# Patient Record
Sex: Female | Born: 1950 | Race: White | Hispanic: No | Marital: Married | Smoking: Never smoker
Health system: Southern US, Community
[De-identification: ages and names within clinical notes are randomized; demographics above are authoritative.]

## PROBLEM LIST (undated history)

## (undated) DIAGNOSIS — F419 Anxiety disorder, unspecified: Secondary | ICD-10-CM

## (undated) DIAGNOSIS — D499 Neoplasm of unspecified behavior of unspecified site: Secondary | ICD-10-CM

## (undated) DIAGNOSIS — G43909 Migraine, unspecified, not intractable, without status migrainosus: Secondary | ICD-10-CM

## (undated) DIAGNOSIS — N200 Calculus of kidney: Secondary | ICD-10-CM

## (undated) DIAGNOSIS — F988 Other specified behavioral and emotional disorders with onset usually occurring in childhood and adolescence: Secondary | ICD-10-CM

## (undated) DIAGNOSIS — E7800 Pure hypercholesterolemia, unspecified: Secondary | ICD-10-CM

## (undated) DIAGNOSIS — E162 Hypoglycemia, unspecified: Secondary | ICD-10-CM

## (undated) DIAGNOSIS — E782 Mixed hyperlipidemia: Secondary | ICD-10-CM

## (undated) DIAGNOSIS — N6019 Diffuse cystic mastopathy of unspecified breast: Secondary | ICD-10-CM

## (undated) DIAGNOSIS — I10 Essential (primary) hypertension: Secondary | ICD-10-CM

## (undated) DIAGNOSIS — Z87442 Personal history of urinary calculi: Secondary | ICD-10-CM

## (undated) DIAGNOSIS — D3A8 Other benign neuroendocrine tumors: Secondary | ICD-10-CM

## (undated) DIAGNOSIS — M8580 Other specified disorders of bone density and structure, unspecified site: Secondary | ICD-10-CM

## (undated) HISTORY — DX: Migraine, unspecified, not intractable, without status migrainosus: G43.909

## (undated) HISTORY — DX: Hypoglycemia, unspecified: E16.2

## (undated) HISTORY — DX: Other specified disorders of bone density and structure, unspecified site: M85.80

## (undated) HISTORY — DX: Mixed hyperlipidemia: E78.2

## (undated) HISTORY — DX: Diffuse cystic mastopathy of unspecified breast: N60.19

## (undated) HISTORY — DX: Calculus of kidney: N20.0

## (undated) HISTORY — PX: LITHOTRIPSY: SUR834

## (undated) HISTORY — DX: Neoplasm of unspecified behavior of unspecified site: D49.9

## (undated) HISTORY — DX: Other specified behavioral and emotional disorders with onset usually occurring in childhood and adolescence: F98.8

## (undated) HISTORY — DX: Essential (primary) hypertension: I10

## (undated) HISTORY — PX: EYE SURGERY: SHX253

## (undated) HISTORY — PX: APPENDECTOMY: SHX54

## (undated) HISTORY — PX: TONSILLECTOMY: SUR1361

## (undated) HISTORY — PX: ABDOMINAL HYSTERECTOMY: SHX81

---

## 2001-02-02 ENCOUNTER — Other Ambulatory Visit: Admission: RE | Admit: 2001-02-02 | Discharge: 2001-02-02 | Payer: Self-pay | Admitting: Family Medicine

## 2002-04-06 ENCOUNTER — Other Ambulatory Visit: Admission: RE | Admit: 2002-04-06 | Discharge: 2002-04-06 | Payer: Self-pay | Admitting: Internal Medicine

## 2002-04-24 LAB — HM MAMMOGRAPHY: HM Mammogram: NORMAL

## 2002-04-25 LAB — FECAL OCCULT BLOOD, GUAIAC: Fecal Occult Blood: NEGATIVE

## 2004-05-11 HISTORY — PX: MASS EXCISION: SHX2000

## 2004-11-02 ENCOUNTER — Ambulatory Visit: Payer: Self-pay | Admitting: Family Medicine

## 2004-11-09 ENCOUNTER — Ambulatory Visit: Payer: Self-pay | Admitting: Unknown Physician Specialty

## 2005-02-08 HISTORY — PX: COLONOSCOPY: SHX174

## 2005-03-11 ENCOUNTER — Encounter (INDEPENDENT_AMBULATORY_CARE_PROVIDER_SITE_OTHER): Payer: Self-pay | Admitting: Internal Medicine

## 2005-03-11 LAB — CONVERTED CEMR LAB

## 2005-04-29 ENCOUNTER — Ambulatory Visit: Payer: Self-pay | Admitting: Family Medicine

## 2005-06-11 LAB — HM COLONOSCOPY: HM Colonoscopy: ABNORMAL

## 2005-06-28 ENCOUNTER — Ambulatory Visit: Payer: Self-pay | Admitting: Unknown Physician Specialty

## 2005-07-06 ENCOUNTER — Ambulatory Visit: Payer: Self-pay | Admitting: Family Medicine

## 2006-02-28 ENCOUNTER — Ambulatory Visit: Payer: Self-pay | Admitting: Family Medicine

## 2006-05-18 ENCOUNTER — Ambulatory Visit: Payer: Self-pay | Admitting: Family Medicine

## 2006-08-22 ENCOUNTER — Ambulatory Visit: Payer: Self-pay | Admitting: Family Medicine

## 2006-10-07 ENCOUNTER — Ambulatory Visit: Payer: Self-pay | Admitting: Family Medicine

## 2007-08-22 ENCOUNTER — Ambulatory Visit: Payer: Self-pay | Admitting: Family Medicine

## 2007-08-22 DIAGNOSIS — F988 Other specified behavioral and emotional disorders with onset usually occurring in childhood and adolescence: Secondary | ICD-10-CM | POA: Insufficient documentation

## 2007-08-22 DIAGNOSIS — I10 Essential (primary) hypertension: Secondary | ICD-10-CM | POA: Insufficient documentation

## 2007-08-22 DIAGNOSIS — E782 Mixed hyperlipidemia: Secondary | ICD-10-CM

## 2007-09-08 ENCOUNTER — Ambulatory Visit: Payer: Self-pay | Admitting: Internal Medicine

## 2007-09-13 LAB — CONVERTED CEMR LAB
ALT: 33 units/L (ref 0–35)
Calcium: 8.8 mg/dL (ref 8.4–10.5)
Chloride: 106 meq/L (ref 96–112)
Direct LDL: 124.9 mg/dL
GFR calc Af Amer: 83 mL/min
GFR calc non Af Amer: 69 mL/min
Sodium: 140 meq/L (ref 135–145)
Total CHOL/HDL Ratio: 4.1
VLDL: 40 mg/dL (ref 0–40)

## 2007-09-14 ENCOUNTER — Encounter (INDEPENDENT_AMBULATORY_CARE_PROVIDER_SITE_OTHER): Payer: Self-pay | Admitting: Internal Medicine

## 2007-09-14 DIAGNOSIS — N6019 Diffuse cystic mastopathy of unspecified breast: Secondary | ICD-10-CM

## 2007-09-14 DIAGNOSIS — E162 Hypoglycemia, unspecified: Secondary | ICD-10-CM

## 2007-09-14 DIAGNOSIS — N200 Calculus of kidney: Secondary | ICD-10-CM

## 2007-09-14 DIAGNOSIS — J309 Allergic rhinitis, unspecified: Secondary | ICD-10-CM

## 2007-09-14 DIAGNOSIS — J45909 Unspecified asthma, uncomplicated: Secondary | ICD-10-CM | POA: Insufficient documentation

## 2007-09-14 DIAGNOSIS — G43909 Migraine, unspecified, not intractable, without status migrainosus: Secondary | ICD-10-CM | POA: Insufficient documentation

## 2007-09-15 ENCOUNTER — Ambulatory Visit: Payer: Self-pay | Admitting: Unknown Physician Specialty

## 2007-09-29 ENCOUNTER — Encounter (INDEPENDENT_AMBULATORY_CARE_PROVIDER_SITE_OTHER): Payer: Self-pay | Admitting: *Deleted

## 2007-10-02 ENCOUNTER — Ambulatory Visit: Payer: Self-pay | Admitting: Family Medicine

## 2007-10-02 ENCOUNTER — Encounter (INDEPENDENT_AMBULATORY_CARE_PROVIDER_SITE_OTHER): Payer: Self-pay | Admitting: Internal Medicine

## 2007-10-02 DIAGNOSIS — M899 Disorder of bone, unspecified: Secondary | ICD-10-CM | POA: Insufficient documentation

## 2007-10-02 DIAGNOSIS — M949 Disorder of cartilage, unspecified: Secondary | ICD-10-CM

## 2007-11-03 ENCOUNTER — Telehealth (INDEPENDENT_AMBULATORY_CARE_PROVIDER_SITE_OTHER): Payer: Self-pay | Admitting: Internal Medicine

## 2007-12-04 ENCOUNTER — Telehealth (INDEPENDENT_AMBULATORY_CARE_PROVIDER_SITE_OTHER): Payer: Self-pay | Admitting: Internal Medicine

## 2007-12-06 ENCOUNTER — Telehealth (INDEPENDENT_AMBULATORY_CARE_PROVIDER_SITE_OTHER): Payer: Self-pay | Admitting: Internal Medicine

## 2007-12-14 ENCOUNTER — Telehealth: Payer: Self-pay | Admitting: Family Medicine

## 2008-01-01 ENCOUNTER — Telehealth (INDEPENDENT_AMBULATORY_CARE_PROVIDER_SITE_OTHER): Payer: Self-pay | Admitting: Internal Medicine

## 2008-01-01 ENCOUNTER — Telehealth: Payer: Self-pay | Admitting: Family Medicine

## 2008-06-26 ENCOUNTER — Ambulatory Visit: Payer: Self-pay | Admitting: Family Medicine

## 2008-06-28 ENCOUNTER — Encounter (INDEPENDENT_AMBULATORY_CARE_PROVIDER_SITE_OTHER): Payer: Self-pay | Admitting: Internal Medicine

## 2008-07-02 ENCOUNTER — Encounter (INDEPENDENT_AMBULATORY_CARE_PROVIDER_SITE_OTHER): Payer: Self-pay | Admitting: Internal Medicine

## 2008-07-25 ENCOUNTER — Ambulatory Visit: Payer: Self-pay | Admitting: Family Medicine

## 2008-08-05 ENCOUNTER — Telehealth: Payer: Self-pay | Admitting: Family Medicine

## 2008-08-28 ENCOUNTER — Telehealth (INDEPENDENT_AMBULATORY_CARE_PROVIDER_SITE_OTHER): Payer: Self-pay | Admitting: Internal Medicine

## 2008-08-28 ENCOUNTER — Ambulatory Visit: Payer: Self-pay | Admitting: Family Medicine

## 2008-08-29 ENCOUNTER — Ambulatory Visit: Payer: Self-pay | Admitting: Family Medicine

## 2008-08-29 LAB — CONVERTED CEMR LAB
ALT: 31 units/L (ref 0–35)
AST: 26 units/L (ref 0–37)
BUN: 15 mg/dL (ref 6–23)
Chloride: 103 meq/L (ref 96–112)
Direct LDL: 111.5 mg/dL
GFR calc non Af Amer: 69 mL/min
Potassium: 4.4 meq/L (ref 3.5–5.1)
Sodium: 141 meq/L (ref 135–145)
VLDL: 41 mg/dL — ABNORMAL HIGH (ref 0–40)

## 2009-04-15 ENCOUNTER — Telehealth (INDEPENDENT_AMBULATORY_CARE_PROVIDER_SITE_OTHER): Payer: Self-pay | Admitting: Internal Medicine

## 2009-04-16 ENCOUNTER — Ambulatory Visit: Payer: Self-pay | Admitting: Internal Medicine

## 2009-04-17 LAB — CONVERTED CEMR LAB: ALT: 28 units/L (ref 0–35)

## 2009-04-18 ENCOUNTER — Telehealth (INDEPENDENT_AMBULATORY_CARE_PROVIDER_SITE_OTHER): Payer: Self-pay | Admitting: Internal Medicine

## 2009-04-18 LAB — CONVERTED CEMR LAB
Direct LDL: 115.5 mg/dL
Total CHOL/HDL Ratio: 4
Triglycerides: 247 mg/dL — ABNORMAL HIGH (ref 0.0–149.0)

## 2009-05-21 ENCOUNTER — Ambulatory Visit: Payer: Self-pay | Admitting: Specialist

## 2009-09-03 ENCOUNTER — Ambulatory Visit: Payer: Self-pay | Admitting: Family Medicine

## 2009-09-08 LAB — CONVERTED CEMR LAB
Cholesterol: 194 mg/dL (ref 0–200)
HDL: 55.7 mg/dL (ref >39.00)
Triglycerides: 215 mg/dL — ABNORMAL HIGH (ref 0.0–149.0)
VLDL: 43 mg/dL — ABNORMAL HIGH (ref 0.0–40.0)

## 2010-03-24 ENCOUNTER — Ambulatory Visit: Payer: Self-pay | Admitting: Family Medicine

## 2010-03-25 LAB — CONVERTED CEMR LAB
ALT: 29 units/L (ref 0–35)
AST: 30 units/L (ref 0–37)
HDL: 60.1 mg/dL (ref >39.00)
Total CHOL/HDL Ratio: 4
Triglycerides: 272 mg/dL — ABNORMAL HIGH (ref 0.0–149.0)
VLDL: 54.4 mg/dL — ABNORMAL HIGH (ref 0.0–40.0)

## 2010-04-08 ENCOUNTER — Ambulatory Visit: Payer: Self-pay | Admitting: Family Medicine

## 2010-05-14 ENCOUNTER — Ambulatory Visit: Payer: Self-pay | Admitting: Unknown Physician Specialty

## 2010-08-13 ENCOUNTER — Ambulatory Visit: Payer: Self-pay | Admitting: Family Medicine

## 2010-08-17 LAB — CONVERTED CEMR LAB
ALT: 29 units/L (ref 0–35)
AST: 29 units/L (ref 0–37)
Total CHOL/HDL Ratio: 4

## 2010-09-08 ENCOUNTER — Encounter: Payer: Self-pay | Admitting: Family Medicine

## 2010-11-10 NOTE — Assessment & Plan Note (Signed)
Summary: 30 MIN APPT F/U LABWORK/CLE   Vital Signs:  Patient profile:   60 year old female Height:      66 inches Weight:      148.25 pounds BMI:     24.01 Temp:     98.1 degrees F oral Pulse rate:   72 / minute Pulse rhythm:   regular BP sitting:   118 / 70  (left arm) Cuff size:   regular  Vitals Entered By: Lewanda Rife LPN (April 08, 2010 3:46 PM) CC: f/u after labs   History of Present Illness: here for f/u after chol labs  cholesterol did go up   wt is stable  good bp at 118/70 today  lipids are higher than usual on crestor with trig 277 and HDL 60 and LDL 131 (up from 110)  eats red meat once per week  no fried food as a rule  occ fast food on off -- sometimes fried fish -- not often  no butter / some cheese / no ice cream  uses promise spread and skim milk  does like shrimp -- eats quite a bit of it  no breakfast meats    long hx of high chol -- was extremely high at age 18  mother and cousin had it  young MIs in family -- some extended relatives i 72s   did use krill oil instead of fish oil for 1 month  was back on regular fish oil for 2 wk before blood test   diet is good overall   some CAD in family    Allergies: 1)  ! Codeine 2)  ! 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 Grandmother had diabetes Mat grandmother died at 76 with coronary artery disease Mat aunts and uncles also had coronary artery disease Breast/Ovarian/Uterine cancer - none Colon cancer + father  some young heart dz in family -- extended   Review of Systems General:  Denies fatigue, loss of appetite, and malaise. Eyes:  Denies eye irritation. CV:  Denies chest pain or discomfort, lightheadness, and palpitations. Resp:  Denies cough and wheezing. GI:  Denies indigestion, loss of appetite, and nausea. MS:  Denies joint pain, muscle aches, and cramps. Derm:  Denies lesion(s), poor wound healing, and rash. Neuro:  Denies numbness and tingling. Endo:  Denies excessive thirst and excessive urination. Heme:  Denies abnormal bruising and bleeding.  Physical Exam  General:  Well-developed,well-nourished,in no acute distress; alert,appropriate and cooperative throughout examination Head:  normocephalic, atraumatic, and no abnormalities observed.   Eyes:  vision grossly intact, pupils equal, pupils round, and pupils reactive to light.   Neck:  supple with full rom and no masses or thyromegally, no JVD or carotid bruit  Lungs:  normal respiratory effort, no  intercostal retractions, no accessory muscle use, and normal breath sounds.   Heart:  normal rate, regular rhythm, and no murmur.   Msk:  No deformity or scoliosis noted of thoracic or lumbar spine.   Extremities:  no edema in either lower leg Neurologic:  gait normal and DTRs symmetrical and normal.   Skin:  Intact without suspicious lesions or rashes Cervical Nodes:  No lymphadenopathy noted Psych:  normal affect, talkative and pleasant    Impression & Recommendations:  Problem # 1:  HYPERLIPIDEMIA (ICD-272.2) Assessment Deteriorated  hx of very high chol that is inc lately on crestor rev diet in detail- suspect too much shellfish and red meat  will change diet - see inst re check 2 mo - if not imp will inc crestor to max of 40 mg and re- eval  also enc exercise  Her updated medication list for this problem includes:    Crestor 20 Mg Tabs (Rosuvastatin calcium) .Marland Kitchen... Take 1 and 1/2 once daily for cholesterol  Labs Reviewed: SGOT: 30 (03/24/2010)   SGPT: 29 (03/24/2010)   HDL:60.10 (03/24/2010), 55.70 (09/03/2009)  LDL:DEL (08/29/2008), DEL (09/08/2007)  Chol:230 (03/24/2010), 194 (09/03/2009)  Trig:272.0 (03/24/2010), 215.0 (09/03/2009)  Complete Medication List: 1)  Vivelle-dot 0.075 Mg/24hr Pttw (Estradiol) .... Use one patch twice a week 2)  Macrobid 100 Mg Caps (Nitrofurantoin monohyd macro) .... As needed 3)  Fish Oil 1200 Mg Caps (Omega-3 fatty acids) .... Take 1 capsule by mouth two times a day 4)  Effexor 75 Mg Tabs (Venlafaxine hcl) .... Take 1 tablet by mouth once a day 5)  Multivitamins Tabs (Multiple vitamin) .... Take one by mouth once a day 6)  Co Q-10 30-5 Mg-unit Caps (Coenzyme q10-vitamin e) .... Take 1 capsule by mouth once a day 7)  Amlodipine Besylate 5 Mg Tabs (Amlodipine besylate) .... Take one by mouth daily 8)  Xalatan 0.005 % Soln (Latanoprost) .... Use one drop in right eye daily 9)  Vit D-3 1000mg   .... Take one by mouth daily 10)  Cetrizine  Hydrochloride 10 Mg  .... Take one by mouth daily 11)  Crestor 20 Mg Tabs (Rosuvastatin calcium) .... Take 1 and 1/2 once daily for cholesterol  Patient Instructions: 1)  you can raise your HDL (good cholesterol) by increasing exercise and eating omega 3 fatty acid supplement like fish oil or flax seed oil over the counter 2)  you can lower LDL (bad cholesterol) by limiting saturated fats in diet like red meat, fried foods, egg yolks, fatty breakfast meats, high fat dairy products and shellfish  3)  stop eating shellfish and cut portions of cheese and try to limit your red meat to once per month  4)  schedule fasting labs in 2-3 months lipid/ast/alt 272  5)  if not improved - will consider inc crestor to 40 mg   Current Allergies (reviewed today): ! CODEINE ! VYTORIN

## 2010-11-10 NOTE — Miscellaneous (Signed)
Summary: flu vaccine  Clinical Lists Changes  Observations: Added new observation of FLU VAX: Historical received at [facility] #[id number] (09/02/2010 11:37)      Immunization History:  Influenza Immunization History:    Influenza:  historical received at [facility] #[id number] (09/02/2010)

## 2011-04-03 ENCOUNTER — Other Ambulatory Visit: Payer: Self-pay | Admitting: Family Medicine

## 2011-04-05 NOTE — Telephone Encounter (Signed)
Patient not seen in one year please advise

## 2011-04-06 ENCOUNTER — Other Ambulatory Visit: Payer: Self-pay | Admitting: Family Medicine

## 2011-04-06 NOTE — Telephone Encounter (Signed)
Go ahead and refil and then make f/u for this summer

## 2011-04-06 NOTE — Telephone Encounter (Signed)
Medication phoned to [facility] as instructed. Addl' note was added for pt to call for f/u appt this summer.

## 2011-04-07 NOTE — Telephone Encounter (Signed)
Spoke with pharmacist at [facility] and they did receive refills sent on 04/03/11. Said to please disregard this request. Not sure why did not go electronically for denial of todays refill.

## 2011-05-10 ENCOUNTER — Other Ambulatory Visit: Payer: Self-pay | Admitting: *Deleted

## 2011-05-10 MED ORDER — ROSUVASTATIN CALCIUM 20 MG PO TABS: ORAL_TABLET | ORAL | Status: DC

## 2011-05-10 NOTE — Telephone Encounter (Signed)
Last refilled 04/12/2011, patient last seen 04/08/2010.  Please advise.

## 2011-05-10 NOTE — Telephone Encounter (Signed)
Patient notified. She was in her car on her cell phone and says that she will call back to schedule appt.;

## 2011-05-10 NOTE — Telephone Encounter (Signed)
Left v/m at pt's home and cell # for pt to call back to schedule appt with Dr Milinda Antis this fall.

## 2011-05-10 NOTE — Telephone Encounter (Signed)
Ok to refil sched appt with me in the fall please  Will send electronically

## 2011-07-06 ENCOUNTER — Other Ambulatory Visit: Payer: Self-pay | Admitting: Family Medicine

## 2011-07-06 NOTE — Telephone Encounter (Signed)
Left message at home # with pt's husband and v/m on pt's cell for pt to call back. Needs to make f/u appt.

## 2011-07-08 ENCOUNTER — Telehealth: Payer: Self-pay | Admitting: *Deleted

## 2011-07-08 MED ORDER — ZOSTER VACCINE LIVE 19400 UNT/0.65ML ~~LOC~~ SOLR: 0.6500 mL | Freq: Once | SUBCUTANEOUS | Status: DC

## 2011-07-08 NOTE — Telephone Encounter (Signed)
Pharmacy requesting order for zostavax injection

## 2011-07-08 NOTE — Telephone Encounter (Signed)
Px written for call in   

## 2011-07-09 NOTE — Telephone Encounter (Signed)
rx called to pharmacy 

## 2011-07-15 ENCOUNTER — Encounter: Payer: Self-pay | Admitting: Family Medicine

## 2011-07-15 NOTE — Telephone Encounter (Signed)
Pt has already scheduled appt 07/19/11 with Dr Milinda Antis.

## 2011-07-16 ENCOUNTER — Encounter: Payer: Self-pay | Admitting: Family Medicine

## 2011-07-19 ENCOUNTER — Encounter: Payer: Self-pay | Admitting: Family Medicine

## 2011-07-19 ENCOUNTER — Ambulatory Visit (INDEPENDENT_AMBULATORY_CARE_PROVIDER_SITE_OTHER): Payer: BC Managed Care – PPO | Admitting: Family Medicine

## 2011-07-19 DIAGNOSIS — J309 Allergic rhinitis, unspecified: Secondary | ICD-10-CM

## 2011-07-19 DIAGNOSIS — E782 Mixed hyperlipidemia: Secondary | ICD-10-CM

## 2011-07-19 DIAGNOSIS — I10 Essential (primary) hypertension: Secondary | ICD-10-CM

## 2011-07-19 MED ORDER — FLUTICASONE PROPIONATE 50 MCG/ACT NA SUSP: 2.0000 | Freq: Every day | NASAL | Status: DC

## 2011-07-19 MED ORDER — ROSUVASTATIN CALCIUM 20 MG PO TABS: ORAL_TABLET | ORAL | Status: DC

## 2011-07-19 MED ORDER — AMLODIPINE BESYLATE 5 MG PO TABS: 5.0000 mg | ORAL_TABLET | Freq: Every day | ORAL | Status: DC

## 2011-07-19 NOTE — Progress Notes (Signed)
Subjective:    Patient ID: Sharon Hester, female    DOB: 06-11-1951, 60 y.o.   MRN: [id number]  HPI Here for f/u of HTN and hyperlipidemia and new problem of ear fullness   Had her flu shot   bp is good 118/70 No cp or palpitations or headache  No change in meds - on norvasc   Lipids are due Have been controlled on crestor - once in a while has a little hip or leg soreness- for few days at a time  Does not last - does not think this is from the med Is very aware of her diet -- watches her sat fats- no red meat or fried foods - is very compliant   Has some ear fullness - both ears but L side worse L nostril is congested also  Going on for 2 weeks  Has allergies this time of year  Is taking her zyrtec  No fever  Clear but thick nasal discharge - perhaps a bit yellow- and feels like she has a cold  Uses afrin at night   Patient Active Problem List  Diagnoses  . HYPOGLYCEMIA  . HYPERLIPIDEMIA  . ADD  . MIGRAINE HEADACHE  . HYPERTENSION  . ALLERGIC RHINITIS  . ASTHMA  . RENAL CALCULUS  . FIBROCYSTIC BREAST DISEASE  . OSTEOPENIA   Past Medical History  Diagnosis Date  . Allergic rhinitis   . Asthma   . Osteopenia   . Attention deficit disorder without mention of hyperactivity   . Diffuse cystic mastopathy   . Mixed hyperlipidemia   . Unspecified essential hypertension   . Hypoglycemia, unspecified   . Migraine, unspecified, without mention of intractable migraine without mention of status migrainosus   . Calculus of kidney   . Precancerous lesion   . Cataract    Past Surgical History  Procedure Date  . Eye surgery     multiple; s/p childhood trauma  . Appendectomy   . Lithotripsy   . Tonsillectomy   . Laparoscopic hysterectomy 1/04  . 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 Colonoscopy 5/06   History  Substance Use Topics  . Smoking status: Never Smoker   . Smokeless tobacco: Not on file  . Alcohol Use: Not on file   Family History  Problem  Relation Age of Onset  . Diabetes Father   . Colon cancer Father   . Hypertension Father   . Heart attack Father   . Heart attack Mother   . Coronary artery disease Mother   . Pancreatic cancer Mother   . Hyperlipidemia Mother   . Other Brother     Congenital hyperspadias  . Other Brother     loss of kidney function in 1 kidney early in life  . Diabetes      Grandmother  . Coronary artery disease Maternal Grandmother   . Coronary artery disease      Maternal aunts and uncles  . Breast cancer Neg Hx   . Ovarian cancer Neg Hx   . Uterine cancer Neg Hx    Allergies  Allergen Reactions  . Codeine     REACTION: n/v  . Ezetimibe-Simvastatin     REACTION: muscle pain   Current Outpatient Prescriptions on File Prior to Visit  Medication Sig Dispense Refill  . cetirizine (ZYRTEC) 10 MG tablet Take 10 mg by mouth daily.        . cholecalciferol (VITAMIN D) 1000 UNITS tablet Take 1,000 Units by mouth daily.        Marland Kitchen  estradiol (VIVELLE-DOT) 0.075 MG/24HR Place 1 patch onto the skin 2 (two) times a week.        . latanoprost (XALATAN) 0.005 % ophthalmic solution Place 1 drop into the right eye daily.        . Multiple Vitamin (MULTIVITAMIN) tablet Take 1 tablet by mouth daily.        . Omega-3 Fatty Acids (FISH OIL) 1200 MG CAPS Take 1 capsule by mouth 2 (two) times daily.        Marland Kitchen venlafaxine (EFFEXOR) 75 MG tablet Take 75 mg by mouth daily.        Marland Kitchen zoster vaccine live, PF, (ZOSTAVAX) 16109 UNT/0.65ML injection Inject 19,400 Units into the skin once.  1 vial  0      Review of Systems Review of Systems  Constitutional: Negative for fever, appetite change, fatigue and unexpected weight change.  Eyes: Negative for pain and visual disturbance.  ENT pos for runny and stuffy nose/ mild post nasal drip / ear fullness with dec hearing, no st , no facial pain  Respiratory: Negative for cough and shortness of breath.   Cardiovascular: Negative for cp or palpitations    Gastrointestinal:  Negative for nausea, diarrhea and constipation.  Genitourinary: Negative for urgency and frequency.  Skin: Negative for pallor or rash   Neurological: Negative for weakness, light-headedness, numbness and headaches.  Hematological: Negative for adenopathy. Does not bruise/bleed easily.  Psychiatric/Behavioral: Negative for dysphoric mood. The patient is not nervous/anxious.          Objective:   Physical Exam  Constitutional: She appears well-developed and well-nourished. No distress.  HENT:  Head: Normocephalic and atraumatic.       TMs dull bilat with some scarring - no redness Nares are boggy/ mildly congested Post nasal clear drainage No sinus tenderness  Eyes: Conjunctivae and EOM are normal. Pupils are equal, round, and reactive to light. Right eye exhibits no discharge. Left eye exhibits no discharge.  Neck: Normal range of motion. Neck supple. No JVD present. Carotid bruit is not present. No thyromegaly present.  Cardiovascular: Normal rate, regular rhythm, normal heart sounds and intact distal pulses.  Exam reveals no gallop.   Pulmonary/Chest: Effort normal and breath sounds normal. No respiratory distress. She has no wheezes.  Abdominal: Soft. Bowel sounds are normal. She exhibits no distension, no abdominal bruit and no mass. There is no tenderness.  Musculoskeletal: She exhibits no edema and no tenderness.  Lymphadenopathy:    She has no cervical adenopathy.  Neurological: She is alert. She has normal reflexes. Coordination normal.  Skin: Skin is warm and dry. No rash noted. No erythema. No pallor.  Psychiatric: She has a normal mood and affect.          Assessment & Plan:

## 2011-07-19 NOTE — Patient Instructions (Signed)
Schedule fasting labs at check out  Stop afrin - do not use it any more Start flonase nasal spray 2 sprays in each nostril each day  Continue zyrtec  No other changes in medicine  Keep up the good health habit

## 2011-07-22 NOTE — Assessment & Plan Note (Signed)
Labs planned fasting  Rev last labs crestor works well Rev low sat fat diet

## 2011-07-22 NOTE — Assessment & Plan Note (Signed)
bp in fair control with norvasc  No changes Labs planned Disc healthy lifestyle habits

## 2011-07-22 NOTE — Assessment & Plan Note (Signed)
More ear congestion and rhinorrhea this season Cannot r/o viral uri also  Add on flonase ns Stop afrin as she is likely having rebound congestion also  Will update if worse or no imp or fever/facial pain

## 2011-10-13 ENCOUNTER — Other Ambulatory Visit: Payer: Self-pay | Admitting: *Deleted

## 2011-10-13 MED ORDER — VENLAFAXINE HCL 75 MG PO TABS: 75.0000 mg | ORAL_TABLET | Freq: Every day | ORAL | Status: DC

## 2011-10-13 NOTE — Telephone Encounter (Signed)
Previously prescribed by Dr. Harold Hedge, but he has retired. Would you be willing to prescribe?

## 2011-10-13 NOTE — Telephone Encounter (Signed)
Refill request from pharmacy. Ok to refill? Last OV 07/19/11

## 2011-10-13 NOTE — Telephone Encounter (Signed)
Will refill electronically  

## 2011-10-19 ENCOUNTER — Telehealth: Payer: Self-pay | Admitting: Internal Medicine

## 2011-10-19 ENCOUNTER — Other Ambulatory Visit: Payer: BC Managed Care – PPO

## 2011-10-19 DIAGNOSIS — R5383 Other fatigue: Secondary | ICD-10-CM | POA: Insufficient documentation

## 2011-10-19 NOTE — Telephone Encounter (Signed)
Patient notified as instructed by telephone. 

## 2011-10-19 NOTE — Telephone Encounter (Signed)
Will add on thyroid labs  Order done

## 2011-10-19 NOTE — Telephone Encounter (Signed)
Patient called and stated she has all the symptoms of hypothyroid.  She has dry skin, memory issues, fatigued all the time, weight gain.  She was watching a program on TV about thyroid and she stated they said when hands hang and if palms point back that's a sign and also doughy features and she states she has fatty tissue.  She is coming in tomorrow for labs because she missed her October appointment to draw labs and she wanted to know if you could do an intensive lab of her thyroid.  Please advise.

## 2011-10-20 ENCOUNTER — Other Ambulatory Visit (INDEPENDENT_AMBULATORY_CARE_PROVIDER_SITE_OTHER): Payer: BC Managed Care – PPO

## 2011-10-20 DIAGNOSIS — R5383 Other fatigue: Secondary | ICD-10-CM

## 2011-10-20 DIAGNOSIS — E782 Mixed hyperlipidemia: Secondary | ICD-10-CM

## 2011-10-20 DIAGNOSIS — I10 Essential (primary) hypertension: Secondary | ICD-10-CM

## 2011-10-20 LAB — COMPREHENSIVE METABOLIC PANEL
AST: 29 U/L (ref 0–37)
Albumin: 4.2 g/dL (ref 3.5–5.2)
BUN: 18 mg/dL (ref 6–23)
Calcium: 8.9 mg/dL (ref 8.4–10.5)
Chloride: 107 mEq/L (ref 96–112)
Creatinine, Ser: 0.9 mg/dL (ref 0.4–1.2)
Glucose, Bld: 93 mg/dL (ref 70–99)
Potassium: 4.6 mEq/L (ref 3.5–5.1)

## 2011-10-20 LAB — CBC WITH DIFFERENTIAL/PLATELET
Basophils Relative: 0.5 % (ref 0.0–3.0)
HCT: 43 % (ref 36.0–46.0)
Hemoglobin: 14.7 g/dL (ref 12.0–15.0)
Lymphocytes Relative: 25.1 % (ref 12.0–46.0)
Lymphs Abs: 1.7 10*3/uL (ref 0.7–4.0)
Monocytes Relative: 5.6 % (ref 3.0–12.0)
Neutro Abs: 4.4 10*3/uL (ref 1.4–7.7)
RBC: 5.05 Mil/uL (ref 3.87–5.11)

## 2011-10-20 LAB — LIPID PANEL
Cholesterol: 208 mg/dL — ABNORMAL HIGH (ref 0–200)
HDL: 63.7 mg/dL (ref >39.00)
Triglycerides: 164 mg/dL — ABNORMAL HIGH (ref 0.0–149.0)

## 2011-10-20 LAB — TSH: TSH: 1.82 u[IU]/mL (ref 0.35–5.50)

## 2011-10-21 LAB — LDL CHOLESTEROL, DIRECT: Direct LDL: 117.1 mg/dL

## 2011-11-02 ENCOUNTER — Other Ambulatory Visit: Payer: Self-pay | Admitting: *Deleted

## 2011-11-02 MED ORDER — ROSUVASTATIN CALCIUM 20 MG PO TABS: ORAL_TABLET | ORAL | Status: DC

## 2012-09-11 ENCOUNTER — Other Ambulatory Visit: Payer: Self-pay | Admitting: *Deleted

## 2012-09-11 NOTE — Telephone Encounter (Signed)
Please schedule a winter follow up and refil until then thanks

## 2012-09-11 NOTE — Telephone Encounter (Signed)
No recent appt or labs and no future appt, ok to refill? 

## 2012-09-12 NOTE — Telephone Encounter (Signed)
Left voicemail requesting pt to call office, will try to call later 

## 2012-09-13 MED ORDER — AMLODIPINE BESYLATE 5 MG PO TABS: 5.0000 mg | ORAL_TABLET | Freq: Every day | ORAL | Status: DC

## 2012-09-13 NOTE — Telephone Encounter (Signed)
Scheduled appt for 09/29/12 and refilled med

## 2012-09-29 ENCOUNTER — Ambulatory Visit: Payer: BC Managed Care – PPO | Admitting: Family Medicine

## 2012-10-06 ENCOUNTER — Encounter: Payer: Self-pay | Admitting: Family Medicine

## 2012-10-06 ENCOUNTER — Ambulatory Visit (INDEPENDENT_AMBULATORY_CARE_PROVIDER_SITE_OTHER): Payer: BC Managed Care – PPO | Admitting: Family Medicine

## 2012-10-06 VITALS — BP 130/76 | HR 80 | Temp 97.8°F | Ht 66.0 in | Wt 149.5 lb

## 2012-10-06 DIAGNOSIS — E782 Mixed hyperlipidemia: Secondary | ICD-10-CM

## 2012-10-06 DIAGNOSIS — I10 Essential (primary) hypertension: Secondary | ICD-10-CM

## 2012-10-06 LAB — COMPREHENSIVE METABOLIC PANEL
AST: 28 U/L (ref 0–37)
Albumin: 4 g/dL (ref 3.5–5.2)
Alkaline Phosphatase: 54 U/L (ref 39–117)
BUN: 16 mg/dL (ref 6–23)
Creatinine, Ser: 0.8 mg/dL (ref 0.4–1.2)
Potassium: 4.2 mEq/L (ref 3.5–5.1)

## 2012-10-06 LAB — LIPID PANEL
Cholesterol: 180 mg/dL (ref 0–200)
Total CHOL/HDL Ratio: 4
Triglycerides: 202 mg/dL — ABNORMAL HIGH (ref 0.0–149.0)
VLDL: 40.4 mg/dL — ABNORMAL HIGH (ref 0.0–40.0)

## 2012-10-06 LAB — CBC WITH DIFFERENTIAL/PLATELET
Basophils Relative: 0.8 % (ref 0.0–3.0)
Eosinophils Absolute: 0.2 10*3/uL (ref 0.0–0.7)
MCHC: 33.7 g/dL (ref 30.0–36.0)
MCV: 84.5 fl (ref 78.0–100.0)
Monocytes Absolute: 0.4 10*3/uL (ref 0.1–1.0)
Neutrophils Relative %: 57.5 % (ref 43.0–77.0)
RBC: 5.14 Mil/uL — ABNORMAL HIGH (ref 3.87–5.11)
RDW: 13.2 % (ref 11.5–14.6)

## 2012-10-06 MED ORDER — PNEUMOCOCCAL VAC POLYVALENT 25 MCG/0.5ML IJ INJ: 0.5000 mL | INJECTION | Freq: Once | INTRAMUSCULAR | Status: DC

## 2012-10-06 MED ORDER — VENLAFAXINE HCL 75 MG PO TABS: 75.0000 mg | ORAL_TABLET | Freq: Every day | ORAL | Status: DC

## 2012-10-06 MED ORDER — AMLODIPINE BESYLATE 5 MG PO TABS: 5.0000 mg | ORAL_TABLET | Freq: Every day | ORAL | Status: DC

## 2012-10-06 MED ORDER — ROSUVASTATIN CALCIUM 20 MG PO TABS: ORAL_TABLET | ORAL | Status: DC

## 2012-10-06 MED ORDER — ESTRADIOL 0.075 MG/24HR TD PTTW
1.0000 | MEDICATED_PATCH | TRANSDERMAL | Status: DC
Start: 2012-10-06 — End: 2012-10-31

## 2012-10-06 NOTE — Progress Notes (Signed)
Subjective:    Patient ID: Sharon Hester, female    DOB: 1951-04-06, 60 y.o.   MRN: [id number]  HPI Here for f/u of chronic medical problems  Feeling fine -no problems   Has [place] back [place] [employer] [place]    Wt is up 2 lb with good bmi of 24  bp is stable today  No cp or palpitations or headaches or edema  No side effects to medicines  BP Readings from Last 3 Encounters:  10/06/12 130/76  07/19/11 118/70  04/08/10 118/70      Hyperlipidemia  On crestor  Lab Results  Component Value Date   CHOL 208* 10/20/2011   HDL 63.70 10/20/2011   LDLDIRECT 117.1 10/20/2011   TRIG 164.0* 10/20/2011   CHOLHDL 3 10/20/2011   due for labs  Diet has been very good for the most part -just wants to watch calories    Health habits- needs to get back to exercise - walking and weights   On effexor - still works well for her   mammo - that was fall of this year   Wants a pneumovax px   Patient Active Problem List  Diagnosis  . HYPOGLYCEMIA  . HYPERLIPIDEMIA  . ADD  . MIGRAINE HEADACHE  . HYPERTENSION  . ALLERGIC RHINITIS  . ASTHMA  . RENAL CALCULUS  . FIBROCYSTIC BREAST DISEASE  . OSTEOPENIA  . Fatigue   Past Medical History  Diagnosis Date  . Allergic rhinitis   . Asthma   . Osteopenia   . Attention deficit disorder without mention of hyperactivity   . Diffuse cystic mastopathy   . Mixed hyperlipidemia   . Unspecified essential hypertension   . Hypoglycemia, unspecified   . Migraine, unspecified, without mention of intractable migraine without mention of status migrainosus   . Calculus of kidney   . Precancerous lesion   . Cataract    Past Surgical History  Procedure Date  . Eye surgery     multiple; s/p childhood trauma  . Appendectomy   . Lithotripsy   . Tonsillectomy   . Laparoscopic hysterectomy 1/04  . 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 Colonoscopy 5/06   History  Substance Use Topics  . Smoking status: Never Smoker   . Smokeless  tobacco: Not on file  . Alcohol Use: Yes     Comment: occasional   Family History  Problem Relation Age of Onset  . Diabetes Father   . Colon cancer Father   . Hypertension Father   . Heart attack Father   . Heart attack Mother   . Coronary artery disease Mother   . Pancreatic cancer Mother   . Hyperlipidemia Mother   . Other Brother     Congenital hyperspadias  . Other Brother     loss of kidney function in 1 kidney early in life  . Diabetes      Grandmother  . Coronary artery disease Maternal Grandmother   . Coronary artery disease      Maternal aunts and uncles  . Breast cancer Neg Hx   . Ovarian cancer Neg Hx   . Uterine cancer Neg Hx    Allergies  Allergen Reactions  . Codeine     REACTION: n/v  . Ezetimibe-Simvastatin     REACTION: muscle pain   Current Outpatient Prescriptions on File Prior to Visit  Medication Sig Dispense Refill  . amLODipine (NORVASC) 5 MG tablet Take 1 tablet (5 mg total) by mouth daily.  90 tablet  0  . cetirizine (ZYRTEC) 10 MG tablet Take 10 mg by mouth daily.        . cholecalciferol (VITAMIN D) 1000 UNITS tablet Take 1,000 Units by mouth daily.        . Coenzyme Q10 (COQ10) 400 MG CAPS Take 1 capsule by mouth daily.        Marland Kitchen estradiol (VIVELLE-DOT) 0.075 MG/24HR Place 1 patch onto the skin 2 (two) times a week.        . latanoprost (XALATAN) 0.005 % ophthalmic solution Place 1 drop into the right eye daily.        . Multiple Vitamin (MULTIVITAMIN) tablet Take 1 tablet by mouth daily.        . Omega-3 Fatty Acids (FISH OIL) 1200 MG CAPS Take 1 capsule by mouth 2 (two) times daily.       . rosuvastatin (CRESTOR) 20 MG tablet Take one and one half tablets by mouth once daily for cholesterol  135 tablet  3  . venlafaxine (EFFEXOR) 75 MG tablet Take 1 tablet (75 mg total) by mouth daily.  30 tablet  5  . fluticasone (FLONASE) 50 MCG/ACT nasal spray Place 2 sprays into the nose daily.  16 g  11      Review of Systems Review of Systems    Constitutional: Negative for fever, appetite change, fatigue and unexpected weight change.  Eyes: Negative for pain and visual disturbance.  Respiratory: Negative for cough and shortness of breath.   Cardiovascular: Negative for cp or palpitations    Gastrointestinal: Negative for nausea, diarrhea and constipation.  Genitourinary: Negative for urgency and frequency.  Skin: Negative for pallor or rash   Neurological: Negative for weakness, light-headedness, numbness and headaches.  Hematological: Negative for adenopathy. Does not bruise/bleed easily.  Psychiatric/Behavioral: Negative for dysphoric mood. The patient is not nervous/anxious.         Objective:   Physical Exam  Constitutional: She appears well-developed and well-nourished. No distress.  HENT:  Head: Normocephalic and atraumatic.  Right Ear: External ear normal.  Left Ear: External ear normal.  Nose: Nose normal.  Mouth/Throat: Oropharynx is clear and moist.  Eyes: Conjunctivae normal and EOM are normal. Pupils are equal, round, and reactive to light. Right eye exhibits no discharge. Left eye exhibits no discharge. No scleral icterus.  Neck: Normal range of motion. Neck supple. No JVD present. Carotid bruit is not present. No thyromegaly present.  Cardiovascular: Normal rate, regular rhythm, normal heart sounds and intact distal pulses.  Exam reveals no gallop.   Pulmonary/Chest: Effort normal and breath sounds normal. No respiratory distress. She has no wheezes.  Abdominal: Soft. Bowel sounds are normal. She exhibits no distension, no abdominal bruit and no mass. There is no tenderness.  Musculoskeletal: She exhibits no edema and no tenderness.  Lymphadenopathy:    She has no cervical adenopathy.  Neurological: She is alert. She has normal reflexes. No cranial nerve deficit. She exhibits normal muscle tone. Coordination normal.  Skin: Skin is warm and dry. No rash noted. No erythema. No pallor.  Psychiatric: She has a  normal mood and affect.          Assessment & Plan:

## 2012-10-06 NOTE — Assessment & Plan Note (Signed)
Lipids today Not fasting crestor and good diet

## 2012-10-06 NOTE — Assessment & Plan Note (Signed)
bp in fair control at this time  No changes needed  Disc lifstyle change with low sodium diet and exercise  Lab today 

## 2012-10-06 NOTE — Patient Instructions (Addendum)
Labs today  I sent px to the pharmacy  Take care of yourself with healthy diet and exercise

## 2012-10-09 ENCOUNTER — Encounter: Payer: Self-pay | Admitting: *Deleted

## 2012-10-31 ENCOUNTER — Telehealth: Payer: Self-pay | Admitting: Family Medicine

## 2012-10-31 MED ORDER — ESTRADIOL 0.1 MG/24HR TD PTTW: 1.0000 | MEDICATED_PATCH | TRANSDERMAL | Status: DC

## 2012-10-31 NOTE — Telephone Encounter (Signed)
Tonnya from [facility] called regarding pt's RX for Vivelle-Dot 0.075mg  patches.  States that pt was previously on Vivette-Dot 0.1mg  patch and wanted to clarify the dose.  I cannot find documentation that pt was on 0.1mg  dose.  Please advise.

## 2012-10-31 NOTE — Telephone Encounter (Signed)
If that is what she says she was on , go ahead and change it - and change in med list also, thanks

## 2012-10-31 NOTE — Telephone Encounter (Signed)
Changed it on med list and sent in a new Rx for vivelle-Dot 0.1 mg

## 2013-06-26 ENCOUNTER — Other Ambulatory Visit: Payer: Self-pay | Admitting: Family Medicine

## 2013-06-26 NOTE — Telephone Encounter (Signed)
Electronic refill request, no recent/future appt., please advise  

## 2013-06-26 NOTE — Telephone Encounter (Signed)
Please schedule f/u or PE in the winter and refill until then

## 2013-06-28 ENCOUNTER — Other Ambulatory Visit: Payer: Self-pay | Admitting: *Deleted

## 2013-06-28 MED ORDER — ESTRADIOL 0.1 MG/24HR TD PTTW: 1.0000 | MEDICATED_PATCH | TRANSDERMAL | Status: DC

## 2013-10-12 ENCOUNTER — Other Ambulatory Visit: Payer: Self-pay | Admitting: Family Medicine

## 2013-10-12 MED ORDER — ROSUVASTATIN CALCIUM 20 MG PO TABS: ORAL_TABLET | ORAL | Status: DC

## 2013-10-12 MED ORDER — FLUTICASONE PROPIONATE 50 MCG/ACT NA SUSP: 2.0000 | Freq: Every day | NASAL | Status: DC

## 2013-10-12 NOTE — Addendum Note (Signed)
Addended by: Tammi Sou on: 10/12/2013 05:15 PM   Modules accepted: Orders

## 2013-10-17 ENCOUNTER — Other Ambulatory Visit: Payer: Self-pay | Admitting: Family Medicine

## 2013-10-17 NOTE — Telephone Encounter (Signed)
Electronic refill request, please advise  

## 2013-10-17 NOTE — Telephone Encounter (Signed)
Please f/u late spring and refill until then, thanks

## 2013-10-18 NOTE — Telephone Encounter (Signed)
Left detailed message on voicemail. One 90 day refill given.

## 2013-10-26 ENCOUNTER — Other Ambulatory Visit: Payer: Self-pay | Admitting: Family Medicine

## 2013-10-26 NOTE — Telephone Encounter (Signed)
Please schedule f/u and refill until then, thanks 

## 2013-10-26 NOTE — Telephone Encounter (Signed)
Electronic refill request, no recent/future appt. Please advise

## 2013-10-26 NOTE — Telephone Encounter (Signed)
Left message requesting family to call office

## 2013-10-29 ENCOUNTER — Other Ambulatory Visit: Payer: Self-pay | Admitting: Family Medicine

## 2013-10-29 NOTE — Telephone Encounter (Signed)
Pt out of town and requested that I call her tomorrow to schedule a f/u appt

## 2013-10-29 NOTE — Telephone Encounter (Signed)
Last office visit 10/06/2012.  Ok to refill?

## 2013-10-29 NOTE — Telephone Encounter (Signed)
Please schedule f/u and refill until then  

## 2013-10-30 NOTE — Telephone Encounter (Signed)
F/u appt scheduled for 11/20/13 and med refilled

## 2013-10-30 NOTE — Telephone Encounter (Signed)
Rx sent to pharmacy already (duplicate request)

## 2013-11-20 ENCOUNTER — Encounter: Payer: Self-pay | Admitting: Family Medicine

## 2013-11-20 ENCOUNTER — Ambulatory Visit (INDEPENDENT_AMBULATORY_CARE_PROVIDER_SITE_OTHER): Payer: BC Managed Care – PPO | Admitting: Family Medicine

## 2013-11-20 VITALS — BP 118/74 | HR 71 | Temp 97.8°F | Ht 66.0 in | Wt 152.5 lb

## 2013-11-20 DIAGNOSIS — M899 Disorder of bone, unspecified: Secondary | ICD-10-CM

## 2013-11-20 DIAGNOSIS — M949 Disorder of cartilage, unspecified: Secondary | ICD-10-CM

## 2013-11-20 DIAGNOSIS — E782 Mixed hyperlipidemia: Secondary | ICD-10-CM

## 2013-11-20 DIAGNOSIS — I10 Essential (primary) hypertension: Secondary | ICD-10-CM

## 2013-11-20 LAB — CBC WITH DIFFERENTIAL/PLATELET
BASOS PCT: 0.5 % (ref 0.0–3.0)
Basophils Absolute: 0 10*3/uL (ref 0.0–0.1)
EOS ABS: 0.2 10*3/uL (ref 0.0–0.7)
EOS PCT: 3 % (ref 0.0–5.0)
HCT: 45.5 % (ref 36.0–46.0)
HEMOGLOBIN: 14.9 g/dL (ref 12.0–15.0)
LYMPHS PCT: 32.1 % (ref 12.0–46.0)
Lymphs Abs: 2.2 10*3/uL (ref 0.7–4.0)
MCHC: 32.8 g/dL (ref 30.0–36.0)
MCV: 85.8 fl (ref 78.0–100.0)
Monocytes Absolute: 0.3 10*3/uL (ref 0.1–1.0)
Monocytes Relative: 4.5 % (ref 3.0–12.0)
NEUTROS ABS: 4.1 10*3/uL (ref 1.4–7.7)
Neutrophils Relative %: 59.9 % (ref 43.0–77.0)
Platelets: 204 10*3/uL (ref 150.0–400.0)
RBC: 5.3 Mil/uL — AB (ref 3.87–5.11)
RDW: 14.1 % (ref 11.5–14.6)
WBC: 6.8 10*3/uL (ref 4.5–10.5)

## 2013-11-20 LAB — COMPREHENSIVE METABOLIC PANEL
ALBUMIN: 4.3 g/dL (ref 3.5–5.2)
ALT: 31 U/L (ref 0–35)
AST: 33 U/L (ref 0–37)
Alkaline Phosphatase: 56 U/L (ref 39–117)
BUN: 20 mg/dL (ref 6–23)
CALCIUM: 10 mg/dL (ref 8.4–10.5)
CHLORIDE: 104 meq/L (ref 96–112)
CO2: 28 meq/L (ref 19–32)
CREATININE: 0.8 mg/dL (ref 0.4–1.2)
GFR: 78.27 mL/min (ref >60.00)
GLUCOSE: 93 mg/dL (ref 70–99)
POTASSIUM: 4.1 meq/L (ref 3.5–5.1)
Sodium: 142 mEq/L (ref 135–145)
Total Bilirubin: 0.8 mg/dL (ref 0.3–1.2)
Total Protein: 7.3 g/dL (ref 6.0–8.3)

## 2013-11-20 LAB — LIPID PANEL
CHOLESTEROL: 225 mg/dL — AB (ref 0–200)
HDL: 61.3 mg/dL (ref >39.00)
TRIGLYCERIDES: 259 mg/dL — AB (ref 0.0–149.0)
Total CHOL/HDL Ratio: 4
VLDL: 51.8 mg/dL — ABNORMAL HIGH (ref 0.0–40.0)

## 2013-11-20 LAB — LDL CHOLESTEROL, DIRECT: Direct LDL: 132.4 mg/dL

## 2013-11-20 LAB — TSH: TSH: 1.51 u[IU]/mL (ref 0.35–5.50)

## 2013-11-20 MED ORDER — ROSUVASTATIN CALCIUM 20 MG PO TABS: ORAL_TABLET | ORAL | Status: DC

## 2013-11-20 MED ORDER — VENLAFAXINE HCL ER 75 MG PO CP24: ORAL_CAPSULE | ORAL | Status: DC

## 2013-11-20 MED ORDER — AMLODIPINE BESYLATE 5 MG PO TABS: ORAL_TABLET | ORAL | Status: DC

## 2013-11-20 MED ORDER — FLUTICASONE PROPIONATE 50 MCG/ACT NA SUSP: 2.0000 | Freq: Every day | NASAL | Status: DC

## 2013-11-20 MED ORDER — ESTRADIOL 0.1 MG/24HR TD PTTW: 1.0000 | MEDICATED_PATCH | TRANSDERMAL | Status: DC

## 2013-11-20 NOTE — Progress Notes (Signed)
Pre-visit discussion using our clinic review tool. No additional management support is needed unless otherwise documented below in the visit note.  

## 2013-11-20 NOTE — Progress Notes (Signed)
Subjective:    Patient ID: Sharon Hester, female    DOB: 08/24/1951, 63 y.o.   MRN: [id number]  HPI Here for f/u of chronic medical problems   Doing well overall   Thinks she passed a kidney stone yesterday -feels better now/ was a very small one   Hips were hurting lately -that is better  occ knee issues   Has recently quit exercising - she twisted her ankle and was out of commission for    Weight is ok - is stable    bp is stable today  No cp or palpitations or headaches or edema  No side effects to medicines  BP Readings from Last 3 Encounters:  11/20/13 118/74  10/06/12 130/76  07/19/11 118/70     Lab Results  Component Value Date   CHOL 180 10/06/2012   HDL 45.50 10/06/2012   LDLDIRECT 107.8 10/06/2012   TRIG 202.0* 10/06/2012   CHOLHDL 4 10/06/2012    No problems with her medicine  Is overdue for her labs   Mood -on effexor  Took it in the past for headaches  In the future she will want to get off of it  Her daughter lost a child - 57 days old after several miscarriages - and it was quite difficult  Due to that stress will stay on it for now   Also wants to stay on her HRT patch for vasomotor menopause symptoms  She understands cancer risks with this - disc pros and cons and risks   Patient Active Problem List   Diagnosis Date Noted  . Fatigue 10/19/2011  . OSTEOPENIA 10/02/2007  . HYPOGLYCEMIA 09/14/2007  . MIGRAINE HEADACHE 09/14/2007  . ALLERGIC RHINITIS 09/14/2007  . ASTHMA 09/14/2007  . RENAL CALCULUS 09/14/2007  . FIBROCYSTIC BREAST DISEASE 09/14/2007  . HYPERLIPIDEMIA 08/22/2007  . ADD 08/22/2007  . HYPERTENSION 08/22/2007   Past Medical History  Diagnosis Date  . Allergic rhinitis   . Asthma   . Osteopenia   . Attention deficit disorder without mention of hyperactivity   . Diffuse cystic mastopathy   . Mixed hyperlipidemia   . Unspecified essential hypertension   . Hypoglycemia, unspecified   . Migraine, unspecified, without  mention of intractable migraine without mention of status migrainosus   . Calculus of kidney   . Precancerous lesion   . Cataract    Past Surgical History  Procedure Laterality Date  . Eye surgery      multiple; s/p childhood trauma  . Appendectomy    . Lithotripsy    . Tonsillectomy    . Laparoscopic hysterectomy  1/04  . 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 Colonoscopy  5/06   History  Substance Use Topics  . Smoking status: Never Smoker   . Smokeless tobacco: Not on file  . Alcohol Use: Yes     Comment: occasional   Family History  Problem Relation Age of Onset  . Diabetes Father   . Colon cancer Father   . Hypertension Father   . Heart attack Father   . Heart attack Mother   . Coronary artery disease Mother   . Pancreatic cancer Mother   . Hyperlipidemia Mother   . Other Brother     Congenital hyperspadias  . Other Brother     loss of kidney function in 1 kidney early in life  . Diabetes      Grandmother  . Coronary artery disease Maternal Grandmother   . Coronary  artery disease      Maternal aunts and uncles  . Breast cancer Neg Hx   . Ovarian cancer Neg Hx   . Uterine cancer Neg Hx    Allergies  Allergen Reactions  . Codeine     REACTION: n/v  . Ezetimibe-Simvastatin     REACTION: muscle pain   Current Outpatient Prescriptions on File Prior to Visit  Medication Sig Dispense Refill  . cetirizine (ZYRTEC) 10 MG tablet Take 10 mg by mouth daily.        . cholecalciferol (VITAMIN D) 1000 UNITS tablet Take 1,000 Units by mouth daily.        . Coenzyme Q10 (COQ10) 400 MG CAPS Take 1 capsule by mouth daily.        . Flaxseed, Linseed, (FLAX SEED OIL) 1000 MG CAPS Take 1 capsule by mouth daily.      Marland Kitchen latanoprost (XALATAN) 0.005 % ophthalmic solution Place 1 drop into the right eye daily.        . Multiple Vitamin (MULTIVITAMIN) tablet Take 1 tablet by mouth daily.        . Omega-3 Fatty Acids (FISH OIL) 1200 MG CAPS Take 1 capsule by mouth 2  (two) times daily.       . pneumococcal 23 valent vaccine (PNEUMOVAX 23) 25 MCG/0.5ML injection Inject 0.5 mLs into the muscle once.  2.5 mL  0  . venlafaxine (EFFEXOR) 75 MG tablet Take 1 tablet (75 mg total) by mouth daily.  90 tablet  3   No current facility-administered medications on file prior to visit.    Review of Systems Review of Systems  Constitutional: Negative for fever, appetite change, fatigue and unexpected weight change.  Eyes: Negative for pain and visual disturbance.  Respiratory: Negative for cough and shortness of breath.   Cardiovascular: Negative for cp or palpitations    Gastrointestinal: Negative for nausea, diarrhea and constipation.  Genitourinary: Negative for urgency and frequency.  Skin: Negative for pallor or rash   Neurological: Negative for weakness, light-headedness, numbness and headaches.  Hematological: Negative for adenopathy. Does not bruise/bleed easily.  Psychiatric/Behavioral: Negative for dysphoric mood. The patient is not nervous/anxious.         Objective:   Physical Exam  Constitutional: She appears well-developed and well-nourished. No distress.  HENT:  Head: Normocephalic and atraumatic.  Right Ear: External ear normal.  Left Ear: External ear normal.  Nose: Nose normal.  Mouth/Throat: Oropharynx is clear and moist.  Eyes: Conjunctivae and EOM are normal. Pupils are equal, round, and reactive to light. Right eye exhibits no discharge. Left eye exhibits no discharge. No scleral icterus.  Neck: Normal range of motion. Neck supple. No JVD present. No thyromegaly present.  Cardiovascular: Normal rate, regular rhythm, normal heart sounds and intact distal pulses.  Exam reveals no gallop.   Pulmonary/Chest: Effort normal and breath sounds normal. No respiratory distress. She has no wheezes. She has no rales.  Abdominal: Soft. Bowel sounds are normal. She exhibits no distension and no mass. There is no tenderness.  Musculoskeletal: She  exhibits no edema and no tenderness.  Lymphadenopathy:    She has no cervical adenopathy.  Neurological: She is alert. She has normal reflexes. No cranial nerve deficit. She exhibits normal muscle tone. Coordination normal.  Skin: Skin is warm and dry. No rash noted. No erythema. No pallor.  Psychiatric: She has a normal mood and affect.          Assessment & Plan:

## 2013-11-20 NOTE — Patient Instructions (Signed)
Work on [employer] and exercise  Take care of yourself  Labs today

## 2013-11-21 ENCOUNTER — Encounter: Payer: Self-pay | Admitting: *Deleted

## 2013-11-21 LAB — VITAMIN D 25 HYDROXY (VIT D DEFICIENCY, FRACTURES): VIT D 25 HYDROXY: 69 ng/mL (ref 30–89)

## 2013-11-21 NOTE — Assessment & Plan Note (Signed)
BP: 118/74 mmHg  bp in fair control at this time  No changes needed Disc lifstyle change with low sodium diet and exercise   Lab today Med refilled

## 2013-11-21 NOTE — Assessment & Plan Note (Signed)
D level with labs today Disc need for calcium/ vitamin D/ wt bearing exercise and bone density test every 2 y to monitor Disc safety/ fracture risk in detail

## 2013-11-21 NOTE — Assessment & Plan Note (Signed)
Disc goals for lipids and reasons to control them Rev labs with pt from last check  Lipid panel today with crestor and diet  Rev low sat fat diet in detail

## 2014-11-26 ENCOUNTER — Other Ambulatory Visit: Payer: Self-pay | Admitting: *Deleted

## 2014-11-26 MED ORDER — ROSUVASTATIN CALCIUM 20 MG PO TABS: ORAL_TABLET | ORAL | Status: DC

## 2014-11-26 NOTE — Telephone Encounter (Signed)
Received faxed from [facility] for refill request. Rx sent to [facility].

## 2015-01-15 ENCOUNTER — Other Ambulatory Visit: Payer: Self-pay | Admitting: Family Medicine

## 2015-03-11 ENCOUNTER — Encounter: Payer: Self-pay | Admitting: Family Medicine

## 2015-03-11 ENCOUNTER — Ambulatory Visit (INDEPENDENT_AMBULATORY_CARE_PROVIDER_SITE_OTHER): Payer: BC Managed Care – PPO | Admitting: Family Medicine

## 2015-03-11 VITALS — BP 130/80 | HR 62 | Temp 97.6°F | Ht 66.0 in | Wt 148.0 lb

## 2015-03-11 DIAGNOSIS — I10 Essential (primary) hypertension: Secondary | ICD-10-CM | POA: Diagnosis not present

## 2015-03-11 DIAGNOSIS — E782 Mixed hyperlipidemia: Secondary | ICD-10-CM | POA: Diagnosis not present

## 2015-03-11 LAB — CBC WITH DIFFERENTIAL/PLATELET
BASOS PCT: 0.6 % (ref 0.0–3.0)
Basophils Absolute: 0 10*3/uL (ref 0.0–0.1)
EOS ABS: 0.2 10*3/uL (ref 0.0–0.7)
Eosinophils Relative: 2.2 % (ref 0.0–5.0)
HEMATOCRIT: 44.5 % (ref 36.0–46.0)
HEMOGLOBIN: 14.9 g/dL (ref 12.0–15.0)
LYMPHS ABS: 2.6 10*3/uL (ref 0.7–4.0)
Lymphocytes Relative: 35.8 % (ref 12.0–46.0)
MCHC: 33.4 g/dL (ref 30.0–36.0)
MCV: 84.3 fl (ref 78.0–100.0)
Monocytes Absolute: 0.4 10*3/uL (ref 0.1–1.0)
Monocytes Relative: 5.8 % (ref 3.0–12.0)
NEUTROS ABS: 4.1 10*3/uL (ref 1.4–7.7)
Neutrophils Relative %: 55.6 % (ref 43.0–77.0)
Platelets: 193 10*3/uL (ref 150.0–400.0)
RBC: 5.28 Mil/uL — ABNORMAL HIGH (ref 3.87–5.11)
RDW: 14.5 % (ref 11.5–15.5)
WBC: 7.4 10*3/uL (ref 4.0–10.5)

## 2015-03-11 LAB — LIPID PANEL
CHOL/HDL RATIO: 4
CHOLESTEROL: 224 mg/dL — AB (ref 0–200)
HDL: 61.9 mg/dL (ref >39.00)
NONHDL: 162.1
Triglycerides: 207 mg/dL — ABNORMAL HIGH (ref 0.0–149.0)
VLDL: 41.4 mg/dL — AB (ref 0.0–40.0)

## 2015-03-11 LAB — COMPREHENSIVE METABOLIC PANEL
ALBUMIN: 4.6 g/dL (ref 3.5–5.2)
ALT: 22 U/L (ref 0–35)
AST: 22 U/L (ref 0–37)
Alkaline Phosphatase: 49 U/L (ref 39–117)
BUN: 22 mg/dL (ref 6–23)
CALCIUM: 9.9 mg/dL (ref 8.4–10.5)
CHLORIDE: 103 meq/L (ref 96–112)
CO2: 28 meq/L (ref 19–32)
Creatinine, Ser: 0.82 mg/dL (ref 0.40–1.20)
GFR: 74.66 mL/min (ref >60.00)
GLUCOSE: 88 mg/dL (ref 70–99)
POTASSIUM: 4.5 meq/L (ref 3.5–5.1)
SODIUM: 137 meq/L (ref 135–145)
TOTAL PROTEIN: 7 g/dL (ref 6.0–8.3)
Total Bilirubin: 0.5 mg/dL (ref 0.2–1.2)

## 2015-03-11 LAB — TSH: TSH: 1.35 u[IU]/mL (ref 0.35–4.50)

## 2015-03-11 LAB — LDL CHOLESTEROL, DIRECT: LDL DIRECT: 116 mg/dL

## 2015-03-11 MED ORDER — AMLODIPINE BESYLATE 5 MG PO TABS: 5.0000 mg | ORAL_TABLET | Freq: Every day | ORAL | Status: DC

## 2015-03-11 MED ORDER — FLUTICASONE PROPIONATE 50 MCG/ACT NA SUSP: 2.0000 | Freq: Every day | NASAL | Status: DC

## 2015-03-11 MED ORDER — VENLAFAXINE HCL ER 75 MG PO CP24: ORAL_CAPSULE | ORAL | Status: DC

## 2015-03-11 MED ORDER — ROSUVASTATIN CALCIUM 20 MG PO TABS: ORAL_TABLET | ORAL | Status: DC

## 2015-03-11 NOTE — Progress Notes (Signed)
Pre visit review using our clinic review tool, if applicable. No additional management support is needed unless otherwise documented below in the visit note. 

## 2015-03-11 NOTE — Patient Instructions (Signed)
Medicines refilled  Continue healthy low fat diet (Avoid red meat/ fried foods/ egg yolks/ fatty breakfast meats/ butter, cheese and high fat dairy/ and shellfish) Lab today  Take care of yourself

## 2015-03-11 NOTE — Assessment & Plan Note (Signed)
bp in fair control at this time  BP Readings from Last 1 Encounters:  03/11/15 130/80   No changes needed Disc lifstyle change with low sodium diet and exercise  Enc healthy habits and more exercise Lab today  Med refilled/ amlodipine

## 2015-03-11 NOTE — Progress Notes (Signed)
Subjective:    Patient ID: Sharon Hester, female    DOB: Jul 25, 1951, 64 y.o.   MRN: [id number]  HPI Here today for f/u of chronic medical problems   Has been taking care of herself   bp is stable today  No cp or palpitations or headaches or edema  No side effects to medicines -amlodipine  BP Readings from Last 3 Encounters:  03/11/15 130/80  11/20/13 118/74  10/06/12 130/76     Wt is down 4 lb with bmi of 23 Eats a healthy diet for the most part  Not as much exercise as she would like   Hx of hyperlipidemia crestor and diet Lab Results  Component Value Date   CHOL 225* 11/20/2013   HDL 61.30 11/20/2013   LDLDIRECT 132.4 11/20/2013   TRIG 259.0* 11/20/2013   CHOLHDL 4 11/20/2013   Due for a check - has eaten light today overall   No leg swelling or cp or headaches   Mood is fairly good  Needs refill of effexor xr   Patient Active Problem List   Diagnosis Date Noted  . OSTEOPENIA 10/02/2007  . HYPOGLYCEMIA 09/14/2007  . MIGRAINE HEADACHE 09/14/2007  . ALLERGIC RHINITIS 09/14/2007  . ASTHMA 09/14/2007  . RENAL CALCULUS 09/14/2007  . FIBROCYSTIC BREAST DISEASE 09/14/2007  . HYPERLIPIDEMIA 08/22/2007  . ADD 08/22/2007  . Essential hypertension 08/22/2007   Past Medical History  Diagnosis Date  . Allergic rhinitis   . Asthma   . Osteopenia   . Attention deficit disorder without mention of hyperactivity   . Diffuse cystic mastopathy   . Mixed hyperlipidemia   . Unspecified essential hypertension   . Hypoglycemia, unspecified   . Migraine, unspecified, without mention of intractable migraine without mention of status migrainosus   . Calculus of kidney   . Precancerous lesion   . Cataract    Past Surgical History  Procedure Laterality Date  . Eye surgery      multiple; s/p childhood trauma  . Appendectomy    . Lithotripsy    . Tonsillectomy    . Laparoscopic hysterectomy  1/04  . 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 Colonoscopy  5/06    History  Substance Use Topics  . Smoking status: Never Smoker   . Smokeless tobacco: Not on file  . Alcohol Use: 0.0 oz/week    0 Standard drinks or equivalent per week     Comment: occasional   Family History  Problem Relation Age of Onset  . Diabetes Father   . Colon cancer Father   . Hypertension Father   . Heart attack Father   . Heart attack Mother   . Coronary artery disease Mother   . Pancreatic cancer Mother   . Hyperlipidemia Mother   . Other Brother     Congenital hyperspadias  . Other Brother     loss of kidney function in 1 kidney early in life  . Diabetes      Grandmother  . Coronary artery disease Maternal Grandmother   . Coronary artery disease      Maternal aunts and uncles  . Breast cancer Neg Hx   . Ovarian cancer Neg Hx   . Uterine cancer Neg Hx    Allergies  Allergen Reactions  . Codeine     REACTION: n/v  . Ezetimibe-Simvastatin     REACTION: muscle pain   Current Outpatient Prescriptions on File Prior to Visit  Medication Sig Dispense Refill  .  cetirizine (ZYRTEC) 10 MG tablet Take 10 mg by mouth daily.      . cholecalciferol (VITAMIN D) 1000 UNITS tablet Take 1,000 Units by mouth daily.      . Coenzyme Q10 (COQ10) 400 MG CAPS Take 1 capsule by mouth daily.      Marland Kitchen estradiol (VIVELLE-DOT) 0.1 MG/24HR patch Place 1 patch (0.1 mg total) onto the skin 2 (two) times a week. 24 patch 3  . Flaxseed, Linseed, (FLAX SEED OIL) 1000 MG CAPS Take 1 capsule by mouth daily.    Marland Kitchen latanoprost (XALATAN) 0.005 % ophthalmic solution Place 1 drop into the right eye daily.      . Multiple Vitamin (MULTIVITAMIN) tablet Take 1 tablet by mouth daily.      . Omega-3 Fatty Acids (FISH OIL) 1200 MG CAPS Take 1 capsule by mouth 2 (two) times daily.      No current facility-administered medications on file prior to visit.     Review of Systems Review of Systems  Constitutional: Negative for fever, appetite change, fatigue and unexpected weight change.  Eyes:  Negative for pain and visual disturbance.  Respiratory: Negative for cough and shortness of breath.   Cardiovascular: Negative for cp or palpitations    Gastrointestinal: Negative for nausea, diarrhea and constipation.  Genitourinary: Negative for urgency and frequency.  Skin: Negative for pallor or rash   Neurological: Negative for weakness, light-headedness, numbness and headaches.  Hematological: Negative for adenopathy. Does not bruise/bleed easily.  Psychiatric/Behavioral: Negative for dysphoric mood. The patient is not nervous/anxious.         Objective:   Physical Exam  Constitutional: She appears well-developed and well-nourished. No distress.  HENT:  Head: Normocephalic and atraumatic.  Mouth/Throat: Oropharynx is clear and moist.  Eyes: Conjunctivae and EOM are normal. Pupils are equal, round, and reactive to light.  Neck: Normal range of motion. Neck supple. No JVD present. Carotid bruit is not present. No thyromegaly present.  Cardiovascular: Normal rate, regular rhythm, normal heart sounds and intact distal pulses.  Exam reveals no gallop.   Pulmonary/Chest: Effort normal and breath sounds normal. No respiratory distress. She has no wheezes. She has no rales.  No crackles  Abdominal: Soft. Bowel sounds are normal. She exhibits no distension, no abdominal bruit and no mass. There is no tenderness.  Musculoskeletal: She exhibits no edema.  Lymphadenopathy:    She has no cervical adenopathy.  Neurological: She is alert. She has normal reflexes.  Skin: Skin is warm and dry. No rash noted.  Psychiatric: She has a normal mood and affect.          Assessment & Plan:   Problem List Items Addressed This Visit    Essential hypertension - Primary    bp in fair control at this time  BP Readings from Last 1 Encounters:  03/11/15 130/80   No changes needed Disc lifstyle change with low sodium diet and exercise  Enc healthy habits and more exercise Lab today  Med  refilled/ amlodipine        Relevant Medications   amLODipine (NORVASC) 5 MG tablet   rosuvastatin (CRESTOR) 20 MG tablet   Other Relevant Orders   CBC with Differential/Platelet   Comprehensive metabolic panel   TSH   Lipid panel   HYPERLIPIDEMIA    Disc goals for lipids and reasons to control them Rev labs with pt from last draw On crestor-refilled  Lab today  Rev low sat fat diet in detail  Relevant Medications   amLODipine (NORVASC) 5 MG tablet   rosuvastatin (CRESTOR) 20 MG tablet   Other Relevant Orders   Lipid panel

## 2015-03-11 NOTE — Assessment & Plan Note (Signed)
Disc goals for lipids and reasons to control them Rev labs with pt from last draw On crestor-refilled  Lab today  Rev low sat fat diet in detail

## 2015-03-12 ENCOUNTER — Encounter: Payer: Self-pay | Admitting: *Deleted

## 2015-05-09 ENCOUNTER — Encounter: Payer: Self-pay | Admitting: *Deleted

## 2015-05-12 ENCOUNTER — Encounter
Admission: RE | Disposition: A | Discharge status: Home / Self Care | Payer: Self-pay | Source: Ambulatory Visit | Attending: Unknown Physician Specialty

## 2015-05-12 ENCOUNTER — Ambulatory Visit: Payer: BC Managed Care – PPO | Admitting: Anesthesiology

## 2015-05-12 ENCOUNTER — Encounter: Payer: Self-pay | Admitting: *Deleted

## 2015-05-12 ENCOUNTER — Ambulatory Visit
Admission: RE | Admit: 2015-05-12 | Discharge: 2015-05-12 | Disposition: A | Discharge status: Home / Self Care | Payer: BC Managed Care – PPO | Source: Ambulatory Visit | Attending: Unknown Physician Specialty | Admitting: Unknown Physician Specialty

## 2015-05-12 DIAGNOSIS — E780 Pure hypercholesterolemia: Secondary | ICD-10-CM | POA: Insufficient documentation

## 2015-05-12 DIAGNOSIS — D123 Benign neoplasm of transverse colon: Secondary | ICD-10-CM | POA: Diagnosis not present

## 2015-05-12 DIAGNOSIS — Z79899 Other long term (current) drug therapy: Secondary | ICD-10-CM | POA: Insufficient documentation

## 2015-05-12 DIAGNOSIS — Z79890 Hormone replacement therapy (postmenopausal): Secondary | ICD-10-CM | POA: Insufficient documentation

## 2015-05-12 DIAGNOSIS — M8580 Other specified disorders of bone density and structure, unspecified site: Secondary | ICD-10-CM | POA: Diagnosis not present

## 2015-05-12 DIAGNOSIS — I10 Essential (primary) hypertension: Secondary | ICD-10-CM | POA: Diagnosis not present

## 2015-05-12 DIAGNOSIS — Z7951 Long term (current) use of inhaled steroids: Secondary | ICD-10-CM | POA: Insufficient documentation

## 2015-05-12 DIAGNOSIS — J45909 Unspecified asthma, uncomplicated: Secondary | ICD-10-CM | POA: Diagnosis not present

## 2015-05-12 DIAGNOSIS — G43909 Migraine, unspecified, not intractable, without status migrainosus: Secondary | ICD-10-CM | POA: Insufficient documentation

## 2015-05-12 DIAGNOSIS — Z86010 Personal history of colonic polyps: Secondary | ICD-10-CM | POA: Diagnosis present

## 2015-05-12 DIAGNOSIS — Z87442 Personal history of urinary calculi: Secondary | ICD-10-CM | POA: Insufficient documentation

## 2015-05-12 DIAGNOSIS — K640 First degree hemorrhoids: Secondary | ICD-10-CM | POA: Insufficient documentation

## 2015-05-12 DIAGNOSIS — E782 Mixed hyperlipidemia: Secondary | ICD-10-CM | POA: Diagnosis not present

## 2015-05-12 DIAGNOSIS — D125 Benign neoplasm of sigmoid colon: Secondary | ICD-10-CM | POA: Insufficient documentation

## 2015-05-12 HISTORY — DX: Pure hypercholesterolemia, unspecified: E78.00

## 2015-05-12 HISTORY — PX: COLONOSCOPY WITH PROPOFOL: SHX5780

## 2015-05-12 SURGERY — COLONOSCOPY WITH PROPOFOL
Anesthesia: General

## 2015-05-12 MED ORDER — SODIUM CHLORIDE 0.9 % IV SOLN: INTRAVENOUS | Status: DC

## 2015-05-12 MED ORDER — PROPOFOL INFUSION 10 MG/ML OPTIME
INTRAVENOUS | Status: DC | PRN
  Administered 2015-05-12: 120 ug/kg/min via INTRAVENOUS

## 2015-05-12 MED ORDER — EPHEDRINE SULFATE 50 MG/ML IJ SOLN
INTRAMUSCULAR | Status: DC | PRN
  Administered 2015-05-12: 10 mg via INTRAVENOUS

## 2015-05-12 MED ORDER — MIDAZOLAM HCL 2 MG/2ML IJ SOLN
INTRAMUSCULAR | Status: DC | PRN
  Administered 2015-05-12: 1 mg via INTRAVENOUS

## 2015-05-12 MED ORDER — FENTANYL CITRATE (PF) 100 MCG/2ML IJ SOLN
INTRAMUSCULAR | Status: DC | PRN
  Administered 2015-05-12: 50 ug via INTRAVENOUS

## 2015-05-12 MED ORDER — SODIUM CHLORIDE 0.9 % IV SOLN
INTRAVENOUS | Status: DC
  Administered 2015-05-12 (×2): via INTRAVENOUS

## 2015-05-12 NOTE — Op Note (Addendum)
[facility] Gastroenterology Patient Name: Sharon Hester Procedure Date: 05/12/2015 3:19 PM MRN: [id number] Account #: [id number] Date of Birth: Nov 16, 1950 Admit Type: Outpatient Age: 64 Room: [facility] ENDO ROOM 1 Gender: Female Note Status: Supervisor Override Procedure:         Colonoscopy Indications:       Personal history of colonic polyps Providers:         Manya Silvas, MD Referring MD:      Wynelle Fanny. Tower, MD (Referring MD) Medicines:         Propofol per Anesthesia Complications:     No immediate complications. Procedure:         Pre-Anesthesia Assessment:                    - After reviewing the risks and benefits, the patient was                     deemed in satisfactory condition to undergo the procedure.                    After obtaining informed consent, the colonoscope was                     passed under direct vision. Throughout the procedure, the                     patient's blood pressure, pulse, and oxygen saturations                     were monitored continuously. The Colonoscope was                     introduced through the anus and advanced to the the cecum,                     identified by appendiceal orifice and ileocecal valve. The                     colonoscopy was somewhat difficult due to a tortuous                     colon. Successful completion of the procedure was aided by                     applying abdominal pressure. The patient tolerated the                     procedure well. The quality of the bowel preparation was                     excellent. Findings:      Four sessile polyps were found in the sigmoid colon and at the hepatic       flexure. The polyps were diminutive in size. These polyps were removed       with a jumbo cold forceps. Resection and retrieval were complete.      Internal hemorrhoids were found during endoscopy. The hemorrhoids were       small and Grade I (internal hemorrhoids that do not prolapse).     The exam was otherwise without abnormality. Impression:        - Four diminutive polyps in the sigmoid colon and at the  hepatic flexure. Resected and retrieved.                    - Internal hemorrhoids.                    - The examination was otherwise normal. Recommendation:    - Await pathology results. Manya Silvas, MD 05/12/2015 3:56:15 PM This report has been signed electronically. Number of Addenda: 0 Note Initiated On: 05/12/2015 3:19 PM Scope Withdrawal Time: 0 hours 12 minutes 9 seconds  Total Procedure Duration: 0 hours 24 minutes 0 seconds       [facility]

## 2015-05-12 NOTE — Anesthesia Postprocedure Evaluation (Signed)
  Anesthesia Post-op Note  Patient: Sharon Hester  Procedure(s) Performed: Procedure(s): COLONOSCOPY WITH PROPOFOL (N/A)  Anesthesia type:General  Patient location: PACU  Post pain: Pain level controlled  Post assessment: Post-op Vital signs reviewed, Patient's Cardiovascular Status Stable, Respiratory Function Stable, Patent Airway and No signs of Nausea or vomiting  Post vital signs: Reviewed and stable  Last Vitals:  Filed Vitals:   05/12/15 1629  BP: 138/82  Pulse: 81  Temp:   Resp:     Level of consciousness: awake, alert  and patient cooperative  Complications: No apparent anesthesia complications

## 2015-05-12 NOTE — Anesthesia Procedure Notes (Signed)
Performed by: COOK-MARTIN, Cleve Paolillo Pre-anesthesia Checklist: Patient identified, Emergency Drugs available, Suction available, Patient being monitored and Timeout performed Patient Re-evaluated:Patient Re-evaluated prior to inductionOxygen Delivery Method: Nasal cannula Preoxygenation: Pre-oxygenation with 100% oxygen Intubation Type: IV induction Placement Confirmation: positive ETCO2 and CO2 detector       

## 2015-05-12 NOTE — H&P (Signed)
Primary Care Physician:  Loura Pardon, MD Primary Gastroenterologist:  Dr. Vira Agar  Pre-Procedure History & Physical: HPI:  Sharon Hester is a 64 y.o. female is here for an colonoscopy.   Past Medical History  Diagnosis Date  . Allergic rhinitis   . Asthma   . Osteopenia   . Attention deficit disorder without mention of hyperactivity   . Diffuse cystic mastopathy   . Mixed hyperlipidemia   . Unspecified essential hypertension   . Hypoglycemia, unspecified   . Migraine, unspecified, without mention of intractable migraine without mention of status migrainosus   . Calculus of kidney   . Precancerous lesion   . Cataract   . High cholesterol     Past Surgical History  Procedure Laterality Date  . Eye surgery      multiple; s/p childhood trauma  . Appendectomy    . Lithotripsy    . Tonsillectomy    . Laparoscopic hysterectomy  1/04  . 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 Colonoscopy  5/06    Prior to Admission medications   Medication Sig Start Date End Date Taking? Authorizing Provider  amLODipine (NORVASC) 5 MG tablet Take 1 tablet (5 mg total) by mouth daily. 03/11/15  Yes Abner Greenspan, MD  cetirizine (ZYRTEC) 10 MG tablet Take 10 mg by mouth daily.     Yes Historical Provider, MD  cholecalciferol (VITAMIN D) 1000 UNITS tablet Take 1,000 Units by mouth daily.     Yes Historical Provider, MD  Coenzyme Q10 (COQ10) 400 MG CAPS Take 1 capsule by mouth daily.     Yes Historical Provider, MD  estradiol (VIVELLE-DOT) 0.1 MG/24HR patch Place 1 patch (0.1 mg total) onto the skin 2 (two) times a week. 11/20/13  Yes [place], MD  Flaxseed, Linseed, (FLAX SEED OIL) 1000 MG CAPS Take 1 capsule by mouth daily.   Yes Historical Provider, MD  fluticasone (FLONASE) 50 MCG/ACT nasal spray Place 2 sprays into both nostrils daily. Follow up required before future refills are given 03/11/15 03/10/16 Yes Marne A Tower, MD  latanoprost (XALATAN) 0.005 % ophthalmic solution Place 1  drop into the right eye daily.     Yes Historical Provider, MD  Multiple Vitamin (MULTIVITAMIN) tablet Take 1 tablet by mouth daily.     Yes Historical Provider, MD  nitrofurantoin, macrocrystal-monohydrate, (MACROBID) 100 MG capsule Take 100 mg by mouth 2 (two) times daily as needed.   Yes Historical Provider, MD  Omega-3 Fatty Acids (FISH OIL) 1200 MG CAPS Take 1 capsule by mouth 2 (two) times daily.    Yes Historical Provider, MD  rosuvastatin (CRESTOR) 20 MG tablet Take one and one half tablets by mouth once daily for cholesterol 03/11/15  Yes Abner Greenspan, MD  venlafaxine XR (EFFEXOR-XR) 75 MG 24 hr capsule take 1 capsule by mouth once daily 03/11/15  Yes Abner Greenspan, MD    Allergies as of 04/15/2015 - Review Complete 03/11/2015  Allergen Reaction Noted  . Codeine    . Ezetimibe-simvastatin      Family History  Problem Relation Age of Onset  . Diabetes Father   . Colon cancer Father   . Hypertension Father   . Heart attack Father   . Heart attack Mother   . Coronary artery disease Mother   . Pancreatic cancer Mother   . Hyperlipidemia Mother   . Other Brother     Congenital hyperspadias  . Other Brother     loss  of kidney function in 1 kidney early in life  . Diabetes      Grandmother  . Coronary artery disease Maternal Grandmother   . Coronary artery disease      Maternal aunts and uncles  . Breast cancer Neg Hx   . Ovarian cancer Neg Hx   . Uterine cancer Neg Hx     History   Social History  . Marital Status: Married    Spouse Name: N/A  . Number of Children: 2  . Years of Education: N/A   Occupational History  . Retired    Social History Main Topics  . Smoking status: Never Smoker   . Smokeless tobacco: Never Used  . Alcohol Use: 0.6 oz/week    0 Standard drinks or equivalent, 1 Shots of liquor per week     Comment: occasional  . Drug Use: No  . Sexual Activity: Not on file   Other Topics Concern  . 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 Neck:  Supple; no masses or thyromegaly. Lungs:  Clear throughout to auscultation.    Heart:  Regular rate and rhythm. Abdomen:  Soft, nontender and nondistended. Normal bowel sounds, without guarding, and without rebound.   Neurologic:  Alert and  oriented x4;  grossly normal neurologically.  Impression/Plan: KAHLEA Hester is here for an colonoscopy to be performed for personal hx colon polyps  Risks, benefits, limitations, and alternatives regarding  colonoscopy have been reviewed with the patient.  Questions have been answered.  All parties agreeable.   [name], MD  05/12/2015, 4:01 PM   Primary Care Physician:  Loura Pardon, MD Primary Gastroenterologist:  Dr. Vira Agar  Pre-Procedure History & Physical: HPI:  Sharon Hester is a 64 y.o. female is here for an colonoscopy.   Past Medical History  Diagnosis Date  . Allergic rhinitis   . Asthma   . Osteopenia   . Attention deficit disorder without mention of hyperactivity   . Diffuse cystic mastopathy   . Mixed hyperlipidemia   . Unspecified essential hypertension   . Hypoglycemia, unspecified   . Migraine, unspecified, without mention of intractable migraine without mention of status migrainosus   . Calculus of kidney   . Precancerous lesion   . Cataract   . High cholesterol     Past Surgical History  Procedure Laterality Date  . Eye surgery      multiple; s/p childhood trauma  . Appendectomy    . Lithotripsy    . Tonsillectomy    . Laparoscopic hysterectomy  1/04  . Mass excision   8/05    rectal--villusadenoma  . Colonoscopy  5/06    Prior to Admission medications   Medication Sig Start Date End Date Taking? Authorizing Provider  amLODipine (NORVASC) 5 MG tablet Take 1 tablet (5 mg total) by mouth daily. 03/11/15  Yes Abner Greenspan, MD  cetirizine (ZYRTEC) 10  MG tablet Take 10 mg by mouth daily.     Yes Historical Provider, MD  cholecalciferol (VITAMIN D) 1000 UNITS tablet Take 1,000 Units by mouth daily.     Yes Historical Provider, MD  Coenzyme Q10 (COQ10) 400 MG CAPS Take 1 capsule by mouth daily.     Yes Historical Provider, MD  estradiol (VIVELLE-DOT) 0.1 MG/24HR patch Place 1 patch (0.1 mg total) onto the skin 2 (two) times a week. 11/20/13  Yes Dodge, MD  Flaxseed, Linseed, (FLAX SEED OIL) 1000 MG CAPS Take 1 capsule by mouth daily.   Yes Historical Provider, MD  fluticasone (FLONASE) 50 MCG/ACT nasal spray Place 2 sprays into both nostrils daily. Follow up required before future refills are given 03/11/15 03/10/16 Yes Marne A Tower, MD  latanoprost (XALATAN) 0.005 % ophthalmic solution Place 1 drop into the right eye daily.     Yes Historical Provider, MD  Multiple Vitamin (MULTIVITAMIN) tablet Take 1 tablet by mouth daily.     Yes Historical Provider, MD  nitrofurantoin, macrocrystal-monohydrate, (MACROBID) 100 MG capsule Take 100 mg by mouth 2 (two) times daily as needed.   Yes Historical Provider, MD  Omega-3 Fatty Acids (FISH OIL) 1200 MG CAPS Take 1 capsule by mouth 2 (two) times daily.    Yes Historical Provider, MD  rosuvastatin (CRESTOR) 20 MG tablet Take one and one half tablets by mouth once daily for cholesterol 03/11/15  Yes Abner Greenspan, MD  venlafaxine XR (EFFEXOR-XR) 75 MG 24 hr capsule take 1 capsule by mouth once daily 03/11/15  Yes Abner Greenspan, MD    Allergies as of 04/15/2015 - Review Complete 03/11/2015  Allergen Reaction Noted  . Codeine    . Ezetimibe-simvastatin      Family History  Problem Relation Age of Onset  . Diabetes  Father   . Colon cancer Father   . Hypertension Father   . Heart attack Father   . Heart attack Mother   . Coronary artery disease Mother   . Pancreatic cancer Mother   . Hyperlipidemia Mother   . Other Brother     Congenital hyperspadias  . Other Brother     loss of kidney function in 1 kidney early in life  . Diabetes      Grandmother  . Coronary artery disease Maternal Grandmother   . Coronary artery disease      Maternal aunts and uncles  . Breast cancer Neg Hx   . Ovarian cancer Neg Hx   . Uterine cancer Neg Hx     History   Social History  . Marital Status: Married    Spouse Name: N/A  . Number of Children: 2  . Years of Education: N/A   Occupational History  . Retired    Social History Main Topics  . Smoking status: Never Smoker   . Smokeless tobacco: Never Used  . Alcohol Use: 0.6 oz/week    0 Standard drinks or equivalent, 1 Shots of liquor per week     Comment: occasional  . Drug Use: No  . Sexual Activity: Not on file   Other Topics Concern  . 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 Neck:  Supple; no masses or thyromegaly. Lungs:  Clear throughout to auscultation.    Heart:  Regular rate and rhythm. Abdomen:  Soft, nontender and nondistended. Normal bowel sounds, without guarding, and without rebound.   Neurologic:  Alert and  oriented x4;  grossly normal neurologically.  Impression/Plan: Sharon Hester is here for an colonoscopy to be performed for [facility] colon polyps  Risks, benefits, limitations, and alternatives  regarding  colonoscopy have been reviewed with the patient.  Questions have been answered.  All parties agreeable.   [name], MD  05/12/2015, 4:01 PM

## 2015-05-12 NOTE — Anesthesia Preprocedure Evaluation (Signed)
Anesthesia Evaluation  Patient identified by MRN, date of birth, ID band Patient awake    Reviewed: Allergy & Precautions, H&P , NPO status , Patient's Chart, lab work & pertinent test results, reviewed documented beta blocker date and time   Airway Mallampati: III  TM Distance: >3 FB Neck ROM: limited    Dental no notable dental hx. (+) Teeth Intact   Pulmonary asthma ,  breath sounds clear to auscultation  Pulmonary exam normal       Cardiovascular Exercise Tolerance: Good hypertension, Normal cardiovascular examRhythm:regular Rate:Normal     Neuro/Psych  Headaches, PSYCHIATRIC DISORDERS    GI/Hepatic negative GI ROS, Neg liver ROS,   Endo/Other  negative endocrine ROS  Renal/GU Renal disease  negative genitourinary   Musculoskeletal negative musculoskeletal ROS (+)   Abdominal   Peds negative pediatric ROS (+)  Hematology negative hematology ROS (+)   Anesthesia Other Findings Past Medical History:   Allergic rhinitis                                            Asthma                                                       Osteopenia                                                   Attention deficit disorder without mention of *              Diffuse cystic mastopathy                                    Mixed hyperlipidemia                                         Unspecified essential hypertension                           Hypoglycemia, unspecified                                    Migraine, unspecified, without mention of intr*              Calculus of kidney                                           Precancerous lesion                                          Cataract  High cholesterol                                             False left eye  Reproductive/Obstetrics negative OB ROS                             Anesthesia  Physical Anesthesia Plan  ASA: III  Anesthesia Plan: General   Post-op Pain Management:    Induction:   Airway Management Planned:   Additional Equipment:   Intra-op Plan:   Post-operative Plan:   Informed Consent: I have reviewed the patients History and Physical, chart, labs and discussed the procedure including the risks, benefits and alternatives for the proposed anesthesia with the patient or authorized representative who has indicated his/her understanding and acceptance.   Dental Advisory Given  Plan Discussed with: Anesthesiologist, CRNA and Surgeon  Anesthesia Plan Comments:         Anesthesia Quick Evaluation

## 2015-05-12 NOTE — Transfer of Care (Signed)
Immediate Anesthesia Transfer of Care Note  Patient: Sharon Hester  Procedure(s) Performed: Procedure(s): COLONOSCOPY WITH PROPOFOL (N/A)  Patient Location: PACU  Anesthesia Type:General  Level of Consciousness: awake and sedated  Airway & Oxygen Therapy: Patient Spontanous Breathing and Patient connected to nasal cannula oxygen  Post-op Assessment: Report given to RN  Post vital signs: Reviewed and stable  Last Vitals:  Filed Vitals:   05/12/15 1436  BP: 146/90  Pulse: 77  Temp: 37.5 C  Resp: 16    Complications: No apparent anesthesia complications

## 2015-05-14 LAB — SURGICAL PATHOLOGY

## 2015-05-15 ENCOUNTER — Encounter: Payer: Self-pay | Admitting: Unknown Physician Specialty

## 2015-07-22 LAB — HM MAMMOGRAPHY: HM MAMMO: NORMAL

## 2015-08-11 ENCOUNTER — Encounter: Payer: Self-pay | Admitting: Family Medicine

## 2015-10-27 ENCOUNTER — Encounter: Payer: Self-pay | Admitting: Family Medicine

## 2015-10-27 ENCOUNTER — Ambulatory Visit (INDEPENDENT_AMBULATORY_CARE_PROVIDER_SITE_OTHER): Payer: BC Managed Care – PPO | Admitting: Family Medicine

## 2015-10-27 VITALS — BP 138/86 | HR 74 | Temp 97.3°F | Ht 66.0 in | Wt 151.0 lb

## 2015-10-27 DIAGNOSIS — J0100 Acute maxillary sinusitis, unspecified: Secondary | ICD-10-CM | POA: Diagnosis not present

## 2015-10-27 DIAGNOSIS — J019 Acute sinusitis, unspecified: Secondary | ICD-10-CM | POA: Insufficient documentation

## 2015-10-27 MED ORDER — AMOXICILLIN-POT CLAVULANATE 875-125 MG PO TABS: 1.0000 | ORAL_TABLET | Freq: Two times a day (BID) | ORAL | Status: DC

## 2015-10-27 NOTE — Patient Instructions (Signed)
I think you have a sinus infection  Drink lots of fluids and rest  Try nasal saline spray if you can tolerate it  Use warm compresses on your face  Breathe steam for congestion Take augmentin as directed

## 2015-10-27 NOTE — Assessment & Plan Note (Signed)
S/p 3 wk uri  I think you have a sinus infection  Drink lots of fluids and rest  Try nasal saline spray if you can tolerate it  Use warm compresses on your face  Breathe steam for congestion Take augmentin as directed

## 2015-10-27 NOTE — Progress Notes (Signed)
Subjective:    Patient ID: Sharon Hester, female    DOB: 12-01-1950, 65 y.o.   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 OSTEOPENIA 10/02/2007  . HYPOGLYCEMIA 09/14/2007  . MIGRAINE HEADACHE 09/14/2007  . ALLERGIC RHINITIS 09/14/2007  . ASTHMA 09/14/2007  . RENAL CALCULUS 09/14/2007  . FIBROCYSTIC BREAST DISEASE 09/14/2007  . HYPERLIPIDEMIA 08/22/2007  . ADD 08/22/2007  . Essential hypertension 08/22/2007   Past Medical History  Diagnosis Date  . Allergic rhinitis   . Asthma   . Osteopenia   . Attention deficit disorder without mention of hyperactivity   . Diffuse cystic mastopathy   . Mixed hyperlipidemia   . Unspecified essential hypertension   . Hypoglycemia, unspecified   . Migraine, unspecified, without mention of intractable migraine without mention of status migrainosus   . Calculus of kidney   . Precancerous lesion   . Cataract   . High cholesterol    Past Surgical History  Procedure Laterality Date  . Eye surgery      multiple; s/p childhood trauma  . Appendectomy    . Lithotripsy    . Tonsillectomy    . Laparoscopic hysterectomy  1/04  . 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 Colonoscopy  5/06  . Colonoscopy with propofol N/A 05/12/2015    Procedure: COLONOSCOPY WITH PROPOFOL;  Surgeon: Manya Silvas, MD;  Location[facility] ENDOSCOPY;  Service: Endoscopy;  Laterality: N/A;   Social History  Substance Use  Topics  . Smoking status: Never Smoker   . Smokeless tobacco: Never Used  . Alcohol Use: 0.6 oz/week    0 Standard drinks or equivalent, 1 Shots of liquor per week     Comment: occasional   Family History  Problem Relation Age of Onset  . Diabetes Father   . Colon cancer Father   . Hypertension Father   . Heart attack Father   . Heart attack Mother   . Coronary artery disease Mother   . Pancreatic cancer Mother   . Hyperlipidemia Mother   . Other Brother     Congenital hyperspadias  . Other Brother     loss of kidney function in 1 kidney early in life  . Diabetes      Grandmother  . Coronary artery disease Maternal Grandmother   . Coronary artery disease      Maternal aunts and uncles  . Breast cancer Neg Hx   . Ovarian cancer Neg Hx   . Uterine cancer Neg Hx    Allergies  Allergen Reactions  . Codeine     REACTION: n/v   Current Outpatient Prescriptions on File Prior to Visit  Medication Sig Dispense Refill  . amLODipine (NORVASC) 5 MG tablet Take 1 tablet (5 mg total) by mouth daily.  90 tablet 3  . cetirizine (ZYRTEC) 10 MG tablet Take 10 mg by mouth daily.      . cholecalciferol (VITAMIN D) 1000 UNITS tablet Take 1,000 Units by mouth daily.      . Coenzyme Q10 (COQ10) 400 MG CAPS Take 1 capsule by mouth daily.      Marland Kitchen estradiol (VIVELLE-DOT) 0.1 MG/24HR patch Place 1 patch (0.1 mg total) onto the skin 2 (two) times a week. 24 patch 3  . Flaxseed, Linseed, (FLAX SEED OIL) 1000 MG CAPS Take 1 capsule by mouth daily.    . fluticasone (FLONASE) 50 MCG/ACT nasal spray Place 2 sprays into both nostrils daily. Follow up required before future refills are given 48 g 3  . latanoprost (XALATAN) 0.005 % ophthalmic solution Place 1 drop into the right eye daily.      . Multiple Vitamin (MULTIVITAMIN) tablet Take 1 tablet by mouth daily.      . nitrofurantoin, macrocrystal-monohydrate, (MACROBID) 100 MG capsule Take 100 mg by mouth 2 (two) times daily as needed.    . Omega-3 Fatty  Acids (FISH OIL) 1200 MG CAPS Take 1 capsule by mouth 2 (two) times daily.     . rosuvastatin (CRESTOR) 20 MG tablet Take one and one half tablets by mouth once daily for cholesterol 135 tablet 3  . venlafaxine XR (EFFEXOR-XR) 75 MG 24 hr capsule take 1 capsule by mouth once daily 90 capsule 3   No current facility-administered medications on file prior to visit.      Review of Systems  Constitutional: Positive for appetite change. Negative for fever and fatigue.  HENT: Positive for congestion, ear pain, postnasal drip, rhinorrhea, sinus pressure and sore throat. Negative for nosebleeds.   Eyes: Negative for pain, redness and itching.  Respiratory: Negative for cough, shortness of breath and wheezing.   Cardiovascular: Negative for chest pain.  Gastrointestinal: Negative for nausea, vomiting, abdominal pain and diarrhea.  Endocrine: Negative for polyuria.  Genitourinary: Negative for dysuria, urgency and frequency.  Musculoskeletal: Negative for myalgias and arthralgias.  Allergic/Immunologic: Negative for immunocompromised state.  Neurological: Positive for headaches. Negative for dizziness, tremors, syncope, weakness and numbness.  Hematological: Negative for adenopathy. Does not bruise/bleed easily.  Psychiatric/Behavioral: Negative for dysphoric mood. The patient is not nervous/anxious.        Objective:   Physical Exam  Constitutional: She appears well-developed and well-nourished. No distress.  Well appearing   HENT:  Head: Normocephalic and atraumatic.  Right Ear: External ear normal.  Left Ear: External ear normal.  Mouth/Throat: Oropharynx is clear and moist. No oropharyngeal exudate.  Nares are injected and congested  Bilateral maxillary sinus tenderness (marked) Post nasal drip   Eyes: Conjunctivae and EOM are normal. Pupils are equal, round, and reactive to light. Right eye exhibits no discharge. Left eye exhibits no discharge.  Neck: Normal range of motion. Neck  supple.  Cardiovascular: Normal rate and regular rhythm.   Pulmonary/Chest: Effort normal and breath sounds normal. No respiratory distress. She has no wheezes. She has no rales.  Lymphadenopathy:    She has no cervical adenopathy.  Neurological: She is alert. No cranial nerve deficit.  Skin: Skin is warm and dry. No rash noted.  Psychiatric: She has a normal mood and affect.          Assessment & Plan:   Problem List Items Addressed This Visit      Respiratory   Acute sinusitis - Primary    S/p 3 wk uri  I think  you have a sinus infection  Drink lots of fluids and rest  Try nasal saline spray if you can tolerate it  Use warm compresses on your face  Breathe steam for congestion Take augmentin as directed       Relevant Medications   amoxicillin-clavulanate (AUGMENTIN) 875-125 MG tablet

## 2015-10-27 NOTE — Progress Notes (Signed)
Pre visit review using our clinic review tool, if applicable. No additional management support is needed unless otherwise documented below in the visit note. 

## 2015-12-11 ENCOUNTER — Ambulatory Visit (INDEPENDENT_AMBULATORY_CARE_PROVIDER_SITE_OTHER): Payer: BC Managed Care – PPO | Admitting: Internal Medicine

## 2015-12-11 ENCOUNTER — Encounter: Payer: Self-pay | Admitting: Internal Medicine

## 2015-12-11 VITALS — BP 130/80 | HR 74 | Temp 97.5°F | Wt 149.0 lb

## 2015-12-11 DIAGNOSIS — J309 Allergic rhinitis, unspecified: Secondary | ICD-10-CM | POA: Diagnosis not present

## 2015-12-11 MED ORDER — METHYLPREDNISOLONE ACETATE 80 MG/ML IJ SUSP
80.0000 mg | Freq: Once | INTRAMUSCULAR | Status: AC
  Administered 2015-12-11: 80 mg via INTRAMUSCULAR

## 2015-12-11 MED ORDER — GUAIFENESIN-CODEINE 200-20 MG/5ML PO LIQD: 5.0000 mL | Freq: Every evening | ORAL | Status: DC | PRN

## 2015-12-11 NOTE — Addendum Note (Signed)
Addended by: Lurlean Nanny on: 12/11/2015 04:27 PM   Modules accepted: Orders

## 2015-12-11 NOTE — Progress Notes (Signed)
Pre visit review using our clinic review tool, if applicable. No additional management support is needed unless otherwise documented below in the visit note. 

## 2015-12-11 NOTE — Progress Notes (Signed)
HPI  Pt presents to the clinic today with c/o runny nose, nasal congestion and ear fullness. This started 4-5 days ago. She is blowing clear mucous out of her nose. She denies decreased hearing. She denies fever, chills or body aches. She has tried [employer] with Codeine, Claritin and Sudafed and does feel like her symptoms have improved, but she has now lost her voice. She does have a history of allergies and asthma. She takes Flonase and Zyrtec as needed. She has had sick contacts.  Review of Systems    Past Medical History  Diagnosis Date  . Allergic rhinitis   . Asthma   . Osteopenia   . Attention deficit disorder without mention of hyperactivity   . Diffuse cystic mastopathy   . Mixed hyperlipidemia   . Unspecified essential hypertension   . Hypoglycemia, unspecified   . Migraine, unspecified, without mention of intractable migraine without mention of status migrainosus   . Calculus of kidney   . Precancerous lesion   . Cataract   . High cholesterol     Family History  Problem Relation Age of Onset  . Diabetes Father   . Colon cancer Father   . Hypertension Father   . Heart attack Father   . Heart attack Mother   . Coronary artery disease Mother   . Pancreatic cancer Mother   . Hyperlipidemia Mother   . Other Brother     Congenital hyperspadias  . Other Brother     loss of kidney function in 1 kidney early in life  . Diabetes      Grandmother  . Coronary artery disease Maternal Grandmother   . Coronary artery disease      Maternal aunts and uncles  . Breast cancer Neg Hx   . Ovarian cancer Neg Hx   . Uterine cancer Neg Hx     Social History   Social History  . Marital Status: Married    Spouse Name: N/A  . Number of Children: 2  . Years of Education: N/A   Occupational History  . Retired    Social History Main Topics  . Smoking status: Never Smoker   . Smokeless tobacco: Never Used  . Alcohol Use: 0.6 oz/week    0 Standard drinks or equivalent, 1  Shots of liquor per week     Comment: occasional  . Drug Use: No  . Sexual Activity: Not on file   Other Topics Concern  . 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 Codeine     REACTION: n/v     Constitutional:Denies headache, fatigue, fever or  abrupt weight changes.  HEENT:  Positive runny nose, nasal congestion and sore throat. Denies eye redness, ear pain, ringing in the ears, wax buildup, or bloody nose. Respiratory: Positive cough. Denies difficulty breathing or shortness of breath.  Cardiovascular: Denies chest pain, chest tightness, palpitations or swelling in the hands or feet.   No other specific complaints in a complete review of systems (except as listed in HPI above).  Objective:   BP 130/80 mmHg  Pulse 74  Temp(Src) 97.5 F (36.4 C) (Oral)  Wt 149 lb (67.586 kg)  SpO2 98%  General: Appears her stated age, in NAD. HEENT: Head:  normal shape and size, no sinus tenderness noted; Eyes: sclera white, no icterus, conjunctiva pink; Ears: Tm's gray and intact, normal light reflex; Nose: mucosa boggy and moist, septum midline; Throat/Mouth: + PND. Teeth present, mucosa pink and moist, no exudate noted, no lesions or ulcerations noted.  Neck:  No cervical adenopathy noted.  Cardiovascular: Normal rate and rhythm. S1,S2 noted.  No murmur, rubs or gallops noted.  Pulmonary/Chest: Normal effort and positive vesicular breath sounds. No respiratory distress. No wheezes, rales or ronchi noted.      Assessment & Plan:   Allergic Rhinitis  Can use a Neti Pot which can be purchased from your local drug store. Flonase 2 sprays each nostril for 3 days and then as needed. Restart your Zyrtec 80 mg Depo IM today RX for Guaf-Codeine cough syrup  RTC as needed or if symptoms persist.

## 2015-12-11 NOTE — Patient Instructions (Signed)
Allergic Rhinitis Allergic rhinitis is when the mucous membranes in the nose respond to allergens. Allergens are particles in the air that cause your body to have an allergic reaction. This causes you to release allergic antibodies. Through a chain of events, these eventually cause you to release histamine into the blood stream. Although meant to protect the body, it is this release of histamine that causes your discomfort, such as frequent sneezing, congestion, and an itchy, runny nose.  CAUSES Seasonal allergic rhinitis (hay fever) is caused by pollen allergens that may come from grasses, trees, and weeds. Year-round allergic rhinitis (perennial allergic rhinitis) is caused by allergens such as house dust mites, pet dander, and mold spores. SYMPTOMS  Nasal stuffiness (congestion).  Itchy, runny nose with sneezing and tearing of the eyes. DIAGNOSIS Your health care provider can help you determine the allergen or allergens that trigger your symptoms. If you and your health care provider are unable to determine the allergen, skin or blood testing may be used. Your health care provider will diagnose your condition after taking your health history and performing a physical exam. Your health care provider may assess you for other related conditions, such as asthma, pink eye, or an ear infection. TREATMENT Allergic rhinitis does not have a cure, but it can be controlled by:  Medicines that block allergy symptoms. These may include allergy shots, nasal sprays, and oral antihistamines.  Avoiding the allergen. Hay fever may often be treated with antihistamines in pill or nasal spray forms. Antihistamines block the effects of histamine. There are over-the-counter medicines that may help with nasal congestion and swelling around the eyes. Check with your health care provider before taking or giving this medicine. If avoiding the allergen or the medicine prescribed do not work, there are many new medicines  your health care provider can prescribe. Stronger medicine may be used if initial measures are ineffective. Desensitizing injections can be used if medicine and avoidance does not work. Desensitization is when a patient is given ongoing shots until the body becomes less sensitive to the allergen. Make sure you follow up with your health care provider if problems continue. HOME CARE INSTRUCTIONS It is not possible to completely avoid allergens, but you can reduce your symptoms by taking steps to limit your exposure to them. It helps to know exactly what you are allergic to so that you can avoid your specific triggers. SEEK MEDICAL CARE IF:  You have a fever.  You develop a cough that does not stop easily (persistent).  You have shortness of breath.  You start wheezing.  Symptoms interfere with normal daily activities.   This information is not intended to replace advice given to you by your health care provider. Make sure you discuss any questions you have with your health care provider.   Document Released: 06/22/2001 Document Revised: 10/18/2014 Document Reviewed: 06/04/2013 Elsevier Interactive Patient Education 2016 Elsevier Inc.  

## 2015-12-31 ENCOUNTER — Other Ambulatory Visit: Payer: Self-pay | Admitting: *Deleted

## 2016-01-01 ENCOUNTER — Telehealth: Payer: Self-pay

## 2016-01-01 MED ORDER — GUAIFENESIN-CODEINE 200-20 MG/5ML PO LIQD: 5.0000 mL | Freq: Every evening | ORAL | Status: DC | PRN

## 2016-01-01 NOTE — Telephone Encounter (Signed)
RX printed and signed and placed in MYD box 

## 2016-01-01 NOTE — Telephone Encounter (Signed)
Gregary Signs at [facility] left v/m requesting clarification of size of bottle for Guiafenesin-codeine; the quantity has 1 bottle; med comes in 16 oz bottle.Please advise.

## 2016-01-01 NOTE — Telephone Encounter (Signed)
Verbal okay given to Almyra Free at [facility]

## 2016-01-01 NOTE — Telephone Encounter (Signed)
Spoke to [place] at [facility] and she states they can do 8 oz--please advise

## 2016-01-01 NOTE — Telephone Encounter (Signed)
Spoke to pt's spouse--he stated pt was at work and that he would come by to pick up Rx

## 2016-01-01 NOTE — Telephone Encounter (Signed)
Can they do 8 ounces?

## 2016-01-01 NOTE — Telephone Encounter (Signed)
8 oz

## 2016-03-04 ENCOUNTER — Other Ambulatory Visit: Payer: Self-pay | Admitting: *Deleted

## 2016-03-04 NOTE — Telephone Encounter (Signed)
Please schedule a 30 min office visit for f/u this summer and refill until then

## 2016-03-04 NOTE — Telephone Encounter (Signed)
Fax refill request, pt has had a few recent acute appts, but no CPE or f/u appts and no future appts please advise

## 2016-03-05 MED ORDER — AMLODIPINE BESYLATE 5 MG PO TABS: 5.0000 mg | ORAL_TABLET | Freq: Every day | ORAL | Status: DC

## 2016-03-05 NOTE — Telephone Encounter (Signed)
appt scheduled and med refill 

## 2016-03-16 ENCOUNTER — Other Ambulatory Visit: Payer: Self-pay | Admitting: *Deleted

## 2016-03-16 MED ORDER — FLUTICASONE PROPIONATE 50 MCG/ACT NA SUSP: 2.0000 | Freq: Every day | NASAL | Status: DC

## 2016-03-18 ENCOUNTER — Telehealth: Payer: Self-pay | Admitting: *Deleted

## 2016-03-18 MED ORDER — ROSUVASTATIN CALCIUM 20 MG PO TABS: ORAL_TABLET | ORAL | Status: DC

## 2016-03-18 NOTE — Telephone Encounter (Signed)
Fax refill request.

## 2016-04-23 ENCOUNTER — Encounter: Payer: Self-pay | Admitting: Family Medicine

## 2016-04-23 ENCOUNTER — Ambulatory Visit (INDEPENDENT_AMBULATORY_CARE_PROVIDER_SITE_OTHER): Payer: BC Managed Care – PPO | Admitting: Family Medicine

## 2016-04-23 VITALS — BP 122/82 | HR 73 | Temp 98.1°F | Ht 66.0 in | Wt 150.0 lb

## 2016-04-23 DIAGNOSIS — E782 Mixed hyperlipidemia: Secondary | ICD-10-CM

## 2016-04-23 DIAGNOSIS — I10 Essential (primary) hypertension: Secondary | ICD-10-CM | POA: Diagnosis not present

## 2016-04-23 DIAGNOSIS — J302 Other seasonal allergic rhinitis: Secondary | ICD-10-CM | POA: Diagnosis not present

## 2016-04-23 DIAGNOSIS — S9032XA Contusion of left foot, initial encounter: Secondary | ICD-10-CM

## 2016-04-23 DIAGNOSIS — H6982 Other specified disorders of Eustachian tube, left ear: Secondary | ICD-10-CM

## 2016-04-23 DIAGNOSIS — H6980 Other specified disorders of Eustachian tube, unspecified ear: Secondary | ICD-10-CM | POA: Insufficient documentation

## 2016-04-23 DIAGNOSIS — S9030XA Contusion of unspecified foot, initial encounter: Secondary | ICD-10-CM | POA: Insufficient documentation

## 2016-04-23 LAB — CBC WITH DIFFERENTIAL/PLATELET
BASOS ABS: 0 10*3/uL (ref 0.0–0.1)
BASOS PCT: 0.7 % (ref 0.0–3.0)
Eosinophils Absolute: 0.2 10*3/uL (ref 0.0–0.7)
Eosinophils Relative: 3.5 % (ref 0.0–5.0)
HCT: 45.8 % (ref 36.0–46.0)
Hemoglobin: 15.3 g/dL — ABNORMAL HIGH (ref 12.0–15.0)
Lymphocytes Relative: 27.7 % (ref 12.0–46.0)
Lymphs Abs: 1.8 10*3/uL (ref 0.7–4.0)
MCHC: 33.3 g/dL (ref 30.0–36.0)
MCV: 84.4 fl (ref 78.0–100.0)
MONO ABS: 0.4 10*3/uL (ref 0.1–1.0)
Monocytes Relative: 6.4 % (ref 3.0–12.0)
NEUTROS ABS: 4.1 10*3/uL (ref 1.4–7.7)
Neutrophils Relative %: 61.7 % (ref 43.0–77.0)
PLATELETS: 190 10*3/uL (ref 150.0–400.0)
RBC: 5.43 Mil/uL — ABNORMAL HIGH (ref 3.87–5.11)
RDW: 13.8 % (ref 11.5–15.5)
WBC: 6.6 10*3/uL (ref 4.0–10.5)

## 2016-04-23 LAB — LIPID PANEL
Cholesterol: 246 mg/dL — ABNORMAL HIGH (ref 0–200)
HDL: 59.9 mg/dL (ref >39.00)
NONHDL: 185.64
TRIGLYCERIDES: 232 mg/dL — AB (ref 0.0–149.0)
Total CHOL/HDL Ratio: 4
VLDL: 46.4 mg/dL — ABNORMAL HIGH (ref 0.0–40.0)

## 2016-04-23 LAB — COMPREHENSIVE METABOLIC PANEL
ALK PHOS: 54 U/L (ref 39–117)
ALT: 21 U/L (ref 0–35)
AST: 22 U/L (ref 0–37)
Albumin: 4.6 g/dL (ref 3.5–5.2)
BUN: 14 mg/dL (ref 6–23)
CO2: 28 mEq/L (ref 19–32)
Calcium: 9.7 mg/dL (ref 8.4–10.5)
Chloride: 104 mEq/L (ref 96–112)
Creatinine, Ser: 0.81 mg/dL (ref 0.40–1.20)
GFR: 75.46 mL/min (ref >60.00)
GLUCOSE: 100 mg/dL — AB (ref 70–99)
POTASSIUM: 4.6 meq/L (ref 3.5–5.1)
Sodium: 140 mEq/L (ref 135–145)
TOTAL PROTEIN: 7.2 g/dL (ref 6.0–8.3)
Total Bilirubin: 0.4 mg/dL (ref 0.2–1.2)

## 2016-04-23 LAB — TSH: TSH: 1.86 u[IU]/mL (ref 0.35–4.50)

## 2016-04-23 LAB — LDL CHOLESTEROL, DIRECT: Direct LDL: 145 mg/dL

## 2016-04-23 MED ORDER — ESTRADIOL 0.1 MG/24HR TD PTTW: 1.0000 | MEDICATED_PATCH | TRANSDERMAL | Status: DC

## 2016-04-23 MED ORDER — AMLODIPINE BESYLATE 5 MG PO TABS: 5.0000 mg | ORAL_TABLET | Freq: Every day | ORAL | Status: AC

## 2016-04-23 MED ORDER — VENLAFAXINE HCL ER 75 MG PO CP24: ORAL_CAPSULE | ORAL | Status: AC

## 2016-04-23 MED ORDER — ROSUVASTATIN CALCIUM 20 MG PO TABS: ORAL_TABLET | ORAL | Status: AC

## 2016-04-23 MED ORDER — FLUTICASONE PROPIONATE 50 MCG/ACT NA SUSP: 2.0000 | Freq: Every day | NASAL | Status: AC

## 2016-04-23 NOTE — Patient Instructions (Addendum)
For allergies and ear complaints- use flonase twice daily for 1-2 weeks and then go back to once daily  Watch the toenails - it takes 6 months to grow out from trauma  Let nails grow and cut straight across Call your gyn office to see about getting a blood density test I refilled your medicines  Lab today

## 2016-04-23 NOTE — Progress Notes (Signed)
Pre visit review using our clinic review tool, if applicable. No additional management support is needed unless otherwise documented below in the visit note. 

## 2016-04-23 NOTE — Progress Notes (Signed)
Subjective:    Patient ID: Sharon Hester, female    DOB: 1950-11-22, 65 y.o.   MRN: [id number]  HPI Here for f/u of chronic medical problems and a few new ones   Doing well overall   Banged her toe on a ladder (3rd toe on L foot)  Nail is thickened   Has a hx of "fluid in her ears" Has some tinnitus in L ear- ? Can hear heartbeat  Uses flonase every day  Wt Readings from Last 3 Encounters:  04/23/16 150 lb (68.04 kg)  12/11/15 149 lb (67.586 kg)  10/27/15 151 lb (68.493 kg)  bmi is 24    It has been over a year since labs -she is due   bp is stable today  No cp or palpitations or headaches or edema  No side effects to medicines  BP Readings from Last 3 Encounters:  04/23/16 122/82  12/11/15 130/80  10/27/15 138/86    On amlodipine     Osteopenia Is on an estrogen patch She wants to continue it -taking for severe night sweats  Understands the risks of breast cancer  ? Last dexa (had one as a courtesy one time with a mammogram years ago) Would like to set it up - last one was with gyn/she will call that office     Hyperlipidemia Lab Results  Component Value Date   CHOL 224* 03/11/2015   HDL 61.90 03/11/2015   LDLDIRECT 116.0 03/11/2015   TRIG 207.0* 03/11/2015   CHOLHDL 4 03/11/2015   on crestor and diet Still taking it and eating a healthy diet   Patient Active Problem List   Diagnosis Date Noted  . Contusion, foot 04/23/2016  . ETD (eustachian tube dysfunction) 04/23/2016  . OSTEOPENIA 10/02/2007  . HYPOGLYCEMIA 09/14/2007  . MIGRAINE HEADACHE 09/14/2007  . Allergic rhinitis 09/14/2007  . ASTHMA 09/14/2007  . RENAL CALCULUS 09/14/2007  . FIBROCYSTIC BREAST DISEASE 09/14/2007  . HYPERLIPIDEMIA 08/22/2007  . ADD 08/22/2007  . Essential hypertension 08/22/2007   Past Medical History  Diagnosis Date  . Allergic rhinitis   . Asthma   . Osteopenia   . Attention deficit disorder without mention of hyperactivity   . Diffuse cystic mastopathy   .  Mixed hyperlipidemia   . Unspecified essential hypertension   . Hypoglycemia, unspecified   . Migraine, unspecified, without mention of intractable migraine without mention of status migrainosus   . Calculus of kidney   . Precancerous lesion   . Cataract   . High cholesterol    Past Surgical History  Procedure Laterality Date  . Eye surgery      multiple; s/p childhood trauma  . Appendectomy    . Lithotripsy    . Tonsillectomy    . Laparoscopic hysterectomy  1/04  . 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 Colonoscopy  5/06  . Colonoscopy with propofol N/A 05/12/2015    Procedure: COLONOSCOPY WITH PROPOFOL;  Surgeon: Manya Silvas, MD;  Location: [facility] ENDOSCOPY;  Service: Endoscopy;  Laterality: N/A;   Social History  Substance Use Topics  . Smoking status: Never Smoker   . Smokeless tobacco: Never Used  . Alcohol Use: 0.6 oz/week    1 Shots of liquor, 0 Standard drinks or equivalent per week     Comment: occasional   Family History  Problem Relation Age of Onset  . Diabetes Father   . Colon cancer Father   . Hypertension Father   . Heart attack  Father   . Heart attack Mother   . Coronary artery disease Mother   . Pancreatic cancer Mother   . Hyperlipidemia Mother   . Other Brother     Congenital hyperspadias  . Other Brother     loss of kidney function in 1 kidney early in life  . Diabetes      Grandmother  . Coronary artery disease Maternal Grandmother   . Coronary artery disease      Maternal aunts and uncles  . Breast cancer Neg Hx   . Ovarian cancer Neg Hx   . Uterine cancer Neg Hx    Allergies  Allergen Reactions  . Codeine     REACTION: n/v   Current Outpatient Prescriptions on File Prior to Visit  Medication Sig Dispense Refill  . cetirizine (ZYRTEC) 10 MG tablet Take 10 mg by mouth daily.      . cholecalciferol (VITAMIN D) 1000 UNITS tablet Take 1,000 Units by mouth daily.      . Coenzyme Q10 (COQ10) 400 MG CAPS Take 1 capsule by mouth  daily.      . Flaxseed, Linseed, (FLAX SEED OIL) 1000 MG CAPS Take 1 capsule by mouth daily.    . Guaifenesin-Codeine 200-20 MG/5ML LIQD Take 5 mLs by mouth at bedtime as needed. 1 Bottle 0  . Multiple Vitamin (MULTIVITAMIN) tablet Take 1 tablet by mouth daily.      . Omega-3 Fatty Acids (FISH OIL) 1200 MG CAPS Take 1 capsule by mouth 2 (two) times daily.     . Probiotic Product (PROBIOTIC DAILY PO) Take 1 capsule by mouth daily.     No current facility-administered medications on file prior to visit.    Review of Systems Review of Systems  Constitutional: Negative for fever, appetite change, fatigue and unexpected weight change.  ENT pos for pnd/rhinorrhea and ear fullness/ neg for ear drainage  Eyes: Negative for pain and visual disturbance.  Respiratory: Negative for cough and shortness of breath.   Cardiovascular: Negative for cp or palpitations    Gastrointestinal: Negative for nausea, diarrhea and constipation.  Genitourinary: Negative for urgency and frequency.  Skin: Negative for pallor or rash  pos for injured toenail on L foot  Neurological: Negative for weakness, light-headedness, numbness and headaches.  Hematological: Negative for adenopathy. Does not bruise/bleed easily.  Psychiatric/Behavioral: Negative for dysphoric mood. The patient is not nervous/anxious.         Objective:   Physical Exam  Constitutional: She appears well-developed and well-nourished. No distress.  Well appearing   HENT:  Head: Normocephalic and atraumatic.  Mouth/Throat: Oropharynx is clear and moist.  Nares are boggy  Some clear pnd and rhinorrhea  TM slt dull on L and retracted on R  Eyes: Conjunctivae and EOM are normal. Pupils are equal, round, and reactive to light.  Neck: Normal range of motion. Neck supple. No JVD present. Carotid bruit is not present. No thyromegaly present.  Cardiovascular: Normal rate, regular rhythm, normal heart sounds and intact distal pulses.  Exam reveals no  gallop.   Pulmonary/Chest: Effort normal and breath sounds normal. No respiratory distress. She has no wheezes. She has no rales.  No crackles  Abdominal: Soft. Bowel sounds are normal. She exhibits no distension, no abdominal bruit and no mass. There is no tenderness.  Musculoskeletal: She exhibits no edema.  Lymphadenopathy:    She has no cervical adenopathy.  Neurological: She is alert. She has normal reflexes.  Skin: Skin is warm and dry. No rash  noted.  L 3rd toenail is thickened at the base s/p trauma   Psychiatric: She has a normal mood and affect.          Assessment & Plan:   Problem List Items Addressed This Visit      Cardiovascular and Mediastinum   Essential hypertension - Primary    bp in fair control at this time  BP Readings from Last 1 Encounters:  04/23/16 122/82   No changes needed Disc lifstyle change with low sodium diet and exercise        Relevant Medications   rosuvastatin (CRESTOR) 20 MG tablet   amLODipine (NORVASC) 5 MG tablet   Other Relevant Orders   CBC with Differential/Platelet (Completed)   Comprehensive metabolic panel (Completed)   TSH (Completed)   Lipid panel (Completed)     Respiratory   Allergic rhinitis    With more ear congestion//ETD-this could add to tinnitus inst pt to use flonase 2 times daily for 1-2 weeks and then go back to once daily  Watch for s/s of sinus infection  Update if no imp or worse Consider ENT ref         Nervous and Auditory   ETD (eustachian tube dysfunction)    Plan to inc flonase to bid for the next 1-2 wk and update Avoid allergens when able ENT ref if no imp         Other   HYPERLIPIDEMIA    Lipid panel today on statin and diet  Disc goals for lipids and reasons to control them Rev labs with pt (from last check)  Rev low sat fat diet in detail       Relevant Medications   rosuvastatin (CRESTOR) 20 MG tablet   amLODipine (NORVASC) 5 MG tablet   Other Relevant Orders   Lipid panel  (Completed)   Contusion, foot    R third toenail was injured - looks like it is growing in thickened May be temporary or not  Disc well fitting shoes and safety  Continue to follow

## 2016-04-25 NOTE — Assessment & Plan Note (Signed)
Lipid panel today on statin and diet  Disc goals for lipids and reasons to control them Rev labs with pt (from last check)  Rev low sat fat diet in detail

## 2016-04-25 NOTE — Assessment & Plan Note (Signed)
With more ear congestion//ETD-this could add to tinnitus inst pt to use flonase 2 times daily for 1-2 weeks and then go back to once daily  Watch for s/s of sinus infection  Update if no imp or worse Consider ENT ref

## 2016-04-25 NOTE — Assessment & Plan Note (Signed)
bp in fair control at this time  BP Readings from Last 1 Encounters:  04/23/16 122/82   No changes needed Disc lifstyle change with low sodium diet and exercise

## 2016-04-25 NOTE — Assessment & Plan Note (Signed)
Plan to inc flonase to bid for the next 1-2 wk and update Avoid allergens when able ENT ref if no imp

## 2016-04-25 NOTE — Assessment & Plan Note (Signed)
R third toenail was injured - looks like it is growing in thickened May be temporary or not  Disc well fitting shoes and safety  Continue to follow

## 2016-04-28 ENCOUNTER — Telehealth: Payer: Self-pay | Admitting: *Deleted

## 2016-04-28 NOTE — Telephone Encounter (Signed)
Received fax saying PA was approved. Pharmacy aware and letter placed in your inbox to sign and send for scanning

## 2016-04-28 NOTE — Telephone Encounter (Signed)
PA for pt's Crestor 20mg  done on [email or web address], I will await response

## 2016-07-14 ENCOUNTER — Telehealth: Payer: Self-pay | Admitting: *Deleted

## 2016-07-14 NOTE — Telephone Encounter (Signed)
PA for crestor was approved, pharmacy notified and letter placed in Dr. Marliss Coots inbox to sign and send for scanning

## 2016-07-14 NOTE — Telephone Encounter (Signed)
PA done for crestor (1.5 tabs qd), I will await a response

## 2016-07-29 ENCOUNTER — Other Ambulatory Visit: Payer: Self-pay | Admitting: Family Medicine

## 2016-09-09 ENCOUNTER — Telehealth: Payer: Self-pay | Admitting: *Deleted

## 2016-09-09 NOTE — Telephone Encounter (Signed)
PA done for estradiol patches at [email or web address], I will await a response

## 2016-09-10 NOTE — Telephone Encounter (Signed)
Received form requesting additional info on PA, please complete the clinical info part and return to me

## 2016-09-10 NOTE — Telephone Encounter (Signed)
Done and in IN box 

## 2016-09-11 ENCOUNTER — Encounter: Payer: Self-pay | Admitting: Gynecology

## 2016-09-11 ENCOUNTER — Ambulatory Visit
Admission: EM | Admit: 2016-09-11 | Discharge: 2016-09-11 | Disposition: A | Discharge status: Home / Self Care | Payer: Medicare Other | Attending: Family Medicine | Admitting: Family Medicine

## 2016-09-11 DIAGNOSIS — N3001 Acute cystitis with hematuria: Secondary | ICD-10-CM | POA: Diagnosis not present

## 2016-09-11 DIAGNOSIS — M940 Chondrocostal junction syndrome [Tietze]: Secondary | ICD-10-CM

## 2016-09-11 LAB — URINALYSIS COMPLETE WITH MICROSCOPIC (ARMC ONLY)
Bilirubin Urine: NEGATIVE
Glucose, UA: NEGATIVE mg/dL
Ketones, ur: NEGATIVE mg/dL
LEUKOCYTES UA: NEGATIVE
Nitrite: NEGATIVE
PH: 7 (ref 5.0–8.0)
PROTEIN: 100 mg/dL — AB
Specific Gravity, Urine: 1.02 (ref 1.005–1.030)

## 2016-09-11 MED ORDER — NITROFURANTOIN MONOHYD MACRO 100 MG PO CAPS
100.0000 mg | ORAL_CAPSULE | Freq: Two times a day (BID) | ORAL | 0 refills | Status: AC
Start: 2016-09-11 — End: ?

## 2016-09-11 MED ORDER — HYDROCODONE-ACETAMINOPHEN 5-325 MG PO TABS: 1.0000 | ORAL_TABLET | Freq: Four times a day (QID) | ORAL | 0 refills | Status: DC | PRN

## 2016-09-11 NOTE — ED Triage Notes (Signed)
Per patient c/o RUQ x 3 week on and off.

## 2016-09-11 NOTE — ED Provider Notes (Signed)
[facility]    CSN: [id number] Arrival date & time: 09/11/16  1239     History   Chief Complaint Chief Complaint  Patient presents with  . Abdominal Pain    HPI Sharon Hester is a 65 y.o. female.   65 yo female with a c/o right upper quadrant/right lower rib pain for 3 weeks. Denies any injuries, fevers, chills, cough,  dysuria, nausea, vomiting, diarrhea, constipation. States pain is worse with occasional deep breaths or position changes.    The history is provided by the patient.  Abdominal Pain    Past Medical History:  Diagnosis Date  . Allergic rhinitis   . Asthma   . Attention deficit disorder without mention of hyperactivity   . Calculus of kidney   . Cataract   . Diffuse cystic mastopathy   . High cholesterol   . Hypoglycemia, unspecified   . Migraine, unspecified, without mention of intractable migraine without mention of status migrainosus   . Mixed hyperlipidemia   . Osteopenia   . Precancerous lesion   . Unspecified essential hypertension     Patient Active Problem List   Diagnosis Date Noted  . Contusion, foot 04/23/2016  . ETD (eustachian tube dysfunction) 04/23/2016  . OSTEOPENIA 10/02/2007  . HYPOGLYCEMIA 09/14/2007  . MIGRAINE HEADACHE 09/14/2007  . Allergic rhinitis 09/14/2007  . ASTHMA 09/14/2007  . RENAL CALCULUS 09/14/2007  . FIBROCYSTIC BREAST DISEASE 09/14/2007  . HYPERLIPIDEMIA 08/22/2007  . ADD 08/22/2007  . Essential hypertension 08/22/2007    Past Surgical History:  Procedure Laterality Date  . APPENDECTOMY    . COLONOSCOPY  5/06  . COLONOSCOPY WITH PROPOFOL N/A 05/12/2015   Procedure: COLONOSCOPY WITH PROPOFOL;  Surgeon: Manya Silvas, MD;  Location: [facility] ENDOSCOPY;  Service: Endoscopy;  Laterality: N/A;  . EYE SURGERY     multiple; s/p childhood trauma  . LAPAROSCOPIC HYSTERECTOMY  1/04  . LITHOTRIPSY    . 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 TONSILLECTOMY      OB History    No data available        Home Medications    Prior to Admission medications   Medication Sig Start Date End Date Taking? Authorizing Provider  amLODipine (NORVASC) 5 MG tablet Take 1 tablet (5 mg total) by mouth daily. 04/23/16  Yes Abner Greenspan, MD  cetirizine (ZYRTEC) 10 MG tablet Take 10 mg by mouth daily.     Yes Historical Provider, MD  cholecalciferol (VITAMIN D) 1000 UNITS tablet Take 1,000 Units by mouth daily.     Yes Historical Provider, MD  Coenzyme Q10 (COQ10) 400 MG CAPS Take 1 capsule by mouth daily.     Yes Historical Provider, MD  estradiol (VIVELLE-DOT) 0.1 MG/24HR patch Place 1 patch (0.1 mg total) onto the skin 2 (two) times a week. 04/23/16  Yes [place], MD  Flaxseed, Linseed, (FLAX SEED OIL) 1000 MG CAPS Take 1 capsule by mouth daily.   Yes Historical Provider, MD  fluticasone (FLONASE) 50 MCG/ACT nasal spray Place 2 sprays into both nostrils daily. 04/23/16 04/23/17 Yes Abner Greenspan, MD  Multiple Vitamin (MULTIVITAMIN) tablet Take 1 tablet by mouth daily.     Yes Historical Provider, MD  Omega-3 Fatty Acids (FISH OIL) 1200 MG CAPS Take 1 capsule by mouth 2 (two) times daily.    Yes Historical Provider, MD  Probiotic Product (PROBIOTIC DAILY PO) Take 1 capsule by mouth daily.   Yes Historical Provider, MD  rosuvastatin (CRESTOR)  20 MG tablet Take one and one half tablets by mouth once daily for cholesterol 04/23/16  Yes [place], MD  travoprost, benzalkonium, (TRAVATAN) 0.004 % ophthalmic solution Place 1 drop into the right eye at bedtime.   Yes Historical Provider, MD  venlafaxine XR (EFFEXOR-XR) 75 MG 24 hr capsule take 1 capsule by mouth once daily 04/23/16  Yes Abner Greenspan, MD  Guaifenesin-Codeine 200-20 MG/5ML LIQD Take 5 mLs by mouth at bedtime as needed. 01/01/16   Jearld Fenton, NP  HYDROcodone-acetaminophen (NORCO/VICODIN) 5-325 MG tablet Take 1-2 tablets by mouth every 6 (six) hours as needed. 09/11/16   Norval Gable, MD  nitrofurantoin, macrocrystal-monohydrate,  (MACROBID) 100 MG capsule Take 1 capsule (100 mg total) by mouth 2 (two) times daily. 09/11/16   Norval Gable, MD    Family History Family History  Problem Relation Age of Onset  . Diabetes Father   . Colon cancer Father   . Hypertension Father   . Heart attack Father   . Heart attack Mother   . Coronary artery disease Mother   . Pancreatic cancer Mother   . Hyperlipidemia Mother   . Other Brother     Congenital hyperspadias  . Other Brother     loss of kidney function in 1 kidney early in life  . Diabetes      Grandmother  . Coronary artery disease Maternal Grandmother   . Coronary artery disease      Maternal aunts and uncles  . Breast cancer Neg Hx   . Ovarian cancer Neg Hx   . Uterine cancer Neg Hx     Social History Social History  Substance Use Topics  . Smoking status: Never Smoker  . Smokeless tobacco: Never Used  . Alcohol use 0.6 oz/week    1 Shots of liquor per week     Comment: occasional     Allergies   Codeine   Review of Systems Review of Systems  Gastrointestinal: Positive for abdominal pain.     Physical Exam Triage Vital Signs ED Triage Vitals  Enc Vitals Group     BP 09/11/16 1306 (!) 145/73     Pulse Rate 09/11/16 1306 70     Resp 09/11/16 1306 16     Temp 09/11/16 1306 97.7 F (36.5 C)     Temp Source 09/11/16 1306 Oral     SpO2 --      Weight 09/11/16 1309 147 lb (66.7 kg)     Height --      Head Circumference --      Peak Flow --      Pain Score 09/11/16 1311 2     Pain Loc --      Pain Edu? --      Excl. in [place]? --    No data found.   Updated Vital Signs BP (!) 145/73 (BP Location: Left Arm)   Pulse 70   Temp 97.7 F (36.5 C) (Oral)   Resp 16   Wt 147 lb (66.7 kg)   BMI 23.73 kg/m   Visual Acuity Right Eye Distance:   Left Eye Distance:   Bilateral Distance:    Right Eye Near:   Left Eye Near:    Bilateral Near:     Physical Exam  Constitutional: She appears well-developed and well-nourished. No  distress.  Pulmonary/Chest: Effort normal and breath sounds normal. No respiratory distress. She has no wheezes. She has no rales. She exhibits tenderness (right lower rib chest wall  reproducible).  Abdominal: Soft. Bowel sounds are normal. She exhibits no distension and no mass. There is no tenderness. There is no rebound and no guarding.  Skin: She is not diaphoretic.  Nursing note and vitals reviewed.    UC Treatments / Results  Labs (all labs ordered are listed, but only abnormal results are displayed) Labs Reviewed  URINALYSIS COMPLETEWITH MICROSCOPIC ([place]) - Abnormal; Notable for the following:       Result Value   APPearance HAZY (*)    Hgb urine dipstick TRACE (*)    Protein, ur 100 (*)    Bacteria, UA MANY (*)    Squamous Epithelial / LPF 0-5 (*)    All other components within normal limits  URINE CULTURE    EKG  EKG Interpretation None       Radiology No results found.  Procedures Procedures (including critical care time)  Medications Ordered in UC Medications - No data to display   Initial Impression / Assessment and Plan / UC Course  I have reviewed the triage vital signs and the nursing notes.  Pertinent labs & imaging results that were available during my care of the patient were reviewed by me and considered in my medical decision making (see chart for details).  Clinical Course       Final Clinical Impressions(s) / UC Diagnoses   Final diagnoses:  Costochondritis, acute  Acute cystitis with hematuria    New Prescriptions Discharge Medication List as of 09/11/2016  1:54 PM    START taking these medications   Details  HYDROcodone-acetaminophen (NORCO/VICODIN) 5-325 MG tablet Take 1-2 tablets by mouth every 6 (six) hours as needed., Starting Sat 09/11/2016, Print    nitrofurantoin, macrocrystal-monohydrate, (MACROBID) 100 MG capsule Take 1 capsule (100 mg total) by mouth 2 (two) times daily., Starting Sat 09/11/2016, Normal       1.  Lab results and diagnosis reviewed with patient 2. rx as per orders above; reviewed possible side effects, interactions, risks and benefits  3. Recommend supportive treatment with increased fluids 4. Follow-up prn if symptoms worsen or don't improve   Norval Gable, MD 09/11/16 [phone]

## 2016-09-13 ENCOUNTER — Ambulatory Visit: Payer: BC Managed Care – PPO | Admitting: Family Medicine

## 2016-09-13 ENCOUNTER — Telehealth: Payer: Self-pay

## 2016-09-13 LAB — URINE CULTURE: Culture: NO GROWTH

## 2016-09-13 NOTE — Telephone Encounter (Signed)
Form faxed

## 2016-09-14 ENCOUNTER — Telehealth: Payer: Self-pay | Admitting: Urology

## 2016-09-14 ENCOUNTER — Encounter: Payer: Self-pay | Admitting: Urology

## 2016-09-14 ENCOUNTER — Ambulatory Visit
Admission: RE | Admit: 2016-09-14 | Discharge: 2016-09-14 | Disposition: A | Discharge status: Home / Self Care | Payer: Medicare Other | Source: Ambulatory Visit | Attending: Urology | Admitting: Urology

## 2016-09-14 ENCOUNTER — Ambulatory Visit: Payer: Medicare Other | Admitting: Urology

## 2016-09-14 VITALS — BP 116/78 | HR 77 | Ht 66.0 in | Wt 149.4 lb

## 2016-09-14 DIAGNOSIS — R109 Unspecified abdominal pain: Secondary | ICD-10-CM

## 2016-09-14 DIAGNOSIS — N200 Calculus of kidney: Secondary | ICD-10-CM

## 2016-09-14 DIAGNOSIS — R3129 Other microscopic hematuria: Secondary | ICD-10-CM

## 2016-09-14 NOTE — Addendum Note (Signed)
Addended by: Kyra Manges on: 09/14/2016 04:37 PM   Modules accepted: Orders

## 2016-09-14 NOTE — Telephone Encounter (Signed)
Patient's X-ray demonstrated a left kidney stone.  Her UA demonstrated blood in her urine.  We will need to have her undergo a CT Urogram.

## 2016-09-14 NOTE — Progress Notes (Signed)
09/14/2016 4:11 PM   Sharon Hester [birth year] [id number]  Referring provider: Abner Greenspan, MD [address] [place], [place] [address]  Chief Complaint  Patient presents with  . New Patient (Initial Visit)    Right flank pain    HPI: Patient is a 65 year old Caucasian female who presents today as a referral from [facility] urgent care for possible kidney stones.    She states that she starting having pain on Sunday in the left flank, but then yesterday the pain got so intense it doubled her over in pain.  The pain is cramping in nature.  Movement made the pain worse.  Being still made the pain better.  She has not seen blood in her urine.  She has had associated frequency, urgency, dysuria, nocturia, intermittency, hesitancy and straining to urinate.  These symptoms started with the pain.  She has had several stones in the past.  She was treated at [facility].  Her stone composition is calcium oxalate.  She has been stone free for several years.   She has not had fevers, chills, nausea or vomiting.    UA in the urgent care was positive for 0-5 RBC's, many bacteria and calcium oxalate crystals.  Urine culture was negative.  She was given Macrobid at the urgent care.  She went to the urgent care for right flank pain that had been bothering her for the last three weeks.    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    Today's KUB noted left renal calculi. No definite right renal or ureteral calculi are seen.   I have independently reviewed the films.    PMH: Past Medical History:  Diagnosis Date  . Allergic rhinitis   . Asthma   . Attention deficit disorder without mention of hyperactivity   . Calculus of kidney   . Cataract   . Diffuse cystic mastopathy   . High cholesterol   . Hypoglycemia, unspecified   . Migraine, unspecified, without mention of intractable migraine without mention of status migrainosus   . Mixed hyperlipidemia   . Osteopenia   .  Precancerous lesion   . Unspecified essential hypertension     Surgical History: Past Surgical History:  Procedure Laterality Date  . APPENDECTOMY    . COLONOSCOPY  5/06  . COLONOSCOPY WITH PROPOFOL N/A 05/12/2015   Procedure: COLONOSCOPY WITH PROPOFOL;  Surgeon: Manya Silvas, MD;  Location: [facility] ENDOSCOPY;  Service: Endoscopy;  Laterality: N/A;  . EYE SURGERY     multiple; s/p childhood trauma  . LAPAROSCOPIC HYSTERECTOMY  1/04  . LITHOTRIPSY    . 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 TONSILLECTOMY      Home Medications:    Medication List       Accurate as of 09/14/16  4:11 PM. Always use your most recent med list.          amLODipine 5 MG tablet Commonly known as:  NORVASC Take 1 tablet (5 mg total) by mouth daily.   cetirizine 10 MG tablet Commonly known as:  ZYRTEC Take 10 mg by mouth daily.   cholecalciferol 1000 units tablet Commonly known as:  VITAMIN D Take 1,000 Units by mouth daily.   CoQ10 400 MG Caps Take 1 capsule by mouth daily.   estradiol 0.1 MG/24HR patch Commonly known as:  VIVELLE-DOT Place 1 patch (0.1 mg total) onto the skin 2 (two) times a week.  Fish Oil 1200 MG Caps Take 1 capsule by mouth 2 (two) times daily.   Flax Seed Oil 1000 MG Caps Take 1 capsule by mouth daily.   fluticasone 50 MCG/ACT nasal spray Commonly known as:  FLONASE Place 2 sprays into both nostrils daily.   Guaifenesin-Codeine 200-20 MG/5ML Liqd Take 5 mLs by mouth at bedtime as needed.   HYDROcodone-acetaminophen 5-325 MG tablet Commonly known as:  NORCO/VICODIN Take 1-2 tablets by mouth every 6 (six) hours as needed.   multivitamin tablet Take 1 tablet by mouth daily.   nitrofurantoin (macrocrystal-monohydrate) 100 MG capsule Commonly known as:  MACROBID Take 1 capsule (100 mg total) by mouth 2 (two) times daily.   PROBIOTIC DAILY PO Take 1 capsule by mouth daily.   rosuvastatin 20 MG tablet Commonly known as:  CRESTOR Take one and  one half tablets by mouth once daily for cholesterol   travoprost (benzalkonium) 0.004 % ophthalmic solution Commonly known as:  TRAVATAN Place 1 drop into the right eye at bedtime.   venlafaxine XR 75 MG 24 hr capsule Commonly known as:  EFFEXOR-XR take 1 capsule by mouth once daily       Allergies:  Allergies  Allergen Reactions  . Codeine     REACTION: n/v    Family History: Family History  Problem Relation Age of Onset  . Diabetes Father   . Colon cancer Father   . Hypertension Father   . Heart attack Father   . Heart attack Mother   . Coronary artery disease Mother   . Pancreatic cancer Mother   . Hyperlipidemia Mother   . Other Brother     Congenital hyperspadias  . Other Brother     loss of kidney function in 1 kidney early in life  . Diabetes      Grandmother  . Coronary artery disease Maternal Grandmother   . Coronary artery disease      Maternal aunts and uncles  . Breast cancer Neg Hx   . Ovarian cancer Neg Hx   . Uterine cancer Neg Hx     Social History:  reports that she has never smoked. She has never used smokeless tobacco. She reports that she drinks about 0.6 oz of alcohol per week . She reports that she does not use drugs.  ROS: UROLOGY Frequent Urination?: Yes Hard to postpone urination?: Yes Burning/pain with urination?: Yes Get up at night to urinate?: Yes Leakage of urine?: No Urine stream starts and stops?: Yes Trouble starting stream?: Yes Do you have to strain to urinate?: Yes Blood in urine?: Yes Urinary tract infection?: Yes Sexually transmitted disease?: No Injury to kidneys or bladder?: No Painful intercourse?: No Weak stream?: No Currently pregnant?: No Vaginal bleeding?: No Last menstrual period?: n  Gastrointestinal Nausea?: No Vomiting?: No Indigestion/heartburn?: No Diarrhea?: No Constipation?: No  Constitutional Fever: No Night sweats?: No Weight loss?: No Fatigue?: No  Skin Skin rash/lesions?:  No Itching?: No  Eyes Blurred vision?: No Double vision?: No  Ears/Nose/Throat Sore throat?: No Sinus problems?: No  Hematologic/Lymphatic Swollen glands?: No Easy bruising?: No  Cardiovascular Leg swelling?: No Chest pain?: No  Respiratory Cough?: No Shortness of breath?: No  Endocrine Excessive thirst?: No  Musculoskeletal Back pain?: Yes Joint pain?: No  Neurological Headaches?: No Dizziness?: No  Psychologic Depression?: No Anxiety?: No  Physical Exam: BP 116/78 (BP Location: Left Arm, Patient Position: Sitting, Cuff Size: Normal)   Pulse 77   Ht 5\' 6"  (1.676 m)   Wt 149  lb 6.4 oz (67.8 kg)   BMI 24.11 kg/m   Constitutional: Well nourished. Alert and oriented, No acute distress. HEENT: [place] AT, moist mucus membranes. Trachea midline, no masses. Cardiovascular: No clubbing, cyanosis, or edema. Respiratory: Normal respiratory effort, no increased work of breathing. GI: Abdomen is soft, non tender, non distended, no abdominal masses. Liver and spleen not palpable.  No hernias appreciated.  Stool sample for occult testing is not indicated.   GU: No CVA tenderness.  No bladder fullness or masses.   Skin: No rashes, bruises or suspicious lesions. Lymph: No cervical or inguinal adenopathy. Neurologic: Grossly intact, no focal deficits, moving all 4 extremities. Psychiatric: Normal mood and affect. Tenderness in the left paraspinal muscle  Laboratory Data: Lab Results  Component Value Date   WBC 6.6 04/23/2016   HGB 15.3 (H) 04/23/2016   HCT 45.8 04/23/2016   MCV 84.4 04/23/2016   PLT 190.0 04/23/2016    Lab Results  Component Value Date   CREATININE 0.81 04/23/2016    Lab Results  Component Value Date   TSH 1.86 04/23/2016       Component Value Date/Time   CHOL 246 (H) 04/23/2016 0931   HDL 59.90 04/23/2016 0931   CHOLHDL 4 04/23/2016 0931   VLDL 46.4 (H) 04/23/2016 0931    Lab Results  Component Value Date   AST 22 04/23/2016    Lab Results  Component Value Date   ALT 21 04/23/2016     Urinalysis 3-10 RBC's.  See EPIC.    Pertinent Imaging: CLINICAL DATA:  Left-sided flank pain, hematuria yesterday  EXAM: ABDOMEN - 1 VIEW  COMPARISON:  CT abdomen pelvis of 05/21/2009  FINDINGS: Calcifications overlie the left lower pole consistent with left renal calculi. No definite right renal calculi are seen. No calcifications are noted along the expected courses of the ureters. The bowel gas pattern is nonspecific.  IMPRESSION: Left renal calculi. No definite right renal or ureteral calculi are seen.   Electronically Signed   By: Ivar Drape M.D.   On: 09/14/2016 15:12  Assessment & Plan:    1. Left flank pain  - stone seen on today's KUB  - will need to pursue CT Urogram as she has microscopic hematuria  - patient was instructed to RTC after her KUB, but she did not return  - patient will be contacted with results  2. Left renal stone  - see above  Return for patient needs to have a CT Urogram.  These notes generated with voice recognition software. I apologize for typographical errors.  Zara Council, [facility] [address] [place], [place] [address] [phone]

## 2016-09-15 LAB — URINALYSIS, COMPLETE
BILIRUBIN UA: NEGATIVE
GLUCOSE, UA: NEGATIVE
KETONES UA: NEGATIVE
LEUKOCYTES UA: NEGATIVE
Nitrite, UA: NEGATIVE
SPEC GRAV UA: 1.015 (ref 1.005–1.030)
Urobilinogen, Ur: 0.2 mg/dL (ref 0.2–1.0)
pH, UA: 6 (ref 5.0–7.5)

## 2016-09-15 LAB — MICROSCOPIC EXAMINATION

## 2016-09-15 NOTE — Telephone Encounter (Signed)
Spoke with pt in reference to xray, u/a and CT urogram. CT orders placed. Pt voiced understanding.

## 2016-09-16 LAB — CULTURE, URINE COMPREHENSIVE

## 2016-09-17 ENCOUNTER — Telehealth: Payer: Self-pay

## 2016-09-17 NOTE — Telephone Encounter (Signed)
-----   Message from Nori Riis, PA-C sent at 09/16/2016  4:43 PM EST ----- Please notify the patient that her urine culture is negative.  She needs her CT Urogram.

## 2016-09-17 NOTE — Telephone Encounter (Signed)
Spoke with pt husband in reference to ucx results and CT. Husband voiced understanding.

## 2016-09-21 ENCOUNTER — Encounter: Payer: Self-pay | Admitting: Family Medicine

## 2016-09-21 ENCOUNTER — Ambulatory Visit (INDEPENDENT_AMBULATORY_CARE_PROVIDER_SITE_OTHER)
Admission: RE | Admit: 2016-09-21 | Discharge: 2016-09-21 | Disposition: A | Discharge status: Home / Self Care | Payer: Medicare Other | Source: Ambulatory Visit | Attending: Family Medicine | Admitting: Family Medicine

## 2016-09-21 ENCOUNTER — Ambulatory Visit (INDEPENDENT_AMBULATORY_CARE_PROVIDER_SITE_OTHER): Payer: Medicare Other | Admitting: Family Medicine

## 2016-09-21 VITALS — BP 130/68 | HR 71 | Temp 97.8°F | Ht 66.0 in | Wt 150.2 lb

## 2016-09-21 DIAGNOSIS — M545 Low back pain, unspecified: Secondary | ICD-10-CM

## 2016-09-21 DIAGNOSIS — R1011 Right upper quadrant pain: Secondary | ICD-10-CM | POA: Diagnosis not present

## 2016-09-21 DIAGNOSIS — R197 Diarrhea, unspecified: Secondary | ICD-10-CM | POA: Diagnosis not present

## 2016-09-21 LAB — POC URINALSYSI DIPSTICK (AUTOMATED)
BILIRUBIN UA: NEGATIVE
Glucose, UA: NEGATIVE
KETONES UA: NEGATIVE
Leukocytes, UA: NEGATIVE
Nitrite, UA: NEGATIVE
PH UA: 6
Urobilinogen, UA: 0.2

## 2016-09-21 MED ORDER — CYCLOBENZAPRINE HCL 10 MG PO TABS: 5.0000 mg | ORAL_TABLET | Freq: Three times a day (TID) | ORAL | 0 refills | Status: DC | PRN

## 2016-09-21 NOTE — Assessment & Plan Note (Signed)
In epigastric and also RUQ- at times she feels like it radiates from her back  Also loose stools (currently on macrobid)  Improved at the time of exam Still has a gallbladder  Lab for cmet and cbc and amylase/lipase  Consider [place] abd if no imp  Will go to ED if pain becomes severe Doubt a passing kidney stone in light of bland ua

## 2016-09-21 NOTE — Progress Notes (Signed)
Subjective:    Patient ID: Sharon Hester, female    DOB: Jul 09, 1951, 65 y.o.   MRN: [id number]  HPI  Here for abd and back pain   1 1/2 mo ago - pain started R upper abdomen - thought it was muscular because it hurt to take a deep breath  Also wondered if she had a kidney stone  At UC found a uti and a sore /tender rib and high ca oxalate in urine  (macrobid)   Wt Readings from Last 3 Encounters:  09/21/16 150 lb 4 oz (68.2 kg)  09/14/16 149 lb 6.4 oz (67.8 kg)  09/11/16 147 lb (66.7 kg)   Stable   Sees urology [facility])  Recent Ucx neg 12/8 Planning Ct urogram  Hx of ca oxalate kidney stones - lithotripsy in the past  L renal calculi seen on recent xr (not ureteral)   Put her on advil bid and it improved and then moved back to L side and then the middle of her back  Last night she had a pain in R groin - has had it all day today  Feels like a kidney stone  It does hurt to move her hip   Last week had trouble getting up off the floor   Has had diarrhea for a while  Her stool turned light in color just for a day  macrobid - bid for 10 d- now post coital   No blood in stool   Has not had her gallbladder out    Results for orders placed or performed in visit on 09/21/16  POCT Urinalysis Dipstick (Automated)  Result Value Ref Range   Color, UA yellow    Clarity, UA Hazy    Glucose, UA Negative    Bilirubin, UA Negative    Ketones, UA Negative    Spec Grav, UA >=1.030    Blood, UA Trace    pH, UA 6.0    Protein, UA 100 mg/dL    Urobilinogen, UA 0.2    Nitrite, UA Negative    Leukocytes, UA Negative Negative     Patient Active Problem List   Diagnosis Date Noted  . Abdominal pain, right upper quadrant 09/21/2016  . Lumbar pain 09/21/2016  . Diarrhea 09/21/2016  . Contusion, foot 04/23/2016  . ETD (eustachian tube dysfunction) 04/23/2016  . OSTEOPENIA 10/02/2007  . HYPOGLYCEMIA 09/14/2007  . MIGRAINE HEADACHE 09/14/2007  . Allergic rhinitis 09/14/2007    . ASTHMA 09/14/2007  . RENAL CALCULUS 09/14/2007  . FIBROCYSTIC BREAST DISEASE 09/14/2007  . HYPERLIPIDEMIA 08/22/2007  . ADD 08/22/2007  . Essential hypertension 08/22/2007   Past Medical History:  Diagnosis Date  . Allergic rhinitis   . Asthma   . Attention deficit disorder without mention of hyperactivity   . Calculus of kidney   . Cataract   . Diffuse cystic mastopathy   . High cholesterol   . Hypoglycemia, unspecified   . Migraine, unspecified, without mention of intractable migraine without mention of status migrainosus   . Mixed hyperlipidemia   . Osteopenia   . Precancerous lesion   . Unspecified essential hypertension    Past Surgical History:  Procedure Laterality Date  . APPENDECTOMY    . COLONOSCOPY  5/06  . COLONOSCOPY WITH PROPOFOL N/A 05/12/2015   Procedure: COLONOSCOPY WITH PROPOFOL;  Surgeon: Manya Silvas, MD;  Location: [facility] ENDOSCOPY;  Service: Endoscopy;  Laterality: N/A;  . EYE SURGERY     multiple; s/p childhood trauma  . LAPAROSCOPIC HYSTERECTOMY  1/04  . LITHOTRIPSY    . 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 TONSILLECTOMY     Social History  Substance Use Topics  . Smoking status: Never Smoker  . Smokeless tobacco: Never Used  . Alcohol use 0.6 oz/week    1 Shots of liquor per week     Comment: occasional   Family History  Problem Relation Age of Onset  . Diabetes Father   . Colon cancer Father   . Hypertension Father   . Heart attack Father   . Heart attack Mother   . Coronary artery disease Mother   . Pancreatic cancer Mother   . Hyperlipidemia Mother   . Other Brother     Congenital hyperspadias  . Other Brother     loss of kidney function in 1 kidney early in life  . Diabetes      Grandmother  . Coronary artery disease Maternal Grandmother   . Coronary artery disease      Maternal aunts and uncles  . Breast cancer Neg Hx   . Ovarian cancer Neg Hx   . Uterine cancer Neg Hx    Allergies  Allergen Reactions  .  Codeine     REACTION: n/v   Current Outpatient Prescriptions on File Prior to Visit  Medication Sig Dispense Refill  . amLODipine (NORVASC) 5 MG tablet Take 1 tablet (5 mg total) by mouth daily. 90 tablet 3  . cetirizine (ZYRTEC) 10 MG tablet Take 10 mg by mouth daily.      . cholecalciferol (VITAMIN D) 1000 UNITS tablet Take 1,000 Units by mouth daily.      . Coenzyme Q10 (COQ10) 400 MG CAPS Take 1 capsule by mouth daily.      Marland Kitchen estradiol (VIVELLE-DOT) 0.1 MG/24HR patch Place 1 patch (0.1 mg total) onto the skin 2 (two) times a week. 24 patch 3  . Flaxseed, Linseed, (FLAX SEED OIL) 1000 MG CAPS Take 1 capsule by mouth daily.    . fluticasone (FLONASE) 50 MCG/ACT nasal spray Place 2 sprays into both nostrils daily. 48 g 3  . HYDROcodone-acetaminophen (NORCO/VICODIN) 5-325 MG tablet Take 1-2 tablets by mouth every 6 (six) hours as needed. 8 tablet 0  . Multiple Vitamin (MULTIVITAMIN) tablet Take 1 tablet by mouth daily.      . nitrofurantoin, macrocrystal-monohydrate, (MACROBID) 100 MG capsule Take 1 capsule (100 mg total) by mouth 2 (two) times daily. 14 capsule 0  . Omega-3 Fatty Acids (FISH OIL) 1200 MG CAPS Take 1 capsule by mouth 2 (two) times daily.     . Probiotic Product (PROBIOTIC DAILY PO) Take 1 capsule by mouth daily.    . rosuvastatin (CRESTOR) 20 MG tablet Take one and one half tablets by mouth once daily for cholesterol 135 tablet 3  . travoprost, benzalkonium, (TRAVATAN) 0.004 % ophthalmic solution Place 1 drop into the right eye at bedtime.    Marland Kitchen venlafaxine XR (EFFEXOR-XR) 75 MG 24 hr capsule take 1 capsule by mouth once daily 90 capsule 3   No current facility-administered medications on file prior to visit.     Review of Systems  Review of Systems  Constitutional: Negative for fever, appetite change, fatigue and unexpected weight change.  Eyes: Negative for pain and visual disturbance.  Respiratory: Negative for cough and shortness of breath.   Cardiovascular: Negative  for cp or palpitations    Gastrointestinal: Negative for nausea, heartburn  and constipation. neg for blood in stool Genitourinary: Negative for urgency and  frequency. neg for dysuria or hematuria  Skin: Negative for pallor or rash   MSK pos for bilateral lumbar pain worse with movement  Neurological: Negative for weakness, light-headedness, numbness and headaches.  Hematological: Negative for adenopathy. Does not bruise/bleed easily.  Psychiatric/Behavioral: Negative for dysphoric mood. The patient is not nervous/anxious.         Objective:   Physical Exam  Constitutional: She appears well-developed and well-nourished. No distress.  Well appearing   HENT:  Head: Normocephalic and atraumatic.  Mouth/Throat: Oropharynx is clear and moist.  Eyes: Conjunctivae and EOM are normal. Pupils are equal, round, and reactive to light. No scleral icterus.  Neck: Normal range of motion. Neck supple.  Cardiovascular: Normal rate, regular rhythm and normal heart sounds.   Pulmonary/Chest: Effort normal and breath sounds normal. No respiratory distress. She has no wheezes. She has no rales.  Abdominal: Soft. Bowel sounds are normal. She exhibits no distension, no pulsatile liver, no abdominal bruit, no pulsatile midline mass and no mass. There is no hepatosplenomegaly. There is tenderness in the right upper quadrant and epigastric area. There is positive Murphy's sign. There is no rigidity, no rebound, no guarding, no CVA tenderness and no tenderness at McBurney's point.  Soft murphy sign today    Musculoskeletal: She exhibits no edema.       Lumbar back: She exhibits decreased range of motion, tenderness and spasm. She exhibits no bony tenderness and no swelling.  Tender over lumbar musculature worse on the L   No bony tenderness Pain worse to extend spine and lateral bend to the R Nl gait  Neg SLR  No neuro changes   Lymphadenopathy:    She has no cervical adenopathy.  Neurological: She is  alert. She has normal reflexes. She displays no atrophy. No cranial nerve deficit or sensory deficit. She exhibits normal muscle tone. Coordination and gait normal.  Skin: Skin is warm and dry. No erythema. No pallor.  Psychiatric: She has a normal mood and affect.          Assessment & Plan:   Problem List Items Addressed This Visit      Other   Lumbar pain    This seems muscular as it worsens with pos (extension and R lat bend)  Started after yard work Disc stretching and use of heat  Flexeril px for prn use with caution of sedation  LS xr today- pending       Relevant Medications   cyclobenzaprine (FLEXERIL) 10 MG tablet   Other Relevant Orders   DG Lumbar Spine Complete (Completed)   POCT Urinalysis Dipstick (Automated) (Completed)   Diarrhea    Loose /frequent but not liquid stools On macrobid- ? Cause Lab today  Continue to watch       Abdominal pain, right upper quadrant - Primary    In epigastric and also RUQ- at times she feels like it radiates from her back  Also loose stools (currently on macrobid)  Improved at the time of exam Still has a gallbladder  Lab for cmet and cbc and amylase/lipase  Consider [place] abd if no imp  Will go to ED if pain becomes severe Doubt a passing kidney stone in light of bland ua        Relevant Orders   CBC with Differential/Platelet   Comprehensive metabolic panel   Lipase   Amylase

## 2016-09-21 NOTE — Assessment & Plan Note (Signed)
This seems muscular as it worsens with pos (extension and R lat bend)  Started after yard work Disc stretching and use of heat  Flexeril px for prn use with caution of sedation  LS xr today- pending

## 2016-09-21 NOTE — Assessment & Plan Note (Signed)
Loose /frequent but not liquid stools On macrobid- ? Cause Lab today  Continue to watch

## 2016-09-21 NOTE — Patient Instructions (Signed)
Put some heat on the area that hurts  Xray of spine today  Labs today for abdominal pain and diarrhea  Drink fluids   We will update you with results

## 2016-09-21 NOTE — Progress Notes (Signed)
Pre visit review using our clinic review tool, if applicable. No additional management support is needed unless otherwise documented below in the visit note. 

## 2016-09-22 ENCOUNTER — Telehealth: Payer: Self-pay | Admitting: Family Medicine

## 2016-09-22 DIAGNOSIS — N200 Calculus of kidney: Secondary | ICD-10-CM

## 2016-09-22 DIAGNOSIS — R1011 Right upper quadrant pain: Secondary | ICD-10-CM

## 2016-09-22 DIAGNOSIS — R748 Abnormal levels of other serum enzymes: Secondary | ICD-10-CM | POA: Insufficient documentation

## 2016-09-22 DIAGNOSIS — R160 Hepatomegaly, not elsewhere classified: Secondary | ICD-10-CM

## 2016-09-22 LAB — CBC WITH DIFFERENTIAL/PLATELET
BASOS ABS: 0.1 10*3/uL (ref 0.0–0.1)
Basophils Relative: 1.3 % (ref 0.0–3.0)
EOS ABS: 0.2 10*3/uL (ref 0.0–0.7)
Eosinophils Relative: 2.3 % (ref 0.0–5.0)
HCT: 44.4 % (ref 36.0–46.0)
Hemoglobin: 14.9 g/dL (ref 12.0–15.0)
LYMPHS ABS: 1.7 10*3/uL (ref 0.7–4.0)
Lymphocytes Relative: 15.3 % (ref 12.0–46.0)
MCHC: 33.5 g/dL (ref 30.0–36.0)
MCV: 81.5 fl (ref 78.0–100.0)
MONO ABS: 0.9 10*3/uL (ref 0.1–1.0)
MONOS PCT: 8 % (ref 3.0–12.0)
NEUTROS PCT: 73.1 % (ref 43.0–77.0)
Neutro Abs: 7.9 10*3/uL — ABNORMAL HIGH (ref 1.4–7.7)
Platelets: 227 10*3/uL (ref 150.0–400.0)
RBC: 5.45 Mil/uL — AB (ref 3.87–5.11)
RDW: 14.1 % (ref 11.5–15.5)
WBC: 10.9 10*3/uL — ABNORMAL HIGH (ref 4.0–10.5)

## 2016-09-22 LAB — LIPASE: LIPASE: 100 U/L — AB (ref 11.0–59.0)

## 2016-09-22 LAB — COMPREHENSIVE METABOLIC PANEL
ALK PHOS: 181 U/L — AB (ref 39–117)
ALT: 37 U/L — AB (ref 0–35)
AST: 43 U/L — AB (ref 0–37)
Albumin: 4.8 g/dL (ref 3.5–5.2)
BILIRUBIN TOTAL: 0.4 mg/dL (ref 0.2–1.2)
BUN: 22 mg/dL (ref 6–23)
CO2: 29 meq/L (ref 19–32)
CREATININE: 0.94 mg/dL (ref 0.40–1.20)
Calcium: 10.6 mg/dL — ABNORMAL HIGH (ref 8.4–10.5)
Chloride: 103 mEq/L (ref 96–112)
GFR: 63.47 mL/min (ref >60.00)
GLUCOSE: 91 mg/dL (ref 70–99)
Potassium: 3.9 mEq/L (ref 3.5–5.1)
SODIUM: 140 meq/L (ref 135–145)
TOTAL PROTEIN: 7.5 g/dL (ref 6.0–8.3)

## 2016-09-22 LAB — AMYLASE: Amylase: 63 U/L (ref 27–131)

## 2016-09-22 NOTE — Telephone Encounter (Signed)
Please let pt know that liver tests and one pancreas enzyme are mildly elevated so I do wonder if she may have gallstones (which show up better on [place] than CT) Also wbc is elevated- this can indicate inflammation or infection as well   I am going to put in an urgent order for [place] of abdomen and also forward to [facility]

## 2016-09-22 NOTE — Telephone Encounter (Signed)
PA was approved and pharmacy notified letter placed in Dr. Marliss Coots inbox to sign and send for scanning

## 2016-09-22 NOTE — Telephone Encounter (Signed)
Pt notified of lab results and Dr. Marliss Coots comments pt okay with referral and will wait for our [facility] to call to schedule appt

## 2016-09-23 ENCOUNTER — Ambulatory Visit
Admission: RE | Admit: 2016-09-23 | Discharge: 2016-09-23 | Disposition: A | Discharge status: Home / Self Care | Payer: Medicare Other | Source: Ambulatory Visit | Attending: Family Medicine | Admitting: Family Medicine

## 2016-09-23 ENCOUNTER — Other Ambulatory Visit: Payer: Self-pay | Admitting: Family Medicine

## 2016-09-23 ENCOUNTER — Telehealth: Payer: Self-pay

## 2016-09-23 ENCOUNTER — Telehealth: Payer: Self-pay | Admitting: Urology

## 2016-09-23 DIAGNOSIS — R918 Other nonspecific abnormal finding of lung field: Secondary | ICD-10-CM | POA: Insufficient documentation

## 2016-09-23 DIAGNOSIS — C787 Secondary malignant neoplasm of liver and intrahepatic bile duct: Secondary | ICD-10-CM | POA: Diagnosis not present

## 2016-09-23 DIAGNOSIS — I700 Atherosclerosis of aorta: Secondary | ICD-10-CM | POA: Diagnosis not present

## 2016-09-23 DIAGNOSIS — R748 Abnormal levels of other serum enzymes: Secondary | ICD-10-CM

## 2016-09-23 DIAGNOSIS — C799 Secondary malignant neoplasm of unspecified site: Principal | ICD-10-CM

## 2016-09-23 DIAGNOSIS — R1011 Right upper quadrant pain: Secondary | ICD-10-CM

## 2016-09-23 DIAGNOSIS — R590 Localized enlarged lymph nodes: Secondary | ICD-10-CM | POA: Insufficient documentation

## 2016-09-23 DIAGNOSIS — C20 Malignant neoplasm of rectum: Secondary | ICD-10-CM

## 2016-09-23 DIAGNOSIS — R160 Hepatomegaly, not elsewhere classified: Secondary | ICD-10-CM | POA: Diagnosis not present

## 2016-09-23 DIAGNOSIS — N200 Calculus of kidney: Secondary | ICD-10-CM

## 2016-09-23 DIAGNOSIS — N289 Disorder of kidney and ureter, unspecified: Secondary | ICD-10-CM | POA: Diagnosis not present

## 2016-09-23 MED ORDER — IOPAMIDOL (ISOVUE-300) INJECTION 61%
100.0000 mL | Freq: Once | INTRAVENOUS | Status: AC | PRN
  Administered 2016-09-23: 100 mL via INTRAVENOUS

## 2016-09-23 NOTE — Telephone Encounter (Addendum)
Spoke with patient re abnormal [place] - with R solid mass in liver. I see she's UTD on colon and breast cancer screening. Other organs seemed fine on [place].  I see she has CT abd /pelvis w/ and w/o contrast scheduled for Monday by urology - will see if we can expedite this today/tomorrow and I have ordered stat CT abd/pelvis w/ w/o contrast. Will cc PCP.  I did not order MRI hepatic protocol yet.  Will need to cancel CT scheduled for Mon.

## 2016-09-23 NOTE — Telephone Encounter (Signed)
Sharon Hester with [facility] calls to verify report received and reviewed by Dr. Danise Mina.  This Probation officer did verify that results have been reviewed and patient contacted by the Md.

## 2016-09-23 NOTE — Telephone Encounter (Signed)
Maureen with [facility] called today to let you know that this patient had an Ultrasound today and that it showed some kind of Mass and so they cancelled your Ct scan that you ordered and they ordered one for today.  They wanted you to be aware that this was going on.   Sharyn Lull

## 2016-09-23 NOTE — Telephone Encounter (Signed)
US scheduled and patient aware. °

## 2016-09-23 NOTE — Addendum Note (Signed)
Addended by: Ria Bush on: 09/23/2016 09:55 AM   Modules accepted: Orders

## 2016-09-23 NOTE — Telephone Encounter (Signed)
Thanks to both of you for getting this going!

## 2016-09-23 NOTE — Telephone Encounter (Signed)
Anna with [facility] [place] called report; Dr Darnell Level will talk with pt.

## 2016-09-23 NOTE — Telephone Encounter (Signed)
See other phone note

## 2016-09-24 ENCOUNTER — Encounter: Payer: Self-pay | Admitting: *Deleted

## 2016-09-24 ENCOUNTER — Telehealth: Payer: Self-pay | Admitting: Family Medicine

## 2016-09-24 MED ORDER — ONDANSETRON HCL 8 MG PO TABS: 8.0000 mg | ORAL_TABLET | Freq: Three times a day (TID) | ORAL | 0 refills | Status: DC | PRN

## 2016-09-24 MED ORDER — HYDROCODONE-ACETAMINOPHEN 5-325 MG PO TABS: 1.0000 | ORAL_TABLET | Freq: Four times a day (QID) | ORAL | 0 refills | Status: AC | PRN

## 2016-09-24 NOTE — Telephone Encounter (Signed)
-----   Message from Abner Greenspan, MD sent at 09/23/2016  5:11 PM EST ----- I spoke to patient as well - will refill norco when I get to the office in the am - she has oxycodone from past px that she can take if needed tonight  Will also px nausea med  She voiced understanding of all / thanked everyone involved for moving quickly with her work up and has a positive outlook Plan on GI appt in the am

## 2016-09-24 NOTE — Telephone Encounter (Signed)
I printed pain and nausea med px for her  In IN box  Please leave her a message it is ready and she will likely pick them up on the way back from her GI appt

## 2016-09-24 NOTE — Telephone Encounter (Signed)
Pt notified Rxs ready for pick-up 

## 2016-09-27 ENCOUNTER — Encounter
Admission: RE | Disposition: A | Discharge status: Home / Self Care | Payer: Self-pay | Source: Ambulatory Visit | Attending: Unknown Physician Specialty

## 2016-09-27 ENCOUNTER — Ambulatory Visit: Payer: Medicare Other

## 2016-09-27 ENCOUNTER — Ambulatory Visit: Payer: Medicare Other | Admitting: Anesthesiology

## 2016-09-27 ENCOUNTER — Ambulatory Visit
Admission: RE | Admit: 2016-09-27 | Discharge: 2016-09-27 | Disposition: A | Discharge status: Home / Self Care | Payer: Medicare Other | Source: Ambulatory Visit | Attending: Unknown Physician Specialty | Admitting: Unknown Physician Specialty

## 2016-09-27 ENCOUNTER — Other Ambulatory Visit: Payer: Self-pay | Admitting: Student

## 2016-09-27 DIAGNOSIS — Z87442 Personal history of urinary calculi: Secondary | ICD-10-CM | POA: Insufficient documentation

## 2016-09-27 DIAGNOSIS — E782 Mixed hyperlipidemia: Secondary | ICD-10-CM | POA: Insufficient documentation

## 2016-09-27 DIAGNOSIS — Z800 Family history of malignant neoplasm of digestive organs: Secondary | ICD-10-CM | POA: Insufficient documentation

## 2016-09-27 DIAGNOSIS — J309 Allergic rhinitis, unspecified: Secondary | ICD-10-CM | POA: Diagnosis not present

## 2016-09-27 DIAGNOSIS — Z833 Family history of diabetes mellitus: Secondary | ICD-10-CM | POA: Insufficient documentation

## 2016-09-27 DIAGNOSIS — Z90710 Acquired absence of both cervix and uterus: Secondary | ICD-10-CM | POA: Diagnosis not present

## 2016-09-27 DIAGNOSIS — D127 Benign neoplasm of rectosigmoid junction: Secondary | ICD-10-CM | POA: Insufficient documentation

## 2016-09-27 DIAGNOSIS — J45909 Unspecified asthma, uncomplicated: Secondary | ICD-10-CM | POA: Diagnosis not present

## 2016-09-27 DIAGNOSIS — Z79899 Other long term (current) drug therapy: Secondary | ICD-10-CM | POA: Insufficient documentation

## 2016-09-27 DIAGNOSIS — M8580 Other specified disorders of bone density and structure, unspecified site: Secondary | ICD-10-CM | POA: Diagnosis not present

## 2016-09-27 DIAGNOSIS — K640 First degree hemorrhoids: Secondary | ICD-10-CM | POA: Insufficient documentation

## 2016-09-27 DIAGNOSIS — Z885 Allergy status to narcotic agent status: Secondary | ICD-10-CM | POA: Insufficient documentation

## 2016-09-27 DIAGNOSIS — E7800 Pure hypercholesterolemia, unspecified: Secondary | ICD-10-CM | POA: Insufficient documentation

## 2016-09-27 DIAGNOSIS — I10 Essential (primary) hypertension: Secondary | ICD-10-CM | POA: Insufficient documentation

## 2016-09-27 DIAGNOSIS — E162 Hypoglycemia, unspecified: Secondary | ICD-10-CM | POA: Diagnosis not present

## 2016-09-27 DIAGNOSIS — K621 Rectal polyp: Secondary | ICD-10-CM | POA: Diagnosis not present

## 2016-09-27 DIAGNOSIS — Z8249 Family history of ischemic heart disease and other diseases of the circulatory system: Secondary | ICD-10-CM | POA: Diagnosis not present

## 2016-09-27 DIAGNOSIS — F419 Anxiety disorder, unspecified: Secondary | ICD-10-CM | POA: Insufficient documentation

## 2016-09-27 DIAGNOSIS — F909 Attention-deficit hyperactivity disorder, unspecified type: Secondary | ICD-10-CM | POA: Diagnosis not present

## 2016-09-27 DIAGNOSIS — R933 Abnormal findings on diagnostic imaging of other parts of digestive tract: Secondary | ICD-10-CM | POA: Diagnosis present

## 2016-09-27 DIAGNOSIS — R935 Abnormal findings on diagnostic imaging of other abdominal regions, including retroperitoneum: Secondary | ICD-10-CM

## 2016-09-27 DIAGNOSIS — G43909 Migraine, unspecified, not intractable, without status migrainosus: Secondary | ICD-10-CM | POA: Diagnosis not present

## 2016-09-27 HISTORY — DX: Anxiety disorder, unspecified: F41.9

## 2016-09-27 HISTORY — PX: COLONOSCOPY WITH PROPOFOL: SHX5780

## 2016-09-27 SURGERY — COLONOSCOPY WITH PROPOFOL
Anesthesia: General

## 2016-09-27 MED ORDER — PROPOFOL 500 MG/50ML IV EMUL
INTRAVENOUS | Status: DC | PRN
  Administered 2016-09-27: 50 ug/kg/min via INTRAVENOUS

## 2016-09-27 MED ORDER — MIDAZOLAM HCL 5 MG/5ML IJ SOLN
INTRAMUSCULAR | Status: DC | PRN
  Administered 2016-09-27: 1 mg via INTRAVENOUS

## 2016-09-27 MED ORDER — SODIUM CHLORIDE 0.9 % IV SOLN: INTRAVENOUS | Status: DC

## 2016-09-27 MED ORDER — LIDOCAINE HCL (PF) 2 % IJ SOLN
INTRAMUSCULAR | Status: DC | PRN
  Administered 2016-09-27: 50 mg

## 2016-09-27 MED ORDER — PROPOFOL 10 MG/ML IV BOLUS
INTRAVENOUS | Status: DC | PRN
  Administered 2016-09-27: 20 mg via INTRAVENOUS
  Administered 2016-09-27: 50 mg via INTRAVENOUS

## 2016-09-27 MED ORDER — SODIUM CHLORIDE 0.9 % IV SOLN
INTRAVENOUS | Status: DC
  Administered 2016-09-27: 08:00:00 via INTRAVENOUS

## 2016-09-27 MED ORDER — FENTANYL CITRATE (PF) 100 MCG/2ML IJ SOLN
INTRAMUSCULAR | Status: DC | PRN
  Administered 2016-09-27: 50 ug via INTRAVENOUS
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
## 2016-09-27 NOTE — Transfer of Care (Signed)
Immediate Anesthesia Transfer of Care Note  Patient: Sharon Hester  Procedure(s) Performed: Procedure(s): COLONOSCOPY WITH PROPOFOL (N/A)  Patient Location: PACU  Anesthesia Type:General  Level of Consciousness: sedated  Airway & Oxygen Therapy: Patient Spontanous Breathing and Patient connected to nasal cannula oxygen  Post-op Assessment: Report given to RN and Post -op Vital signs reviewed and stable  Post vital signs: Reviewed and stable  Last Vitals:  Vitals:   09/27/16 0801  BP: (!) 145/81  Pulse: 92  Resp: 16    Last Pain:  Vitals:   09/27/16 0801  TempSrc: Tympanic         Complications: No apparent anesthesia complications

## 2016-09-27 NOTE — Op Note (Signed)
[facility] Gastroenterology Patient Name: Sharon Hester Procedure Date: 09/27/2016 8:23 AM MRN: [id number] Account #: [id number] Date of Birth: Jan 07, 1951 Admit Type: Outpatient Age: 65 Room: [facility] ENDO ROOM 4 Gender: Female Note Status: Finalized Procedure:            Colonoscopy Indications:          Abnormal CT of the GI tract Providers:            Manya Silvas, MD Referring MD:         Wynelle Fanny. [place] (Referring MD) Medicines:            Propofol per Anesthesia Complications:        No immediate complications. Procedure:            Pre-Anesthesia Assessment:                       - After reviewing the risks and benefits, the patient                        was deemed in satisfactory condition to undergo the                        procedure.                       After obtaining informed consent, the colonoscope was                        passed under direct vision. Throughout the procedure,                        the patient's blood pressure, pulse, and oxygen                        saturations were monitored continuously. The Olympus                        PCF-H180AL colonoscope ( S#: [id number] ) was introduced                        through the anus and advanced to the the cecum,                        identified by appendiceal orifice and ileocecal valve.                        The colonoscopy was performed without difficulty. The                        patient tolerated the procedure well. The quality of                        the bowel preparation was excellent. Findings:      Terminal ileum normal, all of colon looked normal except rectum and       anorectal area.      Many semi-sessile polyps confluent were found in the rectum These were       biopsied with a cold forceps for histology. The polypoid mucosa did not       look like cancer. Biopsies done 3 times. The polyp tissue came  up to the       anorectal junction      Internal hemorrhoids were found  during endoscopy. The hemorrhoids were       small and Grade I (internal hemorrhoids that do not prolapse).      The terminal ileum appeared normal. Impression:           - Many polyps in the rectum and at the recto-sigmoid                        colon. Biopsied.                       - Internal hemorrhoids.                       - The examined portion of the ileum was normal. Recommendation:       - Await pathology results. Refer to surgeon, need PET                        scan, refer to oncologist. Manya Silvas, MD 09/27/2016 8:57:02 AM This report has been signed electronically. Number of Addenda: 0 Note Initiated On: 09/27/2016 8:23 AM Scope Withdrawal Time: 0 hours 11 minutes 56 seconds  Total Procedure Duration: 0 hours 18 minutes 24 seconds       [facility]

## 2016-09-27 NOTE — Anesthesia Preprocedure Evaluation (Signed)
Anesthesia Evaluation  Patient identified by MRN, date of birth, ID band Patient awake    Reviewed: Allergy & Precautions, NPO status , Patient's Chart, lab work & pertinent test results  Airway Mallampati: III       Dental  (+) Teeth Intact   Pulmonary neg pulmonary ROS, asthma ,    breath sounds clear to auscultation       Cardiovascular Exercise Tolerance: Good hypertension, Pt. on medications  Rhythm:Regular Rate:Normal     Neuro/Psych  Headaches, Anxiety    GI/Hepatic negative GI ROS, Neg liver ROS,   Endo/Other    Renal/GU      Musculoskeletal   Abdominal Normal abdominal exam  (+)   Peds negative pediatric ROS (+)  Hematology negative hematology ROS (+)   Anesthesia Other Findings   Reproductive/Obstetrics                             Anesthesia Physical Anesthesia Plan  ASA: II  Anesthesia Plan: General   Post-op Pain Management:    Induction: Intravenous  Airway Management Planned: Natural Airway and Nasal Cannula  Additional Equipment:   Intra-op Plan:   Post-operative Plan:   Informed Consent: I have reviewed the patients History and Physical, chart, labs and discussed the procedure including the risks, benefits and alternatives for the proposed anesthesia with the patient or authorized representative who has indicated his/her understanding and acceptance.     Plan Discussed with: CRNA  Anesthesia Plan Comments:         Anesthesia Quick Evaluation

## 2016-09-27 NOTE — Anesthesia Postprocedure Evaluation (Signed)
Anesthesia Post Note  Patient: Sharon Hester  Procedure(s) Performed: Procedure(s) (LRB): COLONOSCOPY WITH PROPOFOL (N/A)  Patient location during evaluation: PACU Anesthesia Type: General Level of consciousness: awake Pain management: pain level controlled Vital Signs Assessment: post-procedure vital signs reviewed and stable Respiratory status: spontaneous breathing Cardiovascular status: stable Anesthetic complications: no    Last Vitals:  Vitals:   09/27/16 0914 09/27/16 0924  BP: 138/76 (!) 147/79  Pulse: 78 80  Resp: 13 13  Temp:      Last Pain:  Vitals:   09/27/16 0854  TempSrc: Tympanic                 VAN STAVEREN,Rigo Letts

## 2016-09-28 ENCOUNTER — Encounter: Payer: Self-pay | Admitting: Unknown Physician Specialty

## 2016-09-28 ENCOUNTER — Ambulatory Visit: Payer: Medicare Other | Admitting: Urology

## 2016-09-28 LAB — SURGICAL PATHOLOGY

## 2016-09-30 ENCOUNTER — Encounter: Payer: Self-pay | Admitting: Oncology

## 2016-09-30 ENCOUNTER — Inpatient Hospital Stay: Payer: Medicare Other | Attending: Oncology | Admitting: Oncology

## 2016-09-30 ENCOUNTER — Inpatient Hospital Stay: Payer: Medicare Other

## 2016-09-30 VITALS — BP 143/84 | HR 76 | Temp 97.4°F | Resp 18 | Ht 66.0 in | Wt 148.6 lb

## 2016-09-30 DIAGNOSIS — R1011 Right upper quadrant pain: Secondary | ICD-10-CM

## 2016-09-30 DIAGNOSIS — M899 Disorder of bone, unspecified: Secondary | ICD-10-CM | POA: Diagnosis not present

## 2016-09-30 DIAGNOSIS — I10 Essential (primary) hypertension: Secondary | ICD-10-CM

## 2016-09-30 DIAGNOSIS — Z79899 Other long term (current) drug therapy: Secondary | ICD-10-CM

## 2016-09-30 DIAGNOSIS — E782 Mixed hyperlipidemia: Secondary | ICD-10-CM | POA: Diagnosis not present

## 2016-09-30 DIAGNOSIS — Z800 Family history of malignant neoplasm of digestive organs: Secondary | ICD-10-CM | POA: Diagnosis not present

## 2016-09-30 DIAGNOSIS — K769 Liver disease, unspecified: Secondary | ICD-10-CM

## 2016-09-30 DIAGNOSIS — K640 First degree hemorrhoids: Secondary | ICD-10-CM

## 2016-09-30 DIAGNOSIS — Z87442 Personal history of urinary calculi: Secondary | ICD-10-CM

## 2016-09-30 DIAGNOSIS — Z86010 Personal history of colonic polyps: Secondary | ICD-10-CM | POA: Diagnosis not present

## 2016-09-30 DIAGNOSIS — K6289 Other specified diseases of anus and rectum: Secondary | ICD-10-CM

## 2016-09-30 DIAGNOSIS — C787 Secondary malignant neoplasm of liver and intrahepatic bile duct: Secondary | ICD-10-CM

## 2016-09-30 LAB — PROTIME-INR
INR: 0.95
Prothrombin Time: 12.7 seconds (ref 11.4–15.2)

## 2016-09-30 LAB — APTT: APTT: 33 s (ref 24–36)
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
## 2016-09-30 NOTE — Progress Notes (Signed)
  Oncology Nurse Navigator Documentation Met with Sharon Hester and her spouse prior to and during medical oncology consult. Introduced Therapist, nutritional and provided contact information for any future questions/needs/support/education. Having liver biopsy scheduled and additional lab work today including CEA.  Navigator Location: [facility]Med [facility] (09/30/16 1600) Referral date to RadOnc/MedOnc: 09/28/16 (09/30/16 1600) )Navigator Encounter Type: Initial MedOnc (09/30/16 1600)   Abnormal Finding Date: 09/23/16 (09/30/16 1600)                 Patient Visit Type: MedOnc;Initial (09/30/16 1600) Treatment Phase: Abnormal Scans (negative rectal biopsy) (09/30/16 1600) Barriers/Navigation Needs: No barriers at this time (09/30/16 1600)                Acuity: Level 2 (09/30/16 1600)   Acuity Level 2: Initial guidance, education and coordination as needed;Ongoing guidance and education throughout treatment as needed (09/30/16 1600)     Time Spent with Patient: 30 (09/30/16 1600)

## 2016-09-30 NOTE — Progress Notes (Signed)
New pt with abnl scan.

## 2016-10-01 ENCOUNTER — Ambulatory Visit
Admission: RE | Admit: 2016-10-01 | Discharge: 2016-10-01 | Disposition: A | Discharge status: Home / Self Care | Payer: Medicare Other | Source: Ambulatory Visit | Attending: Student | Admitting: Student

## 2016-10-01 DIAGNOSIS — C775 Secondary and unspecified malignant neoplasm of intrapelvic lymph nodes: Secondary | ICD-10-CM | POA: Insufficient documentation

## 2016-10-01 DIAGNOSIS — K621 Rectal polyp: Secondary | ICD-10-CM | POA: Insufficient documentation

## 2016-10-01 DIAGNOSIS — R918 Other nonspecific abnormal finding of lung field: Secondary | ICD-10-CM | POA: Insufficient documentation

## 2016-10-01 DIAGNOSIS — M8448XA Pathological fracture, other site, initial encounter for fracture: Secondary | ICD-10-CM | POA: Diagnosis not present

## 2016-10-01 DIAGNOSIS — C787 Secondary malignant neoplasm of liver and intrahepatic bile duct: Secondary | ICD-10-CM | POA: Diagnosis not present

## 2016-10-01 DIAGNOSIS — C7951 Secondary malignant neoplasm of bone: Secondary | ICD-10-CM | POA: Diagnosis not present

## 2016-10-01 DIAGNOSIS — R590 Localized enlarged lymph nodes: Secondary | ICD-10-CM | POA: Diagnosis not present

## 2016-10-01 DIAGNOSIS — R935 Abnormal findings on diagnostic imaging of other abdominal regions, including retroperitoneum: Secondary | ICD-10-CM | POA: Insufficient documentation

## 2016-10-01 LAB — GLUCOSE, CAPILLARY: Glucose-Capillary: 104 mg/dL — ABNORMAL HIGH (ref 65–99)

## 2016-10-01 LAB — CEA: CEA: 7.6 ng/mL — AB (ref 0.0–4.7)

## 2016-10-01 MED ORDER — FLUDEOXYGLUCOSE F - 18 (FDG) INJECTION
11.9600 | Freq: Once | INTRAVENOUS | Status: AC | PRN
  Administered 2016-10-01: 11.96 via INTRAVENOUS

## 2016-10-01 NOTE — Telephone Encounter (Signed)
-----   Message from Nori Riis, PA-C sent at 09/27/2016 12:04 PM EST ----- Please schedule a repeated CT of her kidney in 6 months.  Would you schedule a follow up appointment and cancel her appointment on Tuesday?

## 2016-10-01 NOTE — Telephone Encounter (Signed)
Patient does not want to schedule right now. She said she wants to wait until it gets closer to time and see where she is at with all of this. She said she will call me back to schedule the appt. I told her that I would get her ct scan approved and we will get it scheduled in 6 months.   Sharon Hester

## 2016-10-05 ENCOUNTER — Other Ambulatory Visit: Payer: Self-pay | Admitting: *Deleted

## 2016-10-05 ENCOUNTER — Telehealth: Payer: Self-pay | Admitting: *Deleted

## 2016-10-05 DIAGNOSIS — R932 Abnormal findings on diagnostic imaging of liver and biliary tract: Secondary | ICD-10-CM

## 2016-10-05 NOTE — Telephone Encounter (Signed)
Dr. Janese Banks called and gave husband results of pet and then I called to give him appt for liver bx 12/28 arrive at 7 am for procedure at 8. NPO after midngiht and he understands.

## 2016-10-06 ENCOUNTER — Other Ambulatory Visit: Payer: Self-pay | Admitting: Radiology

## 2016-10-07 ENCOUNTER — Ambulatory Visit
Admission: RE | Admit: 2016-10-07 | Discharge: 2016-10-07 | Disposition: A | Discharge status: Home / Self Care | Payer: Medicare Other | Source: Ambulatory Visit | Attending: Oncology | Admitting: Oncology

## 2016-10-07 DIAGNOSIS — Z888 Allergy status to other drugs, medicaments and biological substances status: Secondary | ICD-10-CM | POA: Insufficient documentation

## 2016-10-07 DIAGNOSIS — Z833 Family history of diabetes mellitus: Secondary | ICD-10-CM | POA: Insufficient documentation

## 2016-10-07 DIAGNOSIS — I10 Essential (primary) hypertension: Secondary | ICD-10-CM | POA: Diagnosis not present

## 2016-10-07 DIAGNOSIS — M8580 Other specified disorders of bone density and structure, unspecified site: Secondary | ICD-10-CM | POA: Diagnosis not present

## 2016-10-07 DIAGNOSIS — F419 Anxiety disorder, unspecified: Secondary | ICD-10-CM | POA: Insufficient documentation

## 2016-10-07 DIAGNOSIS — C7A1 Malignant poorly differentiated neuroendocrine tumors: Secondary | ICD-10-CM | POA: Diagnosis not present

## 2016-10-07 DIAGNOSIS — Z87442 Personal history of urinary calculi: Secondary | ICD-10-CM | POA: Insufficient documentation

## 2016-10-07 DIAGNOSIS — Z803 Family history of malignant neoplasm of breast: Secondary | ICD-10-CM | POA: Insufficient documentation

## 2016-10-07 DIAGNOSIS — Z8249 Family history of ischemic heart disease and other diseases of the circulatory system: Secondary | ICD-10-CM | POA: Diagnosis not present

## 2016-10-07 DIAGNOSIS — Z808 Family history of malignant neoplasm of other organs or systems: Secondary | ICD-10-CM | POA: Insufficient documentation

## 2016-10-07 DIAGNOSIS — Z800 Family history of malignant neoplasm of digestive organs: Secondary | ICD-10-CM | POA: Insufficient documentation

## 2016-10-07 DIAGNOSIS — R932 Abnormal findings on diagnostic imaging of liver and biliary tract: Secondary | ICD-10-CM | POA: Insufficient documentation

## 2016-10-07 DIAGNOSIS — E782 Mixed hyperlipidemia: Secondary | ICD-10-CM | POA: Insufficient documentation

## 2016-10-07 DIAGNOSIS — C7951 Secondary malignant neoplasm of bone: Secondary | ICD-10-CM | POA: Diagnosis not present

## 2016-10-07 DIAGNOSIS — R1011 Right upper quadrant pain: Secondary | ICD-10-CM | POA: Diagnosis not present

## 2016-10-07 DIAGNOSIS — Z9889 Other specified postprocedural states: Secondary | ICD-10-CM | POA: Insufficient documentation

## 2016-10-07 DIAGNOSIS — Z79899 Other long term (current) drug therapy: Secondary | ICD-10-CM | POA: Insufficient documentation

## 2016-10-07 DIAGNOSIS — J45909 Unspecified asthma, uncomplicated: Secondary | ICD-10-CM | POA: Diagnosis not present

## 2016-10-07 DIAGNOSIS — C775 Secondary and unspecified malignant neoplasm of intrapelvic lymph nodes: Secondary | ICD-10-CM | POA: Insufficient documentation

## 2016-10-07 HISTORY — DX: Personal history of urinary calculi: Z87.442

## 2016-10-07 LAB — CBC
HEMATOCRIT: 46 % (ref 35.0–47.0)
Hemoglobin: 15.5 g/dL (ref 12.0–16.0)
MCH: 27.1 pg (ref 26.0–34.0)
MCHC: 33.7 g/dL (ref 32.0–36.0)
MCV: 80.3 fL (ref 80.0–100.0)
Platelets: 230 10*3/uL (ref 150–440)
RBC: 5.73 MIL/uL — ABNORMAL HIGH (ref 3.80–5.20)
RDW: 14.4 % (ref 11.5–14.5)
WBC: 10.5 10*3/uL (ref 3.6–11.0)

## 2016-10-07 LAB — APTT: APTT: 32 s (ref 24–36)

## 2016-10-07 LAB — PROTIME-INR
INR: 0.92
PROTHROMBIN TIME: 12.3 s (ref 11.4–15.2)

## 2016-10-07 MED ORDER — SODIUM CHLORIDE 0.9 % IV SOLN
Freq: Once | INTRAVENOUS | Status: AC
  Administered 2016-10-07: 08:00:00 via INTRAVENOUS

## 2016-10-07 MED ORDER — MIDAZOLAM HCL 5 MG/5ML IJ SOLN
INTRAMUSCULAR | Status: AC | PRN
  Administered 2016-10-07: 1 mg via INTRAVENOUS
  Administered 2016-10-07: 0.5 mg via INTRAVENOUS
  Administered 2016-10-07: 1 mg via INTRAVENOUS

## 2016-10-07 MED ORDER — FENTANYL CITRATE (PF) 100 MCG/2ML IJ SOLN
INTRAMUSCULAR | Status: AC
  Filled 2016-10-07: qty 4

## 2016-10-07 MED ORDER — FENTANYL CITRATE (PF) 100 MCG/2ML IJ SOLN
INTRAMUSCULAR | Status: AC | PRN
  Administered 2016-10-07: 50 ug via INTRAVENOUS
  Administered 2016-10-07: 25 ug via INTRAVENOUS

## 2016-10-07 MED ORDER — MIDAZOLAM HCL 5 MG/5ML IJ SOLN
INTRAMUSCULAR | Status: AC
  Filled 2016-10-07: qty 5

## 2016-10-07 NOTE — Procedures (Signed)
Technically successful US guided biopsy of hypermetabolic mass within the caudal aspect of the right lobe of the liver.   EBL: None No immediate complications.   Ronny Bacon, MD Pager #: [phone]  '
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
## 2016-10-12 LAB — SURGICAL PATHOLOGY

## 2016-10-13 ENCOUNTER — Other Ambulatory Visit: Payer: Self-pay | Admitting: Anatomic Pathology & Clinical Pathology

## 2016-10-14 ENCOUNTER — Inpatient Hospital Stay: Payer: Medicare Other

## 2016-10-14 ENCOUNTER — Other Ambulatory Visit: Payer: Self-pay

## 2016-10-14 ENCOUNTER — Inpatient Hospital Stay: Payer: Medicare Other | Attending: Oncology | Admitting: Oncology

## 2016-10-14 ENCOUNTER — Other Ambulatory Visit: Payer: Self-pay | Admitting: *Deleted

## 2016-10-14 ENCOUNTER — Ambulatory Visit: Payer: Medicare Other

## 2016-10-14 VITALS — BP 161/92 | HR 86 | Temp 96.9°F | Resp 18 | Wt 145.6 lb

## 2016-10-14 DIAGNOSIS — K5900 Constipation, unspecified: Secondary | ICD-10-CM | POA: Insufficient documentation

## 2016-10-14 DIAGNOSIS — C7951 Secondary malignant neoplasm of bone: Secondary | ICD-10-CM | POA: Diagnosis not present

## 2016-10-14 DIAGNOSIS — N200 Calculus of kidney: Secondary | ICD-10-CM | POA: Insufficient documentation

## 2016-10-14 DIAGNOSIS — I10 Essential (primary) hypertension: Secondary | ICD-10-CM | POA: Insufficient documentation

## 2016-10-14 DIAGNOSIS — M8580 Other specified disorders of bone density and structure, unspecified site: Secondary | ICD-10-CM | POA: Diagnosis not present

## 2016-10-14 DIAGNOSIS — I700 Atherosclerosis of aorta: Secondary | ICD-10-CM | POA: Diagnosis not present

## 2016-10-14 DIAGNOSIS — Z5111 Encounter for antineoplastic chemotherapy: Secondary | ICD-10-CM | POA: Insufficient documentation

## 2016-10-14 DIAGNOSIS — D72829 Elevated white blood cell count, unspecified: Secondary | ICD-10-CM | POA: Diagnosis not present

## 2016-10-14 DIAGNOSIS — R110 Nausea: Secondary | ICD-10-CM

## 2016-10-14 DIAGNOSIS — C7A026 Malignant carcinoid tumor of the rectum: Secondary | ICD-10-CM | POA: Diagnosis not present

## 2016-10-14 DIAGNOSIS — G893 Neoplasm related pain (acute) (chronic): Secondary | ICD-10-CM

## 2016-10-14 DIAGNOSIS — R109 Unspecified abdominal pain: Secondary | ICD-10-CM | POA: Diagnosis not present

## 2016-10-14 DIAGNOSIS — K640 First degree hemorrhoids: Secondary | ICD-10-CM | POA: Insufficient documentation

## 2016-10-14 DIAGNOSIS — C7A1 Malignant poorly differentiated neuroendocrine tumors: Secondary | ICD-10-CM

## 2016-10-14 DIAGNOSIS — C778 Secondary and unspecified malignant neoplasm of lymph nodes of multiple regions: Secondary | ICD-10-CM | POA: Insufficient documentation

## 2016-10-14 DIAGNOSIS — E782 Mixed hyperlipidemia: Secondary | ICD-10-CM | POA: Insufficient documentation

## 2016-10-14 DIAGNOSIS — R5383 Other fatigue: Secondary | ICD-10-CM | POA: Insufficient documentation

## 2016-10-14 DIAGNOSIS — R4781 Slurred speech: Secondary | ICD-10-CM | POA: Insufficient documentation

## 2016-10-14 DIAGNOSIS — Z79899 Other long term (current) drug therapy: Secondary | ICD-10-CM | POA: Insufficient documentation

## 2016-10-14 DIAGNOSIS — Z7189 Other specified counseling: Secondary | ICD-10-CM | POA: Insufficient documentation

## 2016-10-14 DIAGNOSIS — G9389 Other specified disorders of brain: Secondary | ICD-10-CM | POA: Insufficient documentation

## 2016-10-14 DIAGNOSIS — Z885 Allergy status to narcotic agent status: Secondary | ICD-10-CM | POA: Insufficient documentation

## 2016-10-14 DIAGNOSIS — R5381 Other malaise: Secondary | ICD-10-CM | POA: Diagnosis not present

## 2016-10-14 DIAGNOSIS — Z90710 Acquired absence of both cervix and uterus: Secondary | ICD-10-CM | POA: Insufficient documentation

## 2016-10-14 DIAGNOSIS — R112 Nausea with vomiting, unspecified: Secondary | ICD-10-CM

## 2016-10-14 DIAGNOSIS — Z87442 Personal history of urinary calculi: Secondary | ICD-10-CM | POA: Insufficient documentation

## 2016-10-14 DIAGNOSIS — Z7952 Long term (current) use of systemic steroids: Secondary | ICD-10-CM | POA: Insufficient documentation

## 2016-10-14 DIAGNOSIS — C787 Secondary malignant neoplasm of liver and intrahepatic bile duct: Secondary | ICD-10-CM | POA: Insufficient documentation

## 2016-10-14 DIAGNOSIS — Z800 Family history of malignant neoplasm of digestive organs: Secondary | ICD-10-CM | POA: Insufficient documentation

## 2016-10-14 DIAGNOSIS — M549 Dorsalgia, unspecified: Secondary | ICD-10-CM | POA: Insufficient documentation

## 2016-10-14 LAB — COMPREHENSIVE METABOLIC PANEL
ALBUMIN: 4.1 g/dL (ref 3.5–5.0)
ALK PHOS: 281 U/L — AB (ref 38–126)
ALT: 29 U/L (ref 14–54)
AST: 48 U/L — ABNORMAL HIGH (ref 15–41)
Anion gap: 7 (ref 5–15)
BUN: 21 mg/dL — AB (ref 6–20)
CO2: 30 mmol/L (ref 22–32)
CREATININE: 0.99 mg/dL (ref 0.44–1.00)
Calcium: 12.5 mg/dL — ABNORMAL HIGH (ref 8.9–10.3)
Chloride: 101 mmol/L (ref 101–111)
GFR calc Af Amer: >60 mL/min (ref >60)
GFR calc non Af Amer: 59 mL/min — ABNORMAL LOW (ref >60)
GLUCOSE: 151 mg/dL — AB (ref 65–99)
Potassium: 4.5 mmol/L (ref 3.5–5.1)
SODIUM: 138 mmol/L (ref 135–145)
Total Bilirubin: 0.7 mg/dL (ref 0.3–1.2)
Total Protein: 7.8 g/dL (ref 6.5–8.1)

## 2016-10-14 LAB — CBC WITH DIFFERENTIAL/PLATELET
BASOS PCT: 0 %
Basophils Absolute: 0.1 10*3/uL (ref 0–0.1)
EOS ABS: 0 10*3/uL (ref 0–0.7)
Eosinophils Relative: 0 %
HCT: 45.1 % (ref 35.0–47.0)
HEMOGLOBIN: 14.9 g/dL (ref 12.0–16.0)
LYMPHS ABS: 1 10*3/uL (ref 1.0–3.6)
Lymphocytes Relative: 6 %
MCH: 26.7 pg (ref 26.0–34.0)
MCHC: 33.1 g/dL (ref 32.0–36.0)
MCV: 80.7 fL (ref 80.0–100.0)
Monocytes Absolute: 1 10*3/uL — ABNORMAL HIGH (ref 0.2–0.9)
Monocytes Relative: 7 %
NEUTROS ABS: 13.2 10*3/uL — AB (ref 1.4–6.5)
NEUTROS PCT: 87 %
Platelets: 187 10*3/uL (ref 150–440)
RBC: 5.59 MIL/uL — AB (ref 3.80–5.20)
RDW: 14.7 % — ABNORMAL HIGH (ref 11.5–14.5)
WBC: 15.4 10*3/uL — AB (ref 3.6–11.0)

## 2016-10-14 MED ORDER — PROCHLORPERAZINE MALEATE 10 MG PO TABS
10.0000 mg | ORAL_TABLET | Freq: Once | ORAL | Status: DC
  Administered 2016-10-14: 10 mg via ORAL
  Filled 2016-10-14: qty 1

## 2016-10-14 MED ORDER — MORPHINE SULFATE 15 MG PO TABS: 15.0000 mg | ORAL_TABLET | ORAL | 0 refills | Status: DC | PRN

## 2016-10-14 MED ORDER — PROCHLORPERAZINE MALEATE 10 MG PO TABS: 10.0000 mg | ORAL_TABLET | Freq: Four times a day (QID) | ORAL | 0 refills | Status: DC | PRN

## 2016-10-14 MED ORDER — MORPHINE SULFATE ER 15 MG PO TBCR: 15.0000 mg | EXTENDED_RELEASE_TABLET | Freq: Two times a day (BID) | ORAL | 0 refills | Status: DC

## 2016-10-14 MED ORDER — DEXAMETHASONE 4 MG PO TABS: 4.0000 mg | ORAL_TABLET | Freq: Two times a day (BID) | ORAL | 0 refills | Status: DC

## 2016-10-14 MED ORDER — SODIUM CHLORIDE 0.9 % IV SOLN
Freq: Once | INTRAVENOUS | Status: AC
  Administered 2016-10-14: 14:00:00 via INTRAVENOUS
  Filled 2016-10-14: qty 2

## 2016-10-14 MED ORDER — ONDANSETRON HCL 4 MG PO TABS: 4.0000 mg | ORAL_TABLET | Freq: Four times a day (QID) | ORAL | 0 refills | Status: DC | PRN

## 2016-10-14 MED ORDER — SODIUM CHLORIDE 0.9 % IV SOLN
INTRAVENOUS | Status: DC
  Administered 2016-10-14: 14:00:00 via INTRAVENOUS
  Filled 2016-10-14 (×2): qty 1000

## 2016-10-14 NOTE — Progress Notes (Signed)
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 proceed as planned. I will plan to start chemotherapy early next week and see her 1 week after her chemotherapy to see how she is tolerating it as well as she needs any IV fluids. I will also send out [facility] testing to see what our options would be down the line.  2. Nausea which could be secondary to her underlying tumor versus opioid-induced nausea. We will give her 1 L of IV fluids today in our infusion along with 4 mg IV Zofran. We will prescribe 4 mg every 6 when necessary Zofran along with Compazine 10 mg by mouth every 6 when necessary for her nausea. If this does not work I will consider adding Zyprexa to her regimen.  3. Neoplasm associated pain- patient reports nausea and drowsiness and possible slurring of speech and starting fentanyl. I will therefore hold off on  continuing fentanyl and switch her to morphine 15 mg SR twice a day along with 15 mg IR every 4 when necessary for pain. She has not tried morphine in the past and although she has reported allergy to codeine she has been using hydrocodone without significant issues.  4. Today we will obtain CBC, CMP and CEA. I  will also be getting an MRI of the brain to rule out any metastatic disease. Overall she does not have any significant CNS symptoms and hence even if she has brain metastases I would be inclined to proceed with systemic chemotherapy instead of whole brain radiation at this time. Patient does not desire port placement at this time and would like to get chemotherapy from peripheral Iv.     Total face to face encounter time for this patient visit was 45 min. >50% of the time was  spent in counseling and coordination of care.      Visit Diagnosis 1. 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 Neoplasm related pain   3. Nausea and vomiting, intractability of vomiting not specified, unspecified vomiting type      Dr. Randa Evens, MD, MPH [place] [facility] Pager- [phone] 10/14/2016 1:33 PM

## 2016-10-14 NOTE — Patient Instructions (Signed)
Etoposide, VP-16 injection What is this medicine? ETOPOSIDE, VP-16 (e toe POE side) is a chemotherapy drug. It is used to treat testicular cancer, lung cancer, and other cancers. This medicine may be used for other purposes; ask your health care provider or pharmacist if you have questions. COMMON BRAND NAME(S): Etopophos, Toposar, VePesid What should I tell my health care provider before I take this medicine? They need to know if you have any of these conditions: -infection -kidney disease -liver disease -low blood counts, like low white cell, platelet, or red cell counts -an unusual or allergic reaction to etoposide, other medicines, foods, dyes, or preservatives -pregnant or trying to get pregnant -breast-feeding How should I use this medicine? This medicine is for infusion into a vein. It is administered in a hospital or clinic by a specially trained health care professional. Talk to your pediatrician regarding the use of this medicine in children. Special care may be needed. Overdosage: If you think you have taken too much of this medicine contact a poison control center or emergency room at once. NOTE: This medicine is only for you. Do not share this medicine with others. What if I miss a dose? It is important not to miss your dose. Call your doctor or health care professional if you are unable to keep an appointment. What may interact with this medicine? -aspirin -certain medications for seizures like carbamazepine, phenobarbital, phenytoin, valproic acid -cyclosporine -levamisole -warfarin This list may not describe all possible interactions. Give your health care provider a list of all the medicines, herbs, non-prescription drugs, or dietary supplements you use. Also tell them if you smoke, drink alcohol, or use illegal drugs. Some items may interact with your medicine. What should I watch for while using this medicine? Visit your doctor for checks on your progress. This drug  may make you feel generally unwell. This is not uncommon, as chemotherapy can affect healthy cells as well as cancer cells. Report any side effects. Continue your course of treatment even though you feel ill unless your doctor tells you to stop. In some cases, you may be given additional medicines to help with side effects. Follow all directions for their use. Call your doctor or health care professional for advice if you get a fever, chills or sore throat, or other symptoms of a cold or flu. Do not treat yourself. This drug decreases your body's ability to fight infections. Try to avoid being around people who are sick. This medicine may increase your risk to bruise or bleed. Call your doctor or health care professional if you notice any unusual bleeding. Talk to your doctor about your risk of cancer. You may be more at risk for certain types of cancers if you take this medicine. Do not become pregnant while taking this medicine or for at least 6 months after stopping it. Women should inform their doctor if they wish to become pregnant or think they might be pregnant. Women of child-bearing potential will need to have a negative pregnancy test before starting this medicine. There is a potential for serious side effects to an unborn child. Talk to your health care professional or pharmacist for more information. Do not breast-feed an infant while taking this medicine. Men must use a latex condom during sexual contact with a woman while taking this medicine and for at least 4 months after stopping it. A latex condom is needed even if you have had a vasectomy. Contact your doctor right away if your partner becomes pregnant. Do   not donate sperm while taking this medicine and for at least 4 months after you stop taking this medicine. Men should inform their doctors if they wish to father a child. This medicine may lower sperm counts. What side effects may I notice from receiving this medicine? Side effects that  you should report to your doctor or health care professional as soon as possible: -allergic reactions like skin rash, itching or hives, swelling of the face, lips, or tongue -low blood counts - this medicine may decrease the number of white blood cells, red blood cells and platelets. You may be at increased risk for infections and bleeding. -signs of infection - fever or chills, cough, sore throat, pain or difficulty passing urine -signs of decreased platelets or bleeding - bruising, pinpoint red spots on the skin, black, tarry stools, blood in the urine -signs of decreased red blood cells - unusually weak or tired, fainting spells, lightheadedness -breathing problems -changes in vision -mouth or throat sores or ulcers -pain, redness, swelling or irritation at the injection site -pain, tingling, numbness in the hands or feet -redness, blistering, peeling or loosening of the skin, including inside the mouth -seizures -vomiting Side effects that usually do not require medical attention (report to your doctor or health care professional if they continue or are bothersome): -diarrhea -hair loss -loss of appetite -nausea -stomach pain This list may not describe all possible side effects. Call your doctor for medical advice about side effects. You may report side effects to FDA at 1-800-FDA-1088. Where should I keep my medicine? This drug is given in a hospital or clinic and will not be stored at home. NOTE: This sheet is a summary. It may not cover all possible information. If you have questions about this medicine, talk to your doctor, pharmacist, or health care provider.  2017 Elsevier/Gold Standard (2015-09-19 11:53:23) Etoposide, VP-16 injection What is this medicine? ETOPOSIDE, VP-16 (e toe POE side) is a chemotherapy drug. It is used to treat testicular cancer, lung cancer, and other cancers. This medicine may be used for other purposes; ask your health care provider or pharmacist if  you have questions. COMMON BRAND NAME(S): Etopophos, Toposar, VePesid What should I tell my health care provider before I take this medicine? They need to know if you have any of these conditions: -infection -kidney disease -liver disease -low blood counts, like low white cell, platelet, or red cell counts -an unusual or allergic reaction to etoposide, other medicines, foods, dyes, or preservatives -pregnant or trying to get pregnant -breast-feeding How should I use this medicine? This medicine is for infusion into a vein. It is administered in a hospital or clinic by a specially trained health care professional. Talk to your pediatrician regarding the use of this medicine in children. Special care may be needed. Overdosage: If you think you have taken too much of this medicine contact a poison control center or emergency room at once. NOTE: This medicine is only for you. Do not share this medicine with others. What if I miss a dose? It is important not to miss your dose. Call your doctor or health care professional if you are unable to keep an appointment. What may interact with this medicine? -aspirin -certain medications for seizures like carbamazepine, phenobarbital, phenytoin, valproic acid -cyclosporine -levamisole -warfarin This list may not describe all possible interactions. Give your health care provider a list of all the medicines, herbs, non-prescription drugs, or dietary supplements you use. Also tell them if you smoke, drink alcohol, or  use illegal drugs. Some items may interact with your medicine. What should I watch for while using this medicine? Visit your doctor for checks on your progress. This drug may make you feel generally unwell. This is not uncommon, as chemotherapy can affect healthy cells as well as cancer cells. Report any side effects. Continue your course of treatment even though you feel ill unless your doctor tells you to stop. In some cases, you may be  given additional medicines to help with side effects. Follow all directions for their use. Call your doctor or health care professional for advice if you get a fever, chills or sore throat, or other symptoms of a cold or flu. Do not treat yourself. This drug decreases your body's ability to fight infections. Try to avoid being around people who are sick. This medicine may increase your risk to bruise or bleed. Call your doctor or health care professional if you notice any unusual bleeding. Talk to your doctor about your risk of cancer. You may be more at risk for certain types of cancers if you take this medicine. Do not become pregnant while taking this medicine or for at least 6 months after stopping it. Women should inform their doctor if they wish to become pregnant or think they might be pregnant. Women of child-bearing potential will need to have a negative pregnancy test before starting this medicine. There is a potential for serious side effects to an unborn child. Talk to your health care professional or pharmacist for more information. Do not breast-feed an infant while taking this medicine. Men must use a latex condom during sexual contact with a woman while taking this medicine and for at least 4 months after stopping it. A latex condom is needed even if you have had a vasectomy. Contact your doctor right away if your partner becomes pregnant. Do not donate sperm while taking this medicine and for at least 4 months after you stop taking this medicine. Men should inform their doctors if they wish to father a child. This medicine may lower sperm counts. What side effects may I notice from receiving this medicine? Side effects that you should report to your doctor or health care professional as soon as possible: -allergic reactions like skin rash, itching or hives, swelling of the face, lips, or tongue -low blood counts - this medicine may decrease the number of white blood cells, red blood cells  and platelets. You may be at increased risk for infections and bleeding. -signs of infection - fever or chills, cough, sore throat, pain or difficulty passing urine -signs of decreased platelets or bleeding - bruising, pinpoint red spots on the skin, black, tarry stools, blood in the urine -signs of decreased red blood cells - unusually weak or tired, fainting spells, lightheadedness -breathing problems -changes in vision -mouth or throat sores or ulcers -pain, redness, swelling or irritation at the injection site -pain, tingling, numbness in the hands or feet -redness, blistering, peeling or loosening of the skin, including inside the mouth -seizures -vomiting Side effects that usually do not require medical attention (report to your doctor or health care professional if they continue or are bothersome): -diarrhea -hair loss -loss of appetite -nausea -stomach pain This list may not describe all possible side effects. Call your doctor for medical advice about side effects. You may report side effects to FDA at 1-800-FDA-1088. Where should I keep my medicine? This drug is given in a hospital or clinic and will not be stored at home.  NOTE: This sheet is a summary. It may not cover all possible information. If you have questions about this medicine, talk to your doctor, pharmacist, or health care provider.  2017 Elsevier/Gold Standard (2015-09-19 11:53:23) Carboplatin injection What is this medicine? CARBOPLATIN (KAR boe pla tin) is a chemotherapy drug. It targets fast dividing cells, like cancer cells, and causes these cells to die. This medicine is used to treat ovarian cancer and many other cancers. This medicine may be used for other purposes; ask your health care provider or pharmacist if you have questions. COMMON BRAND NAME(S): Paraplatin What should I tell my health care provider before I take this medicine? They need to know if you have any of these conditions: -blood  disorders -hearing problems -kidney disease -recent or ongoing radiation therapy -an unusual or allergic reaction to carboplatin, cisplatin, other chemotherapy, other medicines, foods, dyes, or preservatives -pregnant or trying to get pregnant -breast-feeding How should I use this medicine? This drug is usually given as an infusion into a vein. It is administered in a hospital or clinic by a specially trained health care professional. Talk to your pediatrician regarding the use of this medicine in children. Special care may be needed. Overdosage: If you think you have taken too much of this medicine contact a poison control center or emergency room at once. NOTE: This medicine is only for you. Do not share this medicine with others. What if I miss a dose? It is important not to miss a dose. Call your doctor or health care professional if you are unable to keep an appointment. What may interact with this medicine? -medicines for seizures -medicines to increase blood counts like filgrastim, pegfilgrastim, sargramostim -some antibiotics like amikacin, gentamicin, neomycin, streptomycin, tobramycin -vaccines Talk to your doctor or health care professional before taking any of these medicines: -acetaminophen -aspirin -ibuprofen -ketoprofen -naproxen This list may not describe all possible interactions. Give your health care provider a list of all the medicines, herbs, non-prescription drugs, or dietary supplements you use. Also tell them if you smoke, drink alcohol, or use illegal drugs. Some items may interact with your medicine. What should I watch for while using this medicine? Your condition will be monitored carefully while you are receiving this medicine. You will need important blood work done while you are taking this medicine. This drug may make you feel generally unwell. This is not uncommon, as chemotherapy can affect healthy cells as well as cancer cells. Report any side effects.  Continue your course of treatment even though you feel ill unless your doctor tells you to stop. In some cases, you may be given additional medicines to help with side effects. Follow all directions for their use. Call your doctor or health care professional for advice if you get a fever, chills or sore throat, or other symptoms of a cold or flu. Do not treat yourself. This drug decreases your body's ability to fight infections. Try to avoid being around people who are sick. This medicine may increase your risk to bruise or bleed. Call your doctor or health care professional if you notice any unusual bleeding. Be careful brushing and flossing your teeth or using a toothpick because you may get an infection or bleed more easily. If you have any dental work done, tell your dentist you are receiving this medicine. Avoid taking products that contain aspirin, acetaminophen, ibuprofen, naproxen, or ketoprofen unless instructed by your doctor. These medicines may hide a fever. Do not become pregnant while taking this medicine.  Women should inform their doctor if they wish to become pregnant or think they might be pregnant. There is a potential for serious side effects to an unborn child. Talk to your health care professional or pharmacist for more information. Do not breast-feed an infant while taking this medicine. What side effects may I notice from receiving this medicine? Side effects that you should report to your doctor or health care professional as soon as possible: -allergic reactions like skin rash, itching or hives, swelling of the face, lips, or tongue -signs of infection - fever or chills, cough, sore throat, pain or difficulty passing urine -signs of decreased platelets or bleeding - bruising, pinpoint red spots on the skin, black, tarry stools, nosebleeds -signs of decreased red blood cells - unusually weak or tired, fainting spells, lightheadedness -breathing problems -changes in  hearing -changes in vision -chest pain -high blood pressure -low blood counts - This drug may decrease the number of white blood cells, red blood cells and platelets. You may be at increased risk for infections and bleeding. -nausea and vomiting -pain, swelling, redness or irritation at the injection site -pain, tingling, numbness in the hands or feet -problems with balance, talking, walking -trouble passing urine or change in the amount of urine Side effects that usually do not require medical attention (report to your doctor or health care professional if they continue or are bothersome): -hair loss -loss of appetite -metallic taste in the mouth or changes in taste This list may not describe all possible side effects. Call your doctor for medical advice about side effects. You may report side effects to FDA at 1-800-FDA-1088. Where should I keep my medicine? This drug is given in a hospital or clinic and will not be stored at home. NOTE: This sheet is a summary. It may not cover all possible information. If you have questions about this medicine, talk to your doctor, pharmacist, or health care provider.  2017 Elsevier/Gold Standard (2008-01-02 14:38:05)

## 2016-10-14 NOTE — Progress Notes (Signed)
Per pt and husband voiced frustration w wanting to know biopsy results . Dr Janese Banks came to see pt

## 2016-10-15 ENCOUNTER — Inpatient Hospital Stay: Payer: Medicare Other | Admitting: Oncology

## 2016-10-15 ENCOUNTER — Ambulatory Visit
Admission: RE | Admit: 2016-10-15 | Discharge: 2016-10-15 | Disposition: A | Discharge status: Home / Self Care | Payer: Medicare Other | Source: Ambulatory Visit | Attending: Oncology | Admitting: Oncology

## 2016-10-15 DIAGNOSIS — C7A1 Malignant poorly differentiated neuroendocrine tumors: Secondary | ICD-10-CM

## 2016-10-15 DIAGNOSIS — G9389 Other specified disorders of brain: Secondary | ICD-10-CM | POA: Diagnosis not present

## 2016-10-15 DIAGNOSIS — R112 Nausea with vomiting, unspecified: Secondary | ICD-10-CM | POA: Insufficient documentation

## 2016-10-15 DIAGNOSIS — G893 Neoplasm related pain (acute) (chronic): Secondary | ICD-10-CM | POA: Insufficient documentation

## 2016-10-15 LAB — CEA: CEA: 10.8 ng/mL — ABNORMAL HIGH (ref 0.0–4.7)

## 2016-10-15 MED ORDER — GADOBENATE DIMEGLUMINE 529 MG/ML IV SOLN
15.0000 mL | Freq: Once | INTRAVENOUS | Status: AC | PRN
  Administered 2016-10-15: 13 mL via INTRAVENOUS

## 2016-10-15 NOTE — Progress Notes (Signed)
  Oncology Nurse Navigator Documentation Attended 1/4 office visit with Mr. And Mrs Eavey for pathology results. They need lots of support and education during this difficult time. Allowed ample time for venting. Provided copy of pathology report and PET scan. Navigator Location: [facility] (10/15/16 1000)   )Navigator Encounter Type: Follow-up Appt;Diagnostic Results (10/15/16 1000)                       Treatment Phase: Pre-Tx/Tx Discussion (10/15/16 1000) Barriers/Navigation Needs: Education (10/15/16 1000)                          Time Spent with Patient: 60 (10/15/16 1000)

## 2016-10-18 ENCOUNTER — Other Ambulatory Visit: Payer: Self-pay | Admitting: *Deleted

## 2016-10-18 ENCOUNTER — Inpatient Hospital Stay: Payer: Medicare Other

## 2016-10-18 VITALS — BP 152/78 | HR 84 | Temp 97.1°F | Resp 18

## 2016-10-18 DIAGNOSIS — C7A1 Malignant poorly differentiated neuroendocrine tumors: Secondary | ICD-10-CM

## 2016-10-18 DIAGNOSIS — C7A026 Malignant carcinoid tumor of the rectum: Secondary | ICD-10-CM | POA: Diagnosis not present

## 2016-10-18 MED ORDER — SODIUM CHLORIDE 0.9 % IV SOLN
10.0000 mg | Freq: Once | INTRAVENOUS | Status: DC
Start: 2016-10-18 — End: 2016-10-18

## 2016-10-18 MED ORDER — SODIUM CHLORIDE 0.9 % IV SOLN
100.0000 mg/m2 | Freq: Once | INTRAVENOUS | Status: AC
  Administered 2016-10-18: 180 mg via INTRAVENOUS
  Filled 2016-10-18: qty 9

## 2016-10-18 MED ORDER — PALONOSETRON HCL INJECTION 0.25 MG/5ML
0.2500 mg | Freq: Once | INTRAVENOUS | Status: AC
  Administered 2016-10-18: 0.25 mg via INTRAVENOUS
  Filled 2016-10-18: qty 5

## 2016-10-18 MED ORDER — SODIUM CHLORIDE 0.9 % IV SOLN
Freq: Once | INTRAVENOUS | Status: AC
  Administered 2016-10-18: 10:00:00 via INTRAVENOUS
  Filled 2016-10-18: qty 1000

## 2016-10-18 MED ORDER — DEXAMETHASONE SODIUM PHOSPHATE 10 MG/ML IJ SOLN
10.0000 mg | Freq: Once | INTRAMUSCULAR | Status: AC
  Administered 2016-10-18: 10 mg via INTRAVENOUS
  Filled 2016-10-18: qty 1

## 2016-10-18 MED ORDER — CARBOPLATIN CHEMO INJECTION 600 MG/60ML
417.5000 mg | Freq: Once | INTRAVENOUS | Status: AC
  Administered 2016-10-18: 420 mg via INTRAVENOUS
  Filled 2016-10-18: qty 42

## 2016-10-18 NOTE — Telephone Encounter (Signed)
I have called [facility] to ask about narcan prescription that we did not order.  The pharmacist at the [facility] said that the daughter of this pt works at [employer] as a Software engineer and felt she wanted it for her mother since she was on so many narcotics.  She will have daughter call back tom. And speak to me so that I can ask Dr. Janese Banks about the rx. Whether she feels like pt needs this rx.

## 2016-10-18 NOTE — Telephone Encounter (Signed)
Refill request to refill Narcan Nasal spray. I do not see this on her med list. Please advise

## 2016-10-19 ENCOUNTER — Inpatient Hospital Stay: Payer: Medicare Other

## 2016-10-19 ENCOUNTER — Encounter (INDEPENDENT_AMBULATORY_CARE_PROVIDER_SITE_OTHER): Payer: Self-pay

## 2016-10-19 VITALS — BP 122/79 | HR 79 | Resp 20

## 2016-10-19 DIAGNOSIS — C7A1 Malignant poorly differentiated neuroendocrine tumors: Secondary | ICD-10-CM

## 2016-10-19 DIAGNOSIS — C7A026 Malignant carcinoid tumor of the rectum: Secondary | ICD-10-CM | POA: Diagnosis not present

## 2016-10-19 MED ORDER — SODIUM CHLORIDE 0.9 % IV SOLN
Freq: Once | INTRAVENOUS | Status: AC
  Administered 2016-10-19: 14:00:00 via INTRAVENOUS
  Filled 2016-10-19: qty 1000

## 2016-10-19 MED ORDER — SODIUM CHLORIDE 0.9 % IV SOLN: 10.0000 mg | Freq: Once | INTRAVENOUS | Status: DC

## 2016-10-19 MED ORDER — DEXAMETHASONE SODIUM PHOSPHATE 10 MG/ML IJ SOLN
10.0000 mg | Freq: Once | INTRAMUSCULAR | Status: AC
  Administered 2016-10-19: 10 mg via INTRAVENOUS
  Filled 2016-10-19: qty 1

## 2016-10-19 MED ORDER — ETOPOSIDE CHEMO INJECTION 1 GM/50ML
100.0000 mg/m2 | Freq: Once | INTRAVENOUS | Status: AC
  Administered 2016-10-19: 180 mg via INTRAVENOUS
  Filled 2016-10-19: qty 9

## 2016-10-20 ENCOUNTER — Inpatient Hospital Stay: Payer: Medicare Other

## 2016-10-20 VITALS — BP 138/68 | HR 85 | Temp 98.5°F | Resp 18

## 2016-10-20 DIAGNOSIS — C7A026 Malignant carcinoid tumor of the rectum: Secondary | ICD-10-CM | POA: Diagnosis not present

## 2016-10-20 DIAGNOSIS — C7A1 Malignant poorly differentiated neuroendocrine tumors: Secondary | ICD-10-CM

## 2016-10-20 MED ORDER — DEXAMETHASONE SODIUM PHOSPHATE 10 MG/ML IJ SOLN
10.0000 mg | Freq: Once | INTRAMUSCULAR | Status: AC
  Administered 2016-10-20: 10 mg via INTRAVENOUS
  Filled 2016-10-20: qty 1

## 2016-10-20 MED ORDER — SODIUM CHLORIDE 0.9 % IV SOLN
Freq: Once | INTRAVENOUS | Status: AC
  Administered 2016-10-20: 14:00:00 via INTRAVENOUS
  Filled 2016-10-20: qty 1000

## 2016-10-20 MED ORDER — SODIUM CHLORIDE 0.9 % IV SOLN
100.0000 mg/m2 | Freq: Once | INTRAVENOUS | Status: AC
  Administered 2016-10-20: 180 mg via INTRAVENOUS
  Filled 2016-10-20: qty 9

## 2016-10-20 NOTE — Telephone Encounter (Signed)
I did talk to pharmacist who is also her daughter and she states that it is a new thing that has been out for a few weeks and it is rec: that anyone that gets more than 30 mg morphine in a day that they get narcan nasal spray. She states that it can be in future that the pharmacist has the right to send the rx and give to pt if they meet the criteria of how much morphine rx that the guidelines state.   She also told me that the pt's mom died of pancreatic cancer and she had a stroke. She is concerned about her mom because she bit her tongue about 1-2 weeks ago and he speech was a little off esp. She did not pronounce her s's right.  They though it was from fentanyl patch and she came off of it but the speech with s's did not improve and she just wants to check about CVA-I did tell her that I would check with Dr. Janese Banks and let the husband know. Daughter was fine with this.

## 2016-10-22 ENCOUNTER — Telehealth: Payer: Self-pay | Admitting: *Deleted

## 2016-10-22 ENCOUNTER — Other Ambulatory Visit: Payer: Self-pay | Admitting: Oncology

## 2016-10-22 DIAGNOSIS — G893 Neoplasm related pain (acute) (chronic): Secondary | ICD-10-CM

## 2016-10-22 DIAGNOSIS — R112 Nausea with vomiting, unspecified: Secondary | ICD-10-CM

## 2016-10-22 DIAGNOSIS — C7A1 Malignant poorly differentiated neuroendocrine tumors: Secondary | ICD-10-CM

## 2016-10-22 NOTE — Progress Notes (Signed)
  Oncology Nurse Navigator Documentation At request of patient, and with permission from Dr. Janese Banks, referral was sent to Dr. Leslie Andrea at [facility]. Appointment has been scheduled for 11/16/16 at 0800. Records sent and [facility] will request imaging Cd and pathology slides. I have notified Sharon Hester regarding appointment date/time and provided him the direct GI referral line for Dr. Leamon Arnt if the appointment is conflicting with other appointments. Navigator Location: [facility] (10/22/16 1100)   )Navigator Encounter Type: Letter/Fax/Email (10/22/16 1100)                         Barriers/Navigation Needs: Coordination of Care (10/22/16 1100)   Interventions: Referrals;Coordination of Care (10/22/16 1100)                      Time Spent with Patient: 45 (10/22/16 1100)

## 2016-10-22 NOTE — Telephone Encounter (Signed)
[facility]ed [facility] to find out when the results would be back and I was told that they rcvd the actual specimen for testing on 10/16/16 and it will be resulted out 1/22.  Gave this info to Dr. Janese Banks

## 2016-10-22 NOTE — Telephone Encounter (Signed)
I called pt and let her know that I got a message that pt needed refill of decadron and I spoke to dr. Janese Banks and she said it is ok to refill and that I sent it in electronically.

## 2016-10-25 ENCOUNTER — Other Ambulatory Visit: Payer: Self-pay | Admitting: *Deleted

## 2016-10-25 DIAGNOSIS — C7A1 Malignant poorly differentiated neuroendocrine tumors: Secondary | ICD-10-CM

## 2016-10-25 NOTE — Progress Notes (Signed)
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 He has a history of villous adenoma of the rectum in 2005 status post excision. Subsequent colonoscopies have been negative for malignancy. Colonoscopy on 05/12/2015 which showed sessile polyps in the sigmoid colon and hepatic flexure along with grade 1 internal hemorrhoids.   2. She had been having some right-sided abdominal pain and was seen in urgent carein December 2055fr what was thought to be pain from her kidney stones. She was seen by PCP and urology and the kidney stones were thought to be small and nonobstructing and did not feel that this is the cause for abdominal pain   3. She underwent ultrasound of the abdomen for further evaluation on 09/23/2016 which showed a solid mass in the right lobe of the liver measuring 4.8 x 5 x 5 cm. This was followed by a CT abdomen the same day which showed a hypoattenuating/hypoenhancing liver lesions which were new since 2010. Index posterior right hepatic lobe lesion was 4.9 x 4.7 cm,lesion straddling the anterior right and medial left hepatic lobe measures 3.5 x 5.2 cm. Weight left-sided lesions up to 1.2 cm. Suspicion of asymmetric wall thickening in the posterior left side of the rectum. Borderline to mild left inguinal adenopathy of 1 cm and perirectal adenopathy up to 1.8 cm suspicious presacral node measuring 8 mm. New multifocal nauseous lesions including  within the inferior aspect of the L3 vertebral body. Bibasilar pulmonary nodules suspicious for malignancy  4. Patient had a repeat colonoscopy on 09/27/2016 which showed many polyps in the rectum and the rectosigmoid colon which were biopsied. Internal hemorrhoids. Biopsy showed hyperplastic polyps negative for dysplasia and malignancy  5. PET CT scan showed widespread metastatic disease with hypermetabolism in the mediastinal and left hilar lymph nodes. Multifocal pulmonary nodules compatible with thoracic metastases. Extensive liver metastases as well as upper abdominal and pelvic lymph node metastases. Widespread nauseous metastatic disease and at least one pathologic fracture involving the L2 vertebra.  6. Ultrasound-guided biopsy of the liver showed high-grade neuroendocrine carcinoma with Ki-67 of 80%. CD 56 positive and synatophysin diffuse moderate staining.  7. Patient received cycle 1 of chemotherapy carboplatin/etoposide from 10/18/2016 to 10/20/2016  Interval history- patient reports that her pain is well controlled with steroids and she is barely used any morphine. She mainly has pain when she tries to get up and move around. Nausea is well controlled with Zofran. She continues to have occasional problems with slurring of her speech mainly when she pronounces the letter S. Denies any headaches. Family has not noticed any personality changes  ECOG PS- 1 Pain scale- 3- hip pain Opioid associated constipation- no  Review of systems- Review of Systems  Constitutional: Positive for malaise/fatigue. Negative for chills, fever and weight loss.  HENT: Negative for congestion, ear discharge and nosebleeds.   Eyes: Negative for blurred vision.  Respiratory: Negative for cough, hemoptysis, sputum production, shortness of breath and wheezing.   Cardiovascular: Negative for chest pain, palpitations, orthopnea and  claudication.  Gastrointestinal: Negative for abdominal pain, blood in stool,  constipation, diarrhea, heartburn, melena, nausea and vomiting.  Genitourinary: Negative for dysuria, flank pain, frequency, hematuria and urgency.  Musculoskeletal: Positive for back pain. Negative for joint pain and myalgias.  Skin: Negative for rash.  Neurological: Negative for dizziness, tingling, focal weakness, seizures, weakness and headaches.  Endo/Heme/Allergies: Does not bruise/bleed easily.  Psychiatric/Behavioral: Negative for depression and suicidal ideas. The patient does not have insomnia.      Current treatment- status post cycle #1 of chemotherapy with carboplatin and etoposide  Allergies  Allergen Reactions  . Codeine Nausea And Vomiting     Past Medical History:  Diagnosis Date  . Allergic rhinitis   . Anxiety   . Asthma   . Attention deficit disorder without mention of hyperactivity   . Calculus of kidney   . Cataract   . Diffuse cystic mastopathy   . High cholesterol   . History of kidney stones   . Hypoglycemia, unspecified   . Migraine, unspecified, without mention of intractable migraine without mention of status migrainosus   . Mixed hyperlipidemia   . Osteopenia   . Precancerous lesion   . Unspecified essential hypertension      Past Surgical History:  Procedure Laterality Date  . ABDOMINAL HYSTERECTOMY     10/2002  . APPENDECTOMY    . COLONOSCOPY  5/06  . COLONOSCOPY WITH PROPOFOL N/A 05/12/2015   Procedure: COLONOSCOPY WITH PROPOFOL;  Surgeon: Manya Silvas, MD;  Location: [facility] ENDOSCOPY;  Service: Endoscopy;  Laterality: N/A;  . COLONOSCOPY WITH PROPOFOL N/A 09/27/2016   Procedure: COLONOSCOPY WITH PROPOFOL;  Surgeon: Manya Silvas, MD;  Location: [facility] ENDOSCOPY;  Service: Endoscopy;  Laterality: N/A;  . EYE SURGERY     multiple; s/p childhood trauma  . LITHOTRIPSY    . 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 TONSILLECTOMY      Social History   Social History  . Marital status: Married    Spouse name: N/A  . Number of  children: 2  . Years of education: N/A   Occupational History  . Retired    Social History Main Topics  . Smoking status: Never Smoker  . Smokeless tobacco: Never Used  . Alcohol use 0.6 oz/week    1 Shots of liquor per week     Comment: occasional last  month  . Drug use: No  . Sexual activity: Not on file   Other Topics Concern  . 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 Diabetes Father   . Colon cancer Father   . Hypertension Father   . Heart attack Father   . Heart attack Mother   . Coronary artery disease Mother   . Pancreatic cancer Mother   . Hyperlipidemia Mother   . Other Brother     Congenital hyperspadias  . Other Brother     loss of kidney function in 1 kidney early in life  . Diabetes      Grandmother  . Coronary artery disease Maternal Grandmother   . Coronary artery disease      Maternal aunts and uncles  . Breast cancer Neg Hx   . Ovarian cancer  Neg Hx   . Uterine cancer Neg Hx      Current Outpatient Prescriptions:  .  amLODipine (NORVASC) 5 MG tablet, Take 1 tablet (5 mg total) by mouth daily., Disp: 90 tablet, Rfl: 3 .  cetirizine (ZYRTEC) 10 MG tablet, Take 10 mg by mouth daily.  , Disp: , Rfl:  .  cholecalciferol (VITAMIN D) 1000 UNITS tablet, Take 1,000 Units by mouth daily.  , Disp: , Rfl:  .  Coenzyme Q10 (COQ10) 400 MG CAPS, Take 1 capsule by mouth daily.  , Disp: , Rfl:  .  dexamethasone (DECADRON) 4 MG tablet, take 1 tablet by mouth twice a day, Disp: 14 tablet, Rfl: 0 .  docusate sodium (COLACE) 100 MG capsule, Take 100 mg by mouth daily., Disp: , Rfl:  .  estradiol (VIVELLE-DOT) 0.1 MG/24HR patch, Place 1 patch (0.1 mg total) onto the skin 2 (two) times a week., Disp: 24 patch, Rfl: 3 .  fentaNYL (DURAGESIC - DOSED MCG/HR) 25 MCG/HR patch, Place 25 mcg onto  the skin every 3 (three) days., Disp: , Rfl: 0 .  Flaxseed, Linseed, (FLAX SEED OIL) 1000 MG CAPS, Take 1 capsule by mouth daily., Disp: , Rfl:  .  fluticasone (FLONASE) 50 MCG/ACT nasal spray, Place 2 sprays into both nostrils daily., Disp: 48 g, Rfl: 3 .  HYDROcodone-acetaminophen (NORCO/VICODIN) 5-325 MG tablet, Take 1-2 tablets by mouth every 6 (six) hours as needed. (Patient not taking: Reported on 10/14/2016), Disp: 40 tablet, Rfl: 0 .  morphine (MS CONTIN) 15 MG 12 hr tablet, Take 1 tablet (15 mg total) by mouth every 12 (twelve) hours., Disp: 60 tablet, Rfl: 0 .  morphine (MSIR) 15 MG tablet, Take 1 tablet (15 mg total) by mouth every 4 (four) hours as needed for severe pain., Disp: 120 tablet, Rfl: 0 .  Multiple Vitamin (MULTIVITAMIN) tablet, Take 1 tablet by mouth daily.  , Disp: , Rfl:  .  nitrofurantoin, macrocrystal-monohydrate, (MACROBID) 100 MG capsule, Take 1 capsule (100 mg total) by mouth 2 (two) times daily. (Patient not taking: Reported on 10/14/2016), Disp: 14 capsule, Rfl: 0 .  Omega-3 Fatty Acids (FISH OIL) 1200 MG CAPS, Take 1 capsule by mouth 2 (two) times daily. , Disp: , Rfl:  .  ondansetron (ZOFRAN) 4 MG tablet, Take 1 tablet (4 mg total) by mouth every 6 (six) hours as needed for nausea or vomiting., Disp: 30 tablet, Rfl: 0 .  ondansetron (ZOFRAN-ODT) 4 MG disintegrating tablet, Take by mouth., Disp: , Rfl:  .  Oxycodone HCl 10 MG TABS, take 1 tablet by mouth twice a day if needed for pain, Disp: , Rfl: 0 .  Probiotic Product (PROBIOTIC DAILY PO), Take 1 capsule by mouth daily., Disp: , Rfl:  .  prochlorperazine (COMPAZINE) 10 MG tablet, Take 1 tablet (10 mg total) by mouth every 6 (six) hours as needed for nausea or vomiting., Disp: 30 tablet, Rfl: 0 .  rosuvastatin (CRESTOR) 20 MG tablet, Take one and one half tablets by mouth once daily for cholesterol, Disp: 135 tablet, Rfl: 3 .  senna-docusate (SENOKOT-S) 8.6-50 MG tablet, Take 1 tablet by mouth daily., Disp: , Rfl:  .   travoprost, benzalkonium, (TRAVATAN) 0.004 % ophthalmic solution, Place 1 drop into the right eye at bedtime., Disp: , Rfl:  .  venlafaxine XR (EFFEXOR-XR) 75 MG 24 hr capsule, take 1 capsule by mouth once daily, Disp: 90 capsule, Rfl: 3  Physical exam: There were no vitals filed for this visit. Physical Exam  Constitutional: She is oriented to person, place, and time and well-developed, well-nourished, and in no distress.  She is ambulating with a cane  HENT:  Head: Normocephalic and atraumatic.  Eyes: EOM are normal. Pupils are equal, round, and reactive to light.  Neck: Normal range of motion.  Cardiovascular: Normal rate, regular rhythm and normal heart sounds.   Pulmonary/Chest: Effort normal and breath sounds normal.  Abdominal: Soft. Bowel sounds are normal.  Palpable mass in the right upper quadrant  Neurological: She is alert and oriented to person, place, and time.  Skin: Skin is warm and dry.     CMP Latest Ref Rng & Units 10/26/2016  Glucose 65 - 99 mg/dL 147(H)  BUN 6 - 20 mg/dL 47(H)  Creatinine 0.44 - 1.00 mg/dL 1.03(H)  Sodium 135 - 145 mmol/L 135  Potassium 3.5 - 5.1 mmol/L 4.5  Chloride 101 - 111 mmol/L 99(L)  CO2 22 - 32 mmol/L 25  Calcium 8.9 - 10.3 mg/dL 11.2(H)  Total Protein 6.5 - 8.1 g/dL 7.3  Total Bilirubin 0.3 - 1.2 mg/dL 1.0  Alkaline Phos 38 - 126 U/L 240(H)  AST 15 - 41 U/L 34  ALT 14 - 54 U/L 46   CBC Latest Ref Rng & Units 10/26/2016  WBC 3.6 - 11.0 K/uL 9.0  Hemoglobin 12.0 - 16.0 g/dL 15.7  Hematocrit 35.0 - 47.0 % 46.8  Platelets 150 - 440 K/uL 134(L)    No images are attached to the encounter.  Mr Jeri Cos Wo Contrast  Result Date: 10/15/2016 CLINICAL DATA:  Carcinoma of unknown primary.  Staging. EXAM: MRI HEAD WITHOUT AND WITH CONTRAST TECHNIQUE: Multiplanar, multiecho pulse sequences of the brain and surrounding structures were obtained without and with intravenous contrast. CONTRAST:  27m MULTIHANCE GADOBENATE DIMEGLUMINE 529 MG/ML IV  SOLN COMPARISON:  10/01/2016 FINDINGS: Brain: No evidence of metastatic disease to the brain or leptomeninges. 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 No acute or subacute infarction. Mild chronic small-vessel change of the hemispheric deep white matter. Insignificant small venous angioma adjacent to the occipital horn of the right lateral ventricle. No hydrocephalus. No extra-axial fluid collection. Vascular: Major vessels at the base of the brain show flow. Skull and upper cervical spine: Enhancing focus in the parieto-occipital calvarium measuring about 1.5 cm could represent a bone metastasis. Upper cervical region appears negative. Sinuses/Orbits: Sinuses are clear. Chronic abnormal left globe with prosthesis. Right orbit appears negative. Other: None IMPRESSION: No evidence of metastatic disease to the brain itself. 1.5 cm region of abnormal signal within the calvarium in the occipital region could represent a metastasis. The differential diagnosis does include benign entities such as hemangioma. Encephalomalacia in the right posterior parietal region which could be due to an old stroke or old trauma. Electronically Signed   By: MNelson ChimesM.D.   On: 10/15/2016 09:28   Nm Pet Image Initial (pi) Skull Base To Thigh  Result Date: 10/01/2016 CLINICAL DATA:  Initial treatment strategy for rectal carcinoma. EXAM: NUCLEAR MEDICINE PET SKULL BASE TO THIGH TECHNIQUE: 11.96 mCi F-18 FDG was injected intravenously. Full-ring PET imaging was performed from the skull base to thigh after the radiotracer. CT data was obtained and used for attenuation correction and anatomic localization. FASTING BLOOD GLUCOSE:  Value: 104 mg/dl COMPARISON:  09/23/2016 FINDINGS: NECK No hypermetabolic lymph nodes in the neck. CHEST Hypermetabolic AP window lymph node measures 9 mm and has an SUV max equal to 5.6. FDG uptake associated with  hypermetabolic left hilar node is equal to  5.9. Small pulmonary nodules are identified in both lungs compatible with metastatic disease. Index nodule in the left upper lobe measures 11 mm and has an SUV max equal to 3.19. Other nodules are bili metric and too small to reliably characterize by PET-CT. ABDOMEN/PELVIS Multifocal hypermetabolic liver metastases identified. Posterior right lobe of liver lesion measures 5.7 cm and has an SUV max equal to 20.01. Inferior right lobe of liver lesion measures 3.9 cm and has an SUV max of 14.18. Index lesion within the lateral segment of left lobe of liver measures 1.2 cm and has an SUV max equal to 9.79. Multifocal hypermetabolic upper abdominal lymph nodes identified. Index portacaval lymph node measures 1.5 cm and has an SUV max equal to 9.1. There are several enlarged very of rectal lymph nodes. The largest is identified on the left and has a short axis of 1.4 cm and an SUV max of 13.8, image 246 of series 3. Hypermetabolic lymph node within the left inguinal region measures 1.3 cm and has an SUV max equal to 4.1. SKELETON Multifocal hypermetabolic bone metastases are identified. Index lesion in will ball being the left femoral neck has an SUV max equal to 14.7. No corresponding CT abnormality identified. Lytic lesion involving the right iliac bone measures 1.3 cm and has an SUV max equal to 11.1. Lytic lesion within the left side of the L5 vertebra measures 1.7 cm and has an SUV max equal to 13.6. L3 lytic lesion measures 1.8 cm and has an SUV max equal to 10.7. There is a large destructive lesion with associated pathologic fracture involving the L2 vertebra. This measures 3.6 cm and has an SUV max equal to 13.36. Associated pathologic fracture of the inferior endplate with tumor extension into the right ileopsoas muscle is noted. Hypermetabolic metastasis are noted involving bilateral scapula as well as the sternal manubrium. Costovertebral junction hypermetabolic metastasis are identified on the left at T2 and  T3. IMPRESSION: 1. Examination is positive for widespread metastatic disease. There are hypermetabolic mediastinal and left hilar lymph nodes as well as multifocal pulmonary nodules compatible with thoracic metastasis. 2. Extensive liver metastasis as well as upper abdominal and pelvic lymph node metastases. 3. Widespread osseous metastatic disease. Many of these lesions have a lytic appearance on corresponding CT images and there is at least one pathologic fracture which involves the L2 vertebra. Electronically Signed   By: Kerby Moors M.D.   On: 10/01/2016 14:46   US Biopsy  Result Date: 10/07/2016 INDICATION: Concern for metastatic rectal carcinoma. Please perform liver lesion biopsy for tissue diagnostic purposes. EXAM: ULTRASOUND GUIDED LIVER LESION BIOPSY COMPARISON:  PET-CT - 10/01/2016; CT abdomen and pelvis - 09/23/2016 MEDICATIONS: None ANESTHESIA/SEDATION: Fentanyl 75 mcg IV; Versed 2.5 mg IV Total Moderate Sedation time: 20 minutes. The patient's level of consciousness and vital signs were monitored continuously by radiology nursing throughout the procedure under my direct supervision. COMPLICATIONS: None immediate. PROCEDURE: Informed written consent was obtained from the patient after a discussion of the risks, benefits and alternatives to treatment. The patient understands and consents the procedure. A timeout was performed prior to the initiation of the procedure. Ultrasound scanning was performed of the right upper abdominal quadrant demonstrates an approximately 4.2 x 3.3 cm hypoechoic mass within the caudal aspect of the right lobe of the liver which correlates with the hypermetabolic mass seen on preceding PET-CT image 162, series 3). Given lesion location and sonographic window, this mass was targeted  for biopsy. The procedure was planned. The right upper abdominal quadrant was prepped and draped in the usual sterile fashion. The overlying soft tissues were anesthetized with 1% lidocaine  with epinephrine. A 17 gauge, 6.8 cm co-axial needle was advanced into a peripheral aspect of the lesion. This was followed by 4 core biopsies with an 18 gauge core device under direct ultrasound guidance. The coaxial needle tract was embolized with a small amount of Gel-Foam slurry and superficial hemostasis was obtained with manual compression. Post procedural scanning was negative for definitive area of hemorrhage or additional complication. A dressing was placed. The patient tolerated the procedure well without immediate post procedural complication. IMPRESSION: Technically successful ultrasound guided core needle biopsy of hypermetabolic mass within the caudal aspect of the right lobe of the liver. Electronically Signed   By: Sandi Mariscal M.D.   On: 10/07/2016 10:12     Assessment and plan- Patient is a 66 y.o. 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 Patient is tolerated cycle #1 of chemotherapy well. Her BMP does show mild hypercalcemia as well as low chloride. I will give her 1 L of normal saline over 2 hours today. Her hypercalcemia could be secondary to her malignancy and or dehydration.  2. Today I discussed starting Zometa for her bone metastases to reduce the frequency of skeletal related events. I would recommend 4 mg every 4 weeks. I discussed the risks and benefits of Zometa including all but not limited to fatigue, hypocalcemia and a small risk of osteonecrosis of the jaw. She will need dental clearance prior to starting Zometa. This would help her with hypercalcemia as well  Neoplasm-related pain- I have suggested that she should try to come off steroids slowly over the next 2 as long term steroids can have potential complications. And encouraged her to use morphine inserted of steroids for pain. We will also try lidocaine patch to her lower back to see if it would help with her pain.    4. Nausea/vomiting - secondary to underlying malignancy and/or opioid pain medications. Well controlled with when necessary Zofran.   5. Slurring of speech- MRI of her brain did not show any evidence of metastases. However if this worsens with time or if she has any new neurological symptoms; leptomeningeal spread is a possibility and we will consider skull base MRI along with MRI spine and or lumbar puncture for further assessment.  I will see her back in 2 weeks' time prior to cycle #2 of chemotherapy. Hopefully her Foundation one testing results will be back by then. She is scheduled to go to [facility] for second opinion in early February 2018.   Total face to face encounter time for this patient visit was 30 min. >50% of the time was  spent in counseling and coordination of care.      Visit Diagnosis 1. High grade neuroendocrine carcinoma ([place])   2. Neoplasm related pain   3. Bone metastases ([place])   4. Hypercalcemia      Dr. Randa Evens, MD, MPH [facility] Pager- [phone] 10/25/2016 12:52 PM

## 2016-10-26 ENCOUNTER — Inpatient Hospital Stay (HOSPITAL_BASED_OUTPATIENT_CLINIC_OR_DEPARTMENT_OTHER): Payer: Medicare Other | Admitting: Oncology

## 2016-10-26 ENCOUNTER — Encounter: Payer: Self-pay | Admitting: *Deleted

## 2016-10-26 ENCOUNTER — Telehealth: Payer: Self-pay | Admitting: *Deleted

## 2016-10-26 ENCOUNTER — Inpatient Hospital Stay: Payer: Medicare Other

## 2016-10-26 VITALS — BP 106/65 | HR 91 | Temp 98.9°F | Resp 18 | Wt 138.3 lb

## 2016-10-26 DIAGNOSIS — R4781 Slurred speech: Secondary | ICD-10-CM

## 2016-10-26 DIAGNOSIS — C7A026 Malignant carcinoid tumor of the rectum: Secondary | ICD-10-CM

## 2016-10-26 DIAGNOSIS — C7A1 Malignant poorly differentiated neuroendocrine tumors: Secondary | ICD-10-CM

## 2016-10-26 DIAGNOSIS — R5381 Other malaise: Secondary | ICD-10-CM

## 2016-10-26 DIAGNOSIS — N200 Calculus of kidney: Secondary | ICD-10-CM

## 2016-10-26 DIAGNOSIS — Z885 Allergy status to narcotic agent status: Secondary | ICD-10-CM

## 2016-10-26 DIAGNOSIS — G893 Neoplasm related pain (acute) (chronic): Secondary | ICD-10-CM

## 2016-10-26 DIAGNOSIS — K640 First degree hemorrhoids: Secondary | ICD-10-CM

## 2016-10-26 DIAGNOSIS — R109 Unspecified abdominal pain: Secondary | ICD-10-CM

## 2016-10-26 DIAGNOSIS — R112 Nausea with vomiting, unspecified: Secondary | ICD-10-CM

## 2016-10-26 DIAGNOSIS — E782 Mixed hyperlipidemia: Secondary | ICD-10-CM

## 2016-10-26 DIAGNOSIS — C778 Secondary and unspecified malignant neoplasm of lymph nodes of multiple regions: Secondary | ICD-10-CM

## 2016-10-26 DIAGNOSIS — I10 Essential (primary) hypertension: Secondary | ICD-10-CM

## 2016-10-26 DIAGNOSIS — C787 Secondary malignant neoplasm of liver and intrahepatic bile duct: Secondary | ICD-10-CM

## 2016-10-26 DIAGNOSIS — Z800 Family history of malignant neoplasm of digestive organs: Secondary | ICD-10-CM

## 2016-10-26 DIAGNOSIS — C7951 Secondary malignant neoplasm of bone: Secondary | ICD-10-CM

## 2016-10-26 DIAGNOSIS — M8580 Other specified disorders of bone density and structure, unspecified site: Secondary | ICD-10-CM

## 2016-10-26 DIAGNOSIS — Z79899 Other long term (current) drug therapy: Secondary | ICD-10-CM

## 2016-10-26 DIAGNOSIS — Z87442 Personal history of urinary calculi: Secondary | ICD-10-CM

## 2016-10-26 DIAGNOSIS — R5383 Other fatigue: Secondary | ICD-10-CM

## 2016-10-26 DIAGNOSIS — M549 Dorsalgia, unspecified: Secondary | ICD-10-CM

## 2016-10-26 DIAGNOSIS — Z90710 Acquired absence of both cervix and uterus: Secondary | ICD-10-CM

## 2016-10-26 DIAGNOSIS — G9389 Other specified disorders of brain: Secondary | ICD-10-CM

## 2016-10-26 DIAGNOSIS — I700 Atherosclerosis of aorta: Secondary | ICD-10-CM

## 2016-10-26 LAB — CBC WITH DIFFERENTIAL/PLATELET
BASOS ABS: 0.1 10*3/uL (ref 0–0.1)
Basophils Relative: 1 %
EOS PCT: 1 %
Eosinophils Absolute: 0.1 10*3/uL (ref 0–0.7)
HCT: 46.8 % (ref 35.0–47.0)
HEMOGLOBIN: 15.7 g/dL (ref 12.0–16.0)
LYMPHS ABS: 0.8 10*3/uL — AB (ref 1.0–3.6)
LYMPHS PCT: 9 %
MCH: 27 pg (ref 26.0–34.0)
MCHC: 33.5 g/dL (ref 32.0–36.0)
MCV: 80.7 fL (ref 80.0–100.0)
Monocytes Absolute: 0.1 10*3/uL — ABNORMAL LOW (ref 0.2–0.9)
Monocytes Relative: 1 %
NEUTROS PCT: 88 %
Neutro Abs: 8 10*3/uL — ABNORMAL HIGH (ref 1.4–6.5)
PLATELETS: 134 10*3/uL — AB (ref 150–440)
RBC: 5.8 MIL/uL — AB (ref 3.80–5.20)
RDW: 14.8 % — ABNORMAL HIGH (ref 11.5–14.5)
WBC: 9 10*3/uL (ref 3.6–11.0)

## 2016-10-26 LAB — COMPREHENSIVE METABOLIC PANEL
ALK PHOS: 240 U/L — AB (ref 38–126)
ALT: 46 U/L (ref 14–54)
AST: 34 U/L (ref 15–41)
Albumin: 3.8 g/dL (ref 3.5–5.0)
Anion gap: 11 (ref 5–15)
BUN: 47 mg/dL — AB (ref 6–20)
CALCIUM: 11.2 mg/dL — AB (ref 8.9–10.3)
CHLORIDE: 99 mmol/L — AB (ref 101–111)
CO2: 25 mmol/L (ref 22–32)
CREATININE: 1.03 mg/dL — AB (ref 0.44–1.00)
GFR calc Af Amer: >60 mL/min (ref >60)
GFR, EST NON AFRICAN AMERICAN: 56 mL/min — AB (ref >60)
Glucose, Bld: 147 mg/dL — ABNORMAL HIGH (ref 65–99)
Potassium: 4.5 mmol/L (ref 3.5–5.1)
SODIUM: 135 mmol/L (ref 135–145)
Total Bilirubin: 1 mg/dL (ref 0.3–1.2)
Total Protein: 7.3 g/dL (ref 6.5–8.1)

## 2016-10-26 MED ORDER — LIDOCAINE 5 % EX PTCH: 1.0000 | MEDICATED_PATCH | CUTANEOUS | 0 refills | Status: AC

## 2016-10-26 MED ORDER — SODIUM CHLORIDE 0.9 % IV SOLN
INTRAVENOUS | Status: AC
  Administered 2016-10-26: 10:00:00 via INTRAVENOUS
  Filled 2016-10-26: qty 1000

## 2016-10-26 NOTE — Progress Notes (Signed)
Patient is here for follow up, she is doing ok. She has a question about the Decadron is she to take it once daily or twice daily. She also has slurry speech. She bit her tongue three weeks ago and has two knots where she bit it

## 2016-10-26 NOTE — Telephone Encounter (Signed)
Called [phone] and went through the questions for approval and it went to pharmacy review. I asked for it to be urgent and we will have response in 24 hours.  I have called the pt's house and left a message about the the medication has went to review.

## 2016-10-27 ENCOUNTER — Encounter: Payer: Self-pay | Admitting: *Deleted

## 2016-10-27 ENCOUNTER — Telehealth: Payer: Self-pay | Admitting: *Deleted

## 2016-10-27 NOTE — Telephone Encounter (Signed)
I called pt's home and spoke to ron and let him know that I did get denial of lidocaine patch and I have spoke to several people at insurance to do appeal for the medication.  I have faxed it to [phone] to have urgent appeal. I did tell ron that the cancer center has been closed for tom. But I will look for an appeal decision on Friday when I come in.

## 2016-11-01 ENCOUNTER — Telehealth: Payer: Self-pay | Admitting: *Deleted

## 2016-11-01 ENCOUNTER — Other Ambulatory Visit (INDEPENDENT_AMBULATORY_CARE_PROVIDER_SITE_OTHER): Payer: Self-pay | Admitting: Vascular Surgery

## 2016-11-01 NOTE — Telephone Encounter (Signed)
Lorry at Dr. Andreas Blower office called wanting to know if it is safe to prescribe biophosphanates. Please call her at [phone]

## 2016-11-01 NOTE — Telephone Encounter (Signed)
Called Dr. Marcelline Mates office and told them that we would like to start pt on zometa but need clearance from dentist to start.  Pt is already on chemotherapy and we would like to add this med on but it does have rare side effect ONJ.  They are seeing pt tom. And doing xray of teeth.  If they did see a tooth that needs filling or some work done to it can they do it tom. I said that it is rec: that they call our office because pt is on chemo and we would need to confirm that pt counts are good before proceeding with cleaning and dental work.  They agree they will call if any issues.

## 2016-11-01 NOTE — Telephone Encounter (Signed)
I had spoke to Sharon Hester about her lidocain patch had been approved and she had mentioned that she has been taking the steroid every other day, breaking away from the steroids and she feels bad the day she does not take it-no energy, not as active, does not eat as much.she knows there are long term side effects but would really like to know again to think about it because the steroids really make her be able to tolerate more.  I spoke with Sharon Hester and she said once a day steroid until she comes back 1/30 and hopefully after 2 cycles maybe she will feel better.  I called and spoke to husband and he was telling pt on the phone that she can take 1 steroid pill a day. Also she states that the pain patch has helped last 2 days for back pain.

## 2016-11-02 ENCOUNTER — Ambulatory Visit: Payer: Medicare Other

## 2016-11-02 ENCOUNTER — Other Ambulatory Visit: Payer: Medicare Other

## 2016-11-02 ENCOUNTER — Other Ambulatory Visit: Payer: Self-pay | Admitting: Oncology

## 2016-11-02 ENCOUNTER — Telehealth: Payer: Self-pay | Admitting: *Deleted

## 2016-11-02 DIAGNOSIS — C7951 Secondary malignant neoplasm of bone: Secondary | ICD-10-CM | POA: Insufficient documentation

## 2016-11-02 DIAGNOSIS — C7A1 Malignant poorly differentiated neuroendocrine tumors: Secondary | ICD-10-CM

## 2016-11-02 NOTE — Telephone Encounter (Signed)
I called and spoke to Ron earlier today to ask if it would be ok to get cbc for pt tom. To make sure counts are good for treatment and he was agreeable. I told them that we got a note from Dr.Cherry that pt's teeth were fine so we will start zometa next week when she comes for chemo.  Mr. Marina wants her to come tom and get labs but would like pt to start zometa tom. I checked with Dr. Janese Banks and she said it is ok. I checked with Brandi in insurance and she said it is ok.  I got her added to sch. For 1:15 for labs and 1:45 for Zometa.  I called back to the house and spoke to pt and she is agreeable to the plan. I did tell her we are checking her counts for the port but to have zometa we have to check kidney function and she is ok with that so I told her once her labs are checked it will probably take 30 min to get labs back and the zometa takes about 15-20 min to infuse and she is agreeable to the above.

## 2016-11-03 ENCOUNTER — Inpatient Hospital Stay: Payer: Medicare Other

## 2016-11-03 VITALS — BP 123/79 | HR 82 | Temp 99.5°F | Resp 20

## 2016-11-03 DIAGNOSIS — C7A1 Malignant poorly differentiated neuroendocrine tumors: Secondary | ICD-10-CM

## 2016-11-03 DIAGNOSIS — C7951 Secondary malignant neoplasm of bone: Secondary | ICD-10-CM

## 2016-11-03 DIAGNOSIS — C7A026 Malignant carcinoid tumor of the rectum: Secondary | ICD-10-CM | POA: Diagnosis not present

## 2016-11-03 LAB — CBC WITH DIFFERENTIAL/PLATELET
BASOS ABS: 0 10*3/uL (ref 0–0.1)
BASOS PCT: 0 %
EOS ABS: 0 10*3/uL (ref 0–0.7)
EOS PCT: 1 %
HCT: 41.3 % (ref 35.0–47.0)
Hemoglobin: 14 g/dL (ref 12.0–16.0)
Lymphocytes Relative: 32 %
Lymphs Abs: 1 10*3/uL (ref 1.0–3.6)
MCH: 27.2 pg (ref 26.0–34.0)
MCHC: 33.9 g/dL (ref 32.0–36.0)
MCV: 80.3 fL (ref 80.0–100.0)
Monocytes Absolute: 0.6 10*3/uL (ref 0.2–0.9)
Monocytes Relative: 19 %
Neutro Abs: 1.5 10*3/uL (ref 1.4–6.5)
Neutrophils Relative %: 48 %
PLATELETS: 222 10*3/uL (ref 150–440)
RBC: 5.14 MIL/uL (ref 3.80–5.20)
RDW: 15.2 % — ABNORMAL HIGH (ref 11.5–14.5)
WBC: 3.2 10*3/uL — AB (ref 3.6–11.0)

## 2016-11-03 LAB — BASIC METABOLIC PANEL
ANION GAP: 8 (ref 5–15)
BUN: 34 mg/dL — ABNORMAL HIGH (ref 6–20)
CO2: 25 mmol/L (ref 22–32)
Calcium: 9.8 mg/dL (ref 8.9–10.3)
Chloride: 103 mmol/L (ref 101–111)
Creatinine, Ser: 1.21 mg/dL — ABNORMAL HIGH (ref 0.44–1.00)
GFR, EST AFRICAN AMERICAN: 53 mL/min — AB (ref >60)
GFR, EST NON AFRICAN AMERICAN: 46 mL/min — AB (ref >60)
Glucose, Bld: 216 mg/dL — ABNORMAL HIGH (ref 65–99)
POTASSIUM: 4.9 mmol/L (ref 3.5–5.1)
SODIUM: 136 mmol/L (ref 135–145)

## 2016-11-03 MED ORDER — SODIUM CHLORIDE 0.9 % IR SOLN
Freq: Once | Status: AC
  Administered 2016-11-04: 09:00:00
  Filled 2016-11-03 (×2): qty 2

## 2016-11-03 MED ORDER — SODIUM CHLORIDE 0.9 % IV SOLN
Freq: Once | INTRAVENOUS | Status: AC
  Administered 2016-11-03: 14:00:00 via INTRAVENOUS
  Filled 2016-11-03: qty 1000

## 2016-11-03 MED ORDER — CEFAZOLIN IN D5W 1 GM/50ML IV SOLN
1.0000 g | Freq: Once | INTRAVENOUS | Status: AC
  Administered 2016-11-04: 1 g via INTRAVENOUS

## 2016-11-03 MED ORDER — ZOLEDRONIC ACID 4 MG/5ML IV CONC
3.3000 mg | Freq: Once | INTRAVENOUS | Status: AC
  Administered 2016-11-03: 3.3 mg via INTRAVENOUS
  Filled 2016-11-03: qty 4.13

## 2016-11-03 MED ORDER — LIDOCAINE-PRILOCAINE 2.5-2.5 % EX CREA: 1.0000 "application " | TOPICAL_CREAM | CUTANEOUS | 2 refills | Status: AC | PRN

## 2016-11-04 ENCOUNTER — Encounter
Admission: RE | Disposition: A | Discharge status: Home / Self Care | Payer: Self-pay | Source: Ambulatory Visit | Attending: Vascular Surgery

## 2016-11-04 ENCOUNTER — Ambulatory Visit
Admission: RE | Admit: 2016-11-04 | Discharge: 2016-11-04 | Disposition: A | Discharge status: Home / Self Care | Payer: Medicare Other | Source: Ambulatory Visit | Attending: Vascular Surgery | Admitting: Vascular Surgery

## 2016-11-04 ENCOUNTER — Other Ambulatory Visit: Payer: Self-pay | Admitting: *Deleted

## 2016-11-04 ENCOUNTER — Ambulatory Visit: Payer: Medicare Other

## 2016-11-04 DIAGNOSIS — G43909 Migraine, unspecified, not intractable, without status migrainosus: Secondary | ICD-10-CM | POA: Diagnosis not present

## 2016-11-04 DIAGNOSIS — Z8249 Family history of ischemic heart disease and other diseases of the circulatory system: Secondary | ICD-10-CM | POA: Diagnosis not present

## 2016-11-04 DIAGNOSIS — E7800 Pure hypercholesterolemia, unspecified: Secondary | ICD-10-CM | POA: Diagnosis not present

## 2016-11-04 DIAGNOSIS — Z87442 Personal history of urinary calculi: Secondary | ICD-10-CM | POA: Insufficient documentation

## 2016-11-04 DIAGNOSIS — M8588 Other specified disorders of bone density and structure, other site: Secondary | ICD-10-CM | POA: Insufficient documentation

## 2016-11-04 DIAGNOSIS — Z800 Family history of malignant neoplasm of digestive organs: Secondary | ICD-10-CM | POA: Diagnosis not present

## 2016-11-04 DIAGNOSIS — F419 Anxiety disorder, unspecified: Secondary | ICD-10-CM | POA: Diagnosis not present

## 2016-11-04 DIAGNOSIS — Z90710 Acquired absence of both cervix and uterus: Secondary | ICD-10-CM | POA: Insufficient documentation

## 2016-11-04 DIAGNOSIS — Z7902 Long term (current) use of antithrombotics/antiplatelets: Secondary | ICD-10-CM | POA: Insufficient documentation

## 2016-11-04 DIAGNOSIS — Z9221 Personal history of antineoplastic chemotherapy: Secondary | ICD-10-CM | POA: Insufficient documentation

## 2016-11-04 DIAGNOSIS — Z833 Family history of diabetes mellitus: Secondary | ICD-10-CM | POA: Diagnosis not present

## 2016-11-04 DIAGNOSIS — Z808 Family history of malignant neoplasm of other organs or systems: Secondary | ICD-10-CM | POA: Diagnosis not present

## 2016-11-04 DIAGNOSIS — C7A8 Other malignant neuroendocrine tumors: Secondary | ICD-10-CM | POA: Insufficient documentation

## 2016-11-04 DIAGNOSIS — F909 Attention-deficit hyperactivity disorder, unspecified type: Secondary | ICD-10-CM | POA: Insufficient documentation

## 2016-11-04 DIAGNOSIS — C7A1 Malignant poorly differentiated neuroendocrine tumors: Secondary | ICD-10-CM | POA: Diagnosis not present

## 2016-11-04 DIAGNOSIS — I10 Essential (primary) hypertension: Secondary | ICD-10-CM | POA: Diagnosis not present

## 2016-11-04 DIAGNOSIS — H269 Unspecified cataract: Secondary | ICD-10-CM | POA: Insufficient documentation

## 2016-11-04 HISTORY — PX: PERIPHERAL VASCULAR CATHETERIZATION: SHX172C

## 2016-11-04 SURGERY — PORTA CATH INSERTION
Anesthesia: Moderate Sedation

## 2016-11-04 MED ORDER — LIDOCAINE-EPINEPHRINE (PF) 2 %-1:200000 IJ SOLN
INTRAMUSCULAR | Status: AC
  Filled 2016-11-04: qty 20

## 2016-11-04 MED ORDER — FENTANYL CITRATE (PF) 100 MCG/2ML IJ SOLN
INTRAMUSCULAR | Status: DC | PRN
  Administered 2016-11-04: 50 ug via INTRAVENOUS

## 2016-11-04 MED ORDER — MIDAZOLAM HCL 5 MG/5ML IJ SOLN
INTRAMUSCULAR | Status: AC
  Filled 2016-11-04: qty 5

## 2016-11-04 MED ORDER — FENTANYL CITRATE (PF) 100 MCG/2ML IJ SOLN
INTRAMUSCULAR | Status: AC
  Filled 2016-11-04: qty 2

## 2016-11-04 MED ORDER — SODIUM CHLORIDE 0.9 % IV SOLN
INTRAVENOUS | Status: DC
  Administered 2016-11-04: 07:00:00 via INTRAVENOUS

## 2016-11-04 MED ORDER — MIDAZOLAM HCL 2 MG/2ML IJ SOLN
INTRAMUSCULAR | Status: DC | PRN
  Administered 2016-11-04: 2 mg via INTRAVENOUS

## 2016-11-04 MED ORDER — HEPARIN (PORCINE) IN NACL 2-0.9 UNIT/ML-% IJ SOLN
INTRAMUSCULAR | Status: AC
  Filled 2016-11-04: qty 500

## 2016-11-04 MED ORDER — HEPARIN SODIUM (PORCINE) 1000 UNIT/ML IJ SOLN
INTRAMUSCULAR | Status: AC
  Filled 2016-11-04: qty 1

## 2016-11-04 SURGICAL SUPPLY — 10 items
BAG DECANTER STRL (MISCELLANEOUS) ×3 IMPLANT
KIT PORT POWER 8FR ISP CVUE (Catheter) ×2 IMPLANT
PACK ANGIOGRAPHY (CUSTOM PROCEDURE TRAY) ×3 IMPLANT
PAD GROUND ADULT SPLIT (MISCELLANEOUS) ×3 IMPLANT
PENCIL ELECTRO HAND CTR (MISCELLANEOUS) ×3 IMPLANT
PREP CHG 10.5 TEAL (MISCELLANEOUS) ×3 IMPLANT
SUT MNCRL AB 4-0 PS2 18 (SUTURE) ×3 IMPLANT
SUT PROLENE 0 CT 1 30 (SUTURE) ×3 IMPLANT
SUTURE VIC 3-0 (SUTURE) ×3 IMPLANT
TOWEL OR 17X26 4PK STRL BLUE (TOWEL DISPOSABLE) ×3 IMPLANT

## 2016-11-04 NOTE — H&P (Signed)
[facility] History & Physical Update  The patient was interviewed and re-examined.  The patient's previous History and Physical has been reviewed and is unchanged.  There is no change in the plan of care. We plan to proceed with the scheduled procedure.  Leotis Pain, MD  11/04/2016, 8:08 AM

## 2016-11-04 NOTE — Op Note (Signed)
      [facility]       Operative Note  Date: 11/04/2016  Preoperative diagnosis:  1. Metastatic neuroendocrine carcinoma  Postoperative diagnosis:  Same as above  Procedures: #1. Ultrasound guidance for vascular access to the right internal jugular vein. #2. Fluoroscopic guidance for placement of catheter. #3. Placement of CT compatible Port-A-Cath, right internal jugular vein.  Surgeon: Leotis Pain, MD.   Anesthesia: Local with moderate conscious sedation for approximately 25 minutes using 2 mg of Versed and 50 mcg of Fentanyl  Fluoroscopy time: less than 1 minute  Contrast used: 0  Estimated blood loss: minimal  Indication for the procedure:  The patient is a 66 y.o.female with metastatic neuroendocrine carcinoma.  The patient needs a Port-A-Cath for durable venous access, chemotherapy, lab draws, and CT scans. We are asked to place this. Risks and benefits were discussed and informed consent was obtained.  Description of procedure: The patient was brought to the vascular and interventional radiology suite.  Moderate conscious sedation was administered throughout the procedure during a face to face encounter with the patient with my supervision of the RN administering medicines and monitoring the patient's vital signs, pulse oximetry, telemetry and mental status throughout from the start of the procedure until the patient was taken to the recovery room. The right neck chest and shoulder were sterilely prepped and draped, and a sterile surgical field was created. Ultrasound was used to help visualize a patent right internal jugular vein. This was then accessed under direct ultrasound guidance without difficulty with the Seldinger needle and a permanent image was recorded. A J-wire was placed. After skin nick and dilatation, the peel-away sheath was then placed over the wire. I then anesthetized an area under the clavicle approximately 1-2 fingerbreadths. A transverse  incision was created and an inferior pocket was created with electrocautery and blunt dissection. The port was then brought onto the field, placed into the pocket and secured to the chest wall with 2 Prolene sutures. The catheter was connected to the port and tunneled from the subclavicular incision to the access site. Fluoroscopic guidance was then used to cut the catheter to an appropriate length. The catheter was then placed through the peel-away sheath and the peel-away sheath was removed. The catheter tip was parked in excellent location under fluorocoscopic guidance in the cavoatrial junction. The pocket was then irrigated with antibiotic impregnated saline and the wound was closed with a running 3-0 Vicryl and a 4-0 Monocryl. The access incision was closed with a single 4-0 Monocryl. The Huber needle was used to withdraw blood and flush the port with heparinized saline. Dermabond was then placed as a dressing. The patient tolerated the procedure well and was taken to the recovery room in stable condition.   Leotis Pain 11/04/2016 8:56 AM   This note was created with Dragon Medical transcription system. Any errors in dictation are purely unintentional.

## 2016-11-05 ENCOUNTER — Encounter: Payer: Self-pay | Admitting: Vascular Surgery

## 2016-11-08 NOTE — Progress Notes (Signed)
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 patient is a 66 year old female follows up with [facility]. He has a history of villous adenoma of the rectum in 2005 status post excision. Subsequent colonoscopies have been negative for malignancy. Colonoscopy on 05/12/2015 which showed sessile polyps in the sigmoid colon and hepatic flexure along with grade 1 internal hemorrhoids.   2. She had been having some right-sided abdominal pain and was seen in urgent carein December 2085fr what was thought to be pain from her kidney stones. She was seen by PCP and urology and the kidney stones were thought to be small and nonobstructing and did not feel that this is the cause for abdominal pain   3. She underwent ultrasound of the abdomen for further evaluation on 09/23/2016 which showed a solid mass in the right lobe of the liver measuring 4.8 x 5 x 5 cm. This was followed by a CT abdomen the same day which showed a hypoattenuating/hypoenhancing liver lesions which were new since 2010. Index posterior right hepatic lobe lesion was 4.9 x 4.7 cm,lesion straddling the anterior right and medial left hepatic lobe measures 3.5 x 5.2 cm. Weight left-sided lesions up to 1.2 cm. Suspicion of asymmetric wall thickening in the posterior left side of the rectum. Borderline to mild left inguinal adenopathy of 1 cm and perirectal adenopathy up to 1.8 cm suspicious presacral node measuring 8 mm. New multifocal nauseous lesions  including within the inferior aspect of the L3 vertebral body. Bibasilar pulmonary nodules suspicious for malignancy  4. Patient had a repeat colonoscopy on 09/27/2016 which showed many polyps in the rectum and the rectosigmoid colon which were biopsied. Internal hemorrhoids. Biopsy showed hyperplastic polyps negative for dysplasia and malignancy  5. PET CT scan showed widespread metastatic disease with hypermetabolism in the mediastinal and left hilar lymph nodes. Multifocal pulmonary nodules compatible with thoracic metastases. Extensive liver metastases as well as upper abdominal and pelvic lymph node metastases. Widespread nauseous metastatic disease and at least one pathologic fracture involving the L2 vertebra.  6. Ultrasound-guided biopsy of the liver showed high-grade neuroendocrine carcinoma with Ki-67 of 80%. CD 56 positive and synatophysindiffuse moderate staining.  7. Patient received cycle 1 of chemotherapy carboplatin/etoposide from 10/18/2016 to 10/20/2016  8. Patient received 1st dose of zometa on 11/03/16  9. Foundation one testing was done which showed: PDL1 expression was 0%. 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 MSI stable.    Interval history- reports feeling a lot better. Energy levels are better. Pain is better controlled. Nausea is barely there. She is able to ambulate without much difficulty. Constipation controlled with prune juice  ECOG PS- 1 Pain scale- 2 Opioid associated constipation- no  Review of systems- Review of Systems  Constitutional: Negative for chills, fever, malaise/fatigue and weight loss.  HENT: Negative for congestion, ear discharge and nosebleeds.   Eyes: Negative for blurred vision.  Respiratory: Negative for cough, hemoptysis, sputum production, shortness of breath and wheezing.   Cardiovascular: Negative for chest pain, palpitations, orthopnea and claudication.  Gastrointestinal:  Negative for abdominal pain, blood in stool, constipation,  diarrhea, heartburn, melena, nausea and vomiting.  Genitourinary: Negative for dysuria, flank pain, frequency, hematuria and urgency.  Musculoskeletal: Negative for back pain, joint pain and myalgias.  Skin: Negative for rash.  Neurological: Negative for dizziness, tingling, focal weakness, seizures, weakness and headaches.  Endo/Heme/Allergies: Does not bruise/bleed easily.  Psychiatric/Behavioral: Negative for depression and suicidal ideas. The patient does not have insomnia.      Current treatment- s/p 1 cycle of chemotherapy with carboplatin and etoposide  Allergies  Allergen Reactions  . Codeine Nausea And Vomiting     Past Medical History:  Diagnosis Date  . Allergic rhinitis   . Anxiety   . Asthma   . Attention deficit disorder without mention of hyperactivity   . Calculus of kidney   . Cataract   . Diffuse cystic mastopathy   . High cholesterol   . History of kidney stones   . Hypoglycemia, unspecified   . Migraine, unspecified, without mention of intractable migraine without mention of status migrainosus   . Mixed hyperlipidemia   . Osteopenia   . Precancerous lesion   . Unspecified essential hypertension      Past Surgical History:  Procedure Laterality Date  . ABDOMINAL HYSTERECTOMY     10/2002  . APPENDECTOMY    . COLONOSCOPY  5/06  . COLONOSCOPY WITH PROPOFOL N/A 05/12/2015   Procedure: COLONOSCOPY WITH PROPOFOL;  Surgeon: Manya Silvas, MD;  Location: [facility] ENDOSCOPY;  Service: Endoscopy;  Laterality: N/A;  . COLONOSCOPY WITH PROPOFOL N/A 09/27/2016   Procedure: COLONOSCOPY WITH PROPOFOL;  Surgeon: Manya Silvas, MD;  Location[facility] ENDOSCOPY;  Service: Endoscopy;  Laterality: N/A;  . EYE SURGERY     multiple; s/p childhood trauma  . LITHOTRIPSY    . 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 PERIPHERAL VASCULAR CATHETERIZATION N/A 11/04/2016   Procedure: Glori Luis Cath Insertion;  Surgeon: Algernon Huxley, MD;  Location: [place] [facility] CV LAB;   Service: Cardiovascular;  Laterality: N/A;  . TONSILLECTOMY      Social History   Social History  . Marital status: Married    Spouse name: N/A  . Number of children: 2  . Years of education: N/A   Occupational History  . Retired    Social History Main Topics  . Smoking status: Never Smoker  . Smokeless tobacco: Never Used  . Alcohol use 0.6 oz/week    1 Shots of liquor per week     Comment: occasional last  month  . Drug use: No  . Sexual activity: No   Other Topics Concern  . 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 Diabetes Father   . Colon cancer Father   . Hypertension Father   . Heart attack Father   . Heart attack Mother   . Coronary artery disease Mother   . Pancreatic cancer Mother   . Hyperlipidemia Mother   . Other Brother     Congenital hyperspadias  . Other Brother     loss of kidney function in 1 kidney early in life  . Diabetes      Grandmother  . Coronary artery disease Maternal Grandmother   .  Coronary artery disease      Maternal aunts and uncles  . Breast cancer Neg Hx   . Ovarian cancer Neg Hx   . Uterine cancer Neg Hx      Current Outpatient Prescriptions:  .  amLODipine (NORVASC) 5 MG tablet, Take 1 tablet (5 mg total) by mouth daily., Disp: 90 tablet, Rfl: 3 .  cetirizine (ZYRTEC) 10 MG tablet, Take 10 mg by mouth daily.  , Disp: , Rfl:  .  cholecalciferol (VITAMIN D) 1000 UNITS tablet, Take 1,000 Units by mouth daily.  , Disp: , Rfl:  .  Coenzyme Q10 (COQ10) 400 MG CAPS, Take 400 mg by mouth daily. , Disp: , Rfl:  .  dexamethasone (DECADRON) 4 MG tablet, take 1 tablet by mouth twice a day (Patient taking differently: take 1 tablet by mouth once a day), Disp: 14 tablet, Rfl: 0 .  Flaxseed, Linseed, (FLAX SEED OIL) 1000 MG CAPS, Take 1 capsule by mouth  daily., Disp: , Rfl:  .  fluticasone (FLONASE) 50 MCG/ACT nasal spray, Place 2 sprays into both nostrils daily., Disp: 48 g, Rfl: 3 .  HYDROcodone-acetaminophen (NORCO/VICODIN) 5-325 MG tablet, Take 1-2 tablets by mouth every 6 (six) hours as needed. (Patient taking differently: Take 1-2 tablets by mouth every 6 (six) hours as needed for moderate pain. ), Disp: 40 tablet, Rfl: 0 .  lidocaine (LIDODERM) 5 %, Place 1 patch onto the skin daily. Remove & Discard patch within 12 hours of use each day (Patient taking differently: Place 1 patch onto the skin daily. Remove & Discard patch within 12 hours of use each day as needed for pain), Disp: 30 patch, Rfl: 0 .  lidocaine-prilocaine (EMLA) cream, Apply 1 application topically as needed. Apply to port 2 hours prior to appt, Disp: 30 g, Rfl: 2 .  LUMIGAN 0.01 % SOLN, Place 1 drop into the right eye at bedtime., Disp: , Rfl: 0 .  Multiple Vitamin (MULTIVITAMIN) tablet, Take 1 tablet by mouth daily.  , Disp: , Rfl:  .  naloxone (NARCAN) nasal spray 4 mg/0.1 mL, Place 1 spray into the nose daily as needed (opioid overdose)., Disp: , Rfl:  .  nitrofurantoin, macrocrystal-monohydrate, (MACROBID) 100 MG capsule, Take 1 capsule (100 mg total) by mouth 2 (two) times daily. (Patient taking differently: Take 100 mg by mouth daily as needed (uti prevention). ), Disp: 14 capsule, Rfl: 0 .  Omega-3 Fatty Acids (FISH OIL) 1200 MG CAPS, Take 1,200 mg by mouth daily. , Disp: , Rfl:  .  ondansetron (ZOFRAN-ODT) 4 MG disintegrating tablet, Take 4 mg by mouth every 8 (eight) hours as needed for nausea or vomiting. , Disp: , Rfl:  .  Oxycodone HCl 10 MG TABS, take 68m by mouth twice a day if needed for pain, Disp: , Rfl: 0 .  Probiotic Product (PROBIOTIC DAILY PO), Take 1 capsule by mouth daily., Disp: , Rfl:  .  prochlorperazine (COMPAZINE) 10 MG tablet, Take 1 tablet (10 mg total) by mouth every 6 (six) hours as needed for nausea or vomiting., Disp: 30 tablet, Rfl: 0 .   rosuvastatin (CRESTOR) 20 MG tablet, Take one and one half tablets by mouth once daily for cholesterol (Patient taking differently: Take 30 mg by mouth daily. ), Disp: 135 tablet, Rfl: 3 .  venlafaxine XR (EFFEXOR-XR) 75 MG 24 hr capsule, take 1 capsule by mouth once daily, Disp: 90 capsule, Rfl: 3  Physical exam:  Vitals:   11/09/16 0922  BP: (!) 152/79  Pulse: 80  Resp: 18  Temp: 99.1 F (37.3 C)  TempSrc: Tympanic  Weight: 140 lb 14 oz (63.9 kg)   Physical Exam  Constitutional: She is oriented to person, place, and time and well-developed, well-nourished, and in no distress.  HENT:  Head: Normocephalic and atraumatic.  Eyes: EOM are normal. Pupils are equal, round, and reactive to light.  Neck: Normal range of motion.  Cardiovascular: Normal rate, regular rhythm and normal heart sounds.   Pulmonary/Chest: Effort normal and breath sounds normal.  Abdominal: Soft. Bowel sounds are normal.  ruq liver mass no longer palpable  Neurological: She is alert and oriented to person, place, and time.  Skin: Skin is warm and dry.     CMP Latest Ref Rng & Units 11/03/2016  Glucose 65 - 99 mg/dL 216(H)  BUN 6 - 20 mg/dL 34(H)  Creatinine 0.44 - 1.00 mg/dL 1.21(H)  Sodium 135 - 145 mmol/L 136  Potassium 3.5 - 5.1 mmol/L 4.9  Chloride 101 - 111 mmol/L 103  CO2 22 - 32 mmol/L 25  Calcium 8.9 - 10.3 mg/dL 9.8  Total Protein 6.5 - 8.1 g/dL -  Total Bilirubin 0.3 - 1.2 mg/dL -  Alkaline Phos 38 - 126 U/L -  AST 15 - 41 U/L -  ALT 14 - 54 U/L -   CBC Latest Ref Rng & Units 11/03/2016  WBC 3.6 - 11.0 K/uL 3.2(L)  Hemoglobin 12.0 - 16.0 g/dL 14.0  Hematocrit 35.0 - 47.0 % 41.3  Platelets 150 - 440 K/uL 222    No images are attached to the encounter.  Mr Jeri Cos Wo Contrast  Result Date: 10/15/2016 CLINICAL DATA:  Carcinoma of unknown primary.  Staging. EXAM: MRI HEAD WITHOUT AND WITH CONTRAST TECHNIQUE: Multiplanar, multiecho pulse sequences of the brain and surrounding structures were  obtained without and with intravenous contrast. CONTRAST:  35m MULTIHANCE GADOBENATE DIMEGLUMINE 529 MG/ML IV SOLN COMPARISON:  10/01/2016 FINDINGS: Brain: No evidence of metastatic disease to the brain or leptomeninges. 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 No acute or subacute infarction. Mild chronic small-vessel change of the hemispheric deep white matter. Insignificant small venous angioma adjacent to the occipital horn of the right lateral ventricle. No hydrocephalus. No extra-axial fluid collection. Vascular: Major vessels at the base of the brain show flow. Skull and upper cervical spine: Enhancing focus in the parieto-occipital calvarium measuring about 1.5 cm could represent a bone metastasis. Upper cervical region appears negative. Sinuses/Orbits: Sinuses are clear. Chronic abnormal left globe with prosthesis. Right orbit appears negative. Other: None IMPRESSION: No evidence of metastatic disease to the brain itself. 1.5 cm region of abnormal signal within the calvarium in the occipital region could represent a metastasis. The differential diagnosis does include benign entities such as hemangioma. Encephalomalacia in the right posterior parietal region which could be due to an old stroke or old trauma. Electronically Signed   By: MNelson ChimesM.D.   On: 10/15/2016 09:28     Assessment and plan- Patient is a 66y.o. 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 counts ok to proceed with chemotherapy. I will see her back in 3 weeks time prior to cycle # 3 with cbc, cmp. I will obtain interim scans in 10 days after cycle 2 to assess response.   2. Next dose zometa due on 11/30/16- the same day as cycle #  3 of chemotherapy  3. Discussed results of foundation one testing. Handed over additional copy to patient and her husband. Given that PDL1 expression  on the tumor is 0%, she is likely not going to have a favorable response to immunotherapy in the future. MET inhibitors such as trametinib could be considered down the line as a part of clinical trial. I have encouraged patient to speak to Dr. Lynnette Caffey about this when he sees her at [facility] soon  4. Neoplasm related pain- patient wishes to go back to prn oxycodone. She has enough supplies  5. Malignancy and chemo induced nausea/ vomiting- controlled with medications  6. Leucocytosis- likely secondary to steroids. She has agreed to cut back on the dose    Total face to face encounter time for this patient visit was 30 min. >50% of the time was  spent in counseling and coordination of care.       Visit Diagnosis 1. Leukocytosis, unspecified type   2. Malignant poorly differentiated neuroendocrine carcinoma ([place])   3. Neoplasm related pain   4. Nausea and vomiting, intractability of vomiting not specified, unspecified vomiting type      Dr. Randa Evens, MD, MPH [facility] Pager- [phone] 11/09/2016 10:38 AM

## 2016-11-09 ENCOUNTER — Inpatient Hospital Stay: Payer: Medicare Other

## 2016-11-09 ENCOUNTER — Inpatient Hospital Stay (HOSPITAL_BASED_OUTPATIENT_CLINIC_OR_DEPARTMENT_OTHER): Payer: Medicare Other | Admitting: Oncology

## 2016-11-09 ENCOUNTER — Telehealth: Payer: Self-pay | Admitting: *Deleted

## 2016-11-09 VITALS — BP 120/75 | HR 74 | Resp 20

## 2016-11-09 VITALS — BP 152/79 | HR 80 | Temp 99.1°F | Resp 18 | Wt 140.9 lb

## 2016-11-09 DIAGNOSIS — Z79899 Other long term (current) drug therapy: Secondary | ICD-10-CM

## 2016-11-09 DIAGNOSIS — C7A1 Malignant poorly differentiated neuroendocrine tumors: Secondary | ICD-10-CM

## 2016-11-09 DIAGNOSIS — Z7952 Long term (current) use of systemic steroids: Secondary | ICD-10-CM

## 2016-11-09 DIAGNOSIS — R112 Nausea with vomiting, unspecified: Secondary | ICD-10-CM

## 2016-11-09 DIAGNOSIS — K5900 Constipation, unspecified: Secondary | ICD-10-CM

## 2016-11-09 DIAGNOSIS — Z87442 Personal history of urinary calculi: Secondary | ICD-10-CM

## 2016-11-09 DIAGNOSIS — C7A026 Malignant carcinoid tumor of the rectum: Secondary | ICD-10-CM

## 2016-11-09 DIAGNOSIS — Z90710 Acquired absence of both cervix and uterus: Secondary | ICD-10-CM

## 2016-11-09 DIAGNOSIS — I10 Essential (primary) hypertension: Secondary | ICD-10-CM

## 2016-11-09 DIAGNOSIS — Z885 Allergy status to narcotic agent status: Secondary | ICD-10-CM

## 2016-11-09 DIAGNOSIS — C7951 Secondary malignant neoplasm of bone: Secondary | ICD-10-CM | POA: Diagnosis not present

## 2016-11-09 DIAGNOSIS — C787 Secondary malignant neoplasm of liver and intrahepatic bile duct: Secondary | ICD-10-CM | POA: Diagnosis not present

## 2016-11-09 DIAGNOSIS — M8580 Other specified disorders of bone density and structure, unspecified site: Secondary | ICD-10-CM

## 2016-11-09 DIAGNOSIS — D72829 Elevated white blood cell count, unspecified: Secondary | ICD-10-CM

## 2016-11-09 DIAGNOSIS — M549 Dorsalgia, unspecified: Secondary | ICD-10-CM

## 2016-11-09 DIAGNOSIS — G893 Neoplasm related pain (acute) (chronic): Secondary | ICD-10-CM

## 2016-11-09 DIAGNOSIS — C778 Secondary and unspecified malignant neoplasm of lymph nodes of multiple regions: Secondary | ICD-10-CM | POA: Diagnosis not present

## 2016-11-09 DIAGNOSIS — I700 Atherosclerosis of aorta: Secondary | ICD-10-CM

## 2016-11-09 DIAGNOSIS — R110 Nausea: Secondary | ICD-10-CM

## 2016-11-09 DIAGNOSIS — E782 Mixed hyperlipidemia: Secondary | ICD-10-CM

## 2016-11-09 LAB — CBC WITH DIFFERENTIAL/PLATELET
BASOS ABS: 0.1 10*3/uL (ref 0–0.1)
BASOS PCT: 0 %
Eosinophils Absolute: 0.1 10*3/uL (ref 0–0.7)
Eosinophils Relative: 0 %
HCT: 38.9 % (ref 35.0–47.0)
Hemoglobin: 13 g/dL (ref 12.0–16.0)
LYMPHS PCT: 10 %
Lymphs Abs: 2.4 10*3/uL (ref 1.0–3.6)
MCH: 26.8 pg (ref 26.0–34.0)
MCHC: 33.3 g/dL (ref 32.0–36.0)
MCV: 80.2 fL (ref 80.0–100.0)
MONO ABS: 1.4 10*3/uL — AB (ref 0.2–0.9)
Monocytes Relative: 6 %
NEUTROS ABS: 20.7 10*3/uL — AB (ref 1.4–6.5)
NEUTROS PCT: 84 %
PLATELETS: 261 10*3/uL (ref 150–440)
RBC: 4.84 MIL/uL (ref 3.80–5.20)
RDW: 16.4 % — AB (ref 11.5–14.5)
WBC: 24.6 10*3/uL — ABNORMAL HIGH (ref 3.6–11.0)

## 2016-11-09 LAB — COMPREHENSIVE METABOLIC PANEL
ALT: 29 U/L (ref 14–54)
ANION GAP: 9 (ref 5–15)
AST: 29 U/L (ref 15–41)
Albumin: 3.5 g/dL (ref 3.5–5.0)
Alkaline Phosphatase: 221 U/L — ABNORMAL HIGH (ref 38–126)
BUN: 33 mg/dL — ABNORMAL HIGH (ref 6–20)
CALCIUM: 8.7 mg/dL — AB (ref 8.9–10.3)
CHLORIDE: 108 mmol/L (ref 101–111)
CO2: 20 mmol/L — ABNORMAL LOW (ref 22–32)
Creatinine, Ser: 0.83 mg/dL (ref 0.44–1.00)
GFR calc non Af Amer: >60 mL/min (ref >60)
Glucose, Bld: 213 mg/dL — ABNORMAL HIGH (ref 65–99)
POTASSIUM: 4.4 mmol/L (ref 3.5–5.1)
SODIUM: 137 mmol/L (ref 135–145)
Total Bilirubin: 0.5 mg/dL (ref 0.3–1.2)
Total Protein: 6.5 g/dL (ref 6.5–8.1)

## 2016-11-09 MED ORDER — HEPARIN SOD (PORK) LOCK FLUSH 100 UNIT/ML IV SOLN
500.0000 [IU] | Freq: Once | INTRAVENOUS | Status: AC | PRN
  Administered 2016-11-09: 500 [IU]
  Filled 2016-11-09: qty 5

## 2016-11-09 MED ORDER — DEXAMETHASONE 2 MG PO TABS: 2.0000 mg | ORAL_TABLET | Freq: Every day | ORAL | 0 refills | Status: DC

## 2016-11-09 MED ORDER — PALONOSETRON HCL INJECTION 0.25 MG/5ML
0.2500 mg | Freq: Once | INTRAVENOUS | Status: AC
  Administered 2016-11-09: 0.25 mg via INTRAVENOUS
  Filled 2016-11-09: qty 5

## 2016-11-09 MED ORDER — SODIUM CHLORIDE 0.9 % IV SOLN
100.0000 mg/m2 | Freq: Once | INTRAVENOUS | Status: AC
  Administered 2016-11-09: 180 mg via INTRAVENOUS
  Filled 2016-11-09: qty 9

## 2016-11-09 MED ORDER — SODIUM CHLORIDE 0.9 % IV SOLN
Freq: Once | INTRAVENOUS | Status: AC
  Administered 2016-11-09: 11:00:00 via INTRAVENOUS
  Filled 2016-11-09: qty 1000

## 2016-11-09 MED ORDER — SODIUM CHLORIDE 0.9 % IV SOLN
417.5000 mg | Freq: Once | INTRAVENOUS | Status: AC
  Administered 2016-11-09: 420 mg via INTRAVENOUS
  Filled 2016-11-09: qty 42

## 2016-11-09 MED ORDER — DEXAMETHASONE SODIUM PHOSPHATE 10 MG/ML IJ SOLN
10.0000 mg | Freq: Once | INTRAMUSCULAR | Status: AC
Start: 2016-11-09 — End: 2016-11-09
  Administered 2016-11-09: 10 mg via INTRAVENOUS
  Filled 2016-11-09: qty 1

## 2016-11-09 NOTE — Progress Notes (Signed)
Pt states she is doing better today . Asked if she could take astragalus as a supplement . In good spirits today .

## 2016-11-09 NOTE — Telephone Encounter (Signed)
I got a message from [employer] that I needed to call them about what to do for constipation. I called back to the cell phone number left for me and got voicemail and I told her since she is taking oxycodone back again she should be on senna 2 tablets at night and then if no BM after 2-3 days then take  Miralax.  Can call me back and left my number [phone].

## 2016-11-10 ENCOUNTER — Inpatient Hospital Stay: Payer: Medicare Other

## 2016-11-10 VITALS — BP 138/86 | HR 80 | Temp 99.0°F | Resp 18

## 2016-11-10 DIAGNOSIS — C7A1 Malignant poorly differentiated neuroendocrine tumors: Secondary | ICD-10-CM

## 2016-11-10 DIAGNOSIS — Z7189 Other specified counseling: Secondary | ICD-10-CM

## 2016-11-10 DIAGNOSIS — C7A026 Malignant carcinoid tumor of the rectum: Secondary | ICD-10-CM | POA: Diagnosis not present

## 2016-11-10 MED ORDER — DEXAMETHASONE SODIUM PHOSPHATE 10 MG/ML IJ SOLN
10.0000 mg | Freq: Once | INTRAMUSCULAR | Status: AC
  Administered 2016-11-10: 10 mg via INTRAVENOUS
  Filled 2016-11-10: qty 1

## 2016-11-10 MED ORDER — SODIUM CHLORIDE 0.9% FLUSH
10.0000 mL | INTRAVENOUS | Status: DC | PRN
  Administered 2016-11-10: 10 mL
  Filled 2016-11-10: qty 10

## 2016-11-10 MED ORDER — SODIUM CHLORIDE 0.9 % IV SOLN
Freq: Once | INTRAVENOUS | Status: AC
  Administered 2016-11-10: 14:00:00 via INTRAVENOUS
  Filled 2016-11-10: qty 1000

## 2016-11-10 MED ORDER — SODIUM CHLORIDE 0.9 % IV SOLN
100.0000 mg/m2 | Freq: Once | INTRAVENOUS | Status: AC
  Administered 2016-11-10: 180 mg via INTRAVENOUS
  Filled 2016-11-10: qty 9

## 2016-11-10 MED ORDER — HEPARIN SOD (PORK) LOCK FLUSH 100 UNIT/ML IV SOLN
500.0000 [IU] | Freq: Once | INTRAVENOUS | Status: AC | PRN
  Administered 2016-11-10: 500 [IU]
  Filled 2016-11-10: qty 5

## 2016-11-11 ENCOUNTER — Inpatient Hospital Stay: Payer: Medicare Other | Attending: Oncology

## 2016-11-11 VITALS — BP 113/74 | HR 78 | Temp 99.0°F | Resp 18

## 2016-11-11 DIAGNOSIS — C7951 Secondary malignant neoplasm of bone: Secondary | ICD-10-CM | POA: Insufficient documentation

## 2016-11-11 DIAGNOSIS — Z79899 Other long term (current) drug therapy: Secondary | ICD-10-CM | POA: Diagnosis not present

## 2016-11-11 DIAGNOSIS — R5383 Other fatigue: Secondary | ICD-10-CM | POA: Insufficient documentation

## 2016-11-11 DIAGNOSIS — R5381 Other malaise: Secondary | ICD-10-CM | POA: Diagnosis not present

## 2016-11-11 DIAGNOSIS — Z87442 Personal history of urinary calculi: Secondary | ICD-10-CM | POA: Diagnosis not present

## 2016-11-11 DIAGNOSIS — C7800 Secondary malignant neoplasm of unspecified lung: Secondary | ICD-10-CM | POA: Diagnosis not present

## 2016-11-11 DIAGNOSIS — Z5111 Encounter for antineoplastic chemotherapy: Secondary | ICD-10-CM | POA: Diagnosis not present

## 2016-11-11 DIAGNOSIS — E782 Mixed hyperlipidemia: Secondary | ICD-10-CM | POA: Insufficient documentation

## 2016-11-11 DIAGNOSIS — K640 First degree hemorrhoids: Secondary | ICD-10-CM | POA: Diagnosis not present

## 2016-11-11 DIAGNOSIS — I10 Essential (primary) hypertension: Secondary | ICD-10-CM | POA: Diagnosis not present

## 2016-11-11 DIAGNOSIS — C778 Secondary and unspecified malignant neoplasm of lymph nodes of multiple regions: Secondary | ICD-10-CM | POA: Insufficient documentation

## 2016-11-11 DIAGNOSIS — C7A026 Malignant carcinoid tumor of the rectum: Secondary | ICD-10-CM | POA: Diagnosis not present

## 2016-11-11 DIAGNOSIS — Z800 Family history of malignant neoplasm of digestive organs: Secondary | ICD-10-CM | POA: Diagnosis not present

## 2016-11-11 DIAGNOSIS — M8580 Other specified disorders of bone density and structure, unspecified site: Secondary | ICD-10-CM | POA: Insufficient documentation

## 2016-11-11 DIAGNOSIS — C7A1 Malignant poorly differentiated neuroendocrine tumors: Secondary | ICD-10-CM

## 2016-11-11 DIAGNOSIS — C787 Secondary malignant neoplasm of liver and intrahepatic bile duct: Secondary | ICD-10-CM | POA: Insufficient documentation

## 2016-11-11 DIAGNOSIS — Z7189 Other specified counseling: Secondary | ICD-10-CM

## 2016-11-11 MED ORDER — DEXAMETHASONE SODIUM PHOSPHATE 10 MG/ML IJ SOLN
10.0000 mg | Freq: Once | INTRAMUSCULAR | Status: AC
Start: 2016-11-11 — End: 2016-11-11
  Administered 2016-11-11: 10 mg via INTRAVENOUS
  Filled 2016-11-11: qty 1

## 2016-11-11 MED ORDER — HEPARIN SOD (PORK) LOCK FLUSH 100 UNIT/ML IV SOLN
500.0000 [IU] | Freq: Once | INTRAVENOUS | Status: AC | PRN
  Administered 2016-11-11: 500 [IU]
  Filled 2016-11-11: qty 5

## 2016-11-11 MED ORDER — SODIUM CHLORIDE 0.9 % IV SOLN
Freq: Once | INTRAVENOUS | Status: AC
  Administered 2016-11-11: 14:00:00 via INTRAVENOUS
  Filled 2016-11-11: qty 1000

## 2016-11-11 MED ORDER — SODIUM CHLORIDE 0.9 % IV SOLN
100.0000 mg/m2 | Freq: Once | INTRAVENOUS | Status: AC
  Administered 2016-11-11: 180 mg via INTRAVENOUS
  Filled 2016-11-11: qty 9

## 2016-11-15 ENCOUNTER — Encounter: Payer: Self-pay | Admitting: Oncology

## 2016-11-24 ENCOUNTER — Telehealth: Payer: Self-pay | Admitting: *Deleted

## 2016-11-24 ENCOUNTER — Other Ambulatory Visit: Payer: Self-pay | Admitting: *Deleted

## 2016-11-24 MED ORDER — NYSTATIN 100000 UNIT/ML MT SUSP: 5.0000 mL | Freq: Four times a day (QID) | OROMUCOSAL | 0 refills | Status: DC

## 2016-11-24 NOTE — Telephone Encounter (Signed)
Husband states that pt has had sore throat for about 2 weeks, no fever, he looked in her mouth and saw white patches on roof of mouth, spoek to dr B on call and he said to give her nystatin and I called it in to her pharmacy and asked husband to call in 3 days of the nystatin does not work for her and he was agreeable to the plan

## 2016-11-26 ENCOUNTER — Other Ambulatory Visit: Payer: Self-pay | Admitting: Internal Medicine

## 2016-11-29 ENCOUNTER — Telehealth: Payer: Self-pay | Admitting: *Deleted

## 2016-11-29 ENCOUNTER — Ambulatory Visit
Admission: RE | Admit: 2016-11-29 | Discharge: 2016-11-29 | Disposition: A | Discharge status: Home / Self Care | Payer: Medicare Other | Source: Ambulatory Visit | Attending: Oncology | Admitting: Oncology

## 2016-11-29 DIAGNOSIS — C7A1 Malignant poorly differentiated neuroendocrine tumors: Secondary | ICD-10-CM | POA: Diagnosis present

## 2016-11-29 DIAGNOSIS — C787 Secondary malignant neoplasm of liver and intrahepatic bile duct: Secondary | ICD-10-CM | POA: Insufficient documentation

## 2016-11-29 DIAGNOSIS — C7951 Secondary malignant neoplasm of bone: Secondary | ICD-10-CM | POA: Diagnosis not present

## 2016-11-29 DIAGNOSIS — M4856XA Collapsed vertebra, not elsewhere classified, lumbar region, initial encounter for fracture: Secondary | ICD-10-CM | POA: Insufficient documentation

## 2016-11-29 DIAGNOSIS — S72001A Fracture of unspecified part of neck of right femur, initial encounter for closed fracture: Secondary | ICD-10-CM | POA: Diagnosis not present

## 2016-11-29 DIAGNOSIS — M4854XA Collapsed vertebra, not elsewhere classified, thoracic region, initial encounter for fracture: Secondary | ICD-10-CM | POA: Diagnosis not present

## 2016-11-29 DIAGNOSIS — S72002A Fracture of unspecified part of neck of left femur, initial encounter for closed fracture: Secondary | ICD-10-CM | POA: Diagnosis not present

## 2016-11-29 DIAGNOSIS — C7800 Secondary malignant neoplasm of unspecified lung: Secondary | ICD-10-CM | POA: Diagnosis not present

## 2016-11-29 DIAGNOSIS — X58XXXA Exposure to other specified factors, initial encounter: Secondary | ICD-10-CM | POA: Diagnosis not present

## 2016-11-29 MED ORDER — IOPAMIDOL (ISOVUE-300) INJECTION 61%
100.0000 mL | Freq: Once | INTRAVENOUS | Status: AC | PRN
  Administered 2016-11-29: 100 mL via INTRAVENOUS

## 2016-11-29 NOTE — Telephone Encounter (Signed)
IMPRESSION: 1. Interval mixed response to therapy. 2. Significant interval progression of multifocal lytic bone metastases. There are multiple new compression fractures involving the thoracic and lumbar spine. Additionally, there are bilateral femoral neck fractures which may be of impending orthopedic significance. 3. Improvement and liver metastases. 4. Stable to improved appearance of pulmonary metastasis. These results will be called to the ordering clinician or representative by the Radiologist Assistant, and communication documented in the PACS or zVision Dashboard.   Electronically Signed   By: Kerby Moors M.D.   On: 11/29/2016 10:27

## 2016-11-29 NOTE — Telephone Encounter (Signed)
I have sent message to Dr. Janese Banks. She is off today but asked her to get in touch with me about this matter.

## 2016-11-29 NOTE — Telephone Encounter (Signed)
Per Dr Grayland Ormond, she will most likely need referral to [facility]

## 2016-11-30 ENCOUNTER — Ambulatory Visit
Admission: RE | Admit: 2016-11-30 | Discharge: 2016-11-30 | Disposition: A | Discharge status: Home / Self Care | Payer: Medicare Other | Source: Ambulatory Visit | Attending: Radiation Oncology | Admitting: Radiation Oncology

## 2016-11-30 ENCOUNTER — Inpatient Hospital Stay: Payer: Medicare Other

## 2016-11-30 ENCOUNTER — Inpatient Hospital Stay (HOSPITAL_BASED_OUTPATIENT_CLINIC_OR_DEPARTMENT_OTHER): Payer: Medicare Other | Admitting: Oncology

## 2016-11-30 VITALS — BP 131/87 | HR 101 | Temp 98.7°F | Resp 18 | Wt 145.3 lb

## 2016-11-30 DIAGNOSIS — I10 Essential (primary) hypertension: Secondary | ICD-10-CM | POA: Insufficient documentation

## 2016-11-30 DIAGNOSIS — Z510 Encounter for antineoplastic radiation therapy: Secondary | ICD-10-CM | POA: Diagnosis not present

## 2016-11-30 DIAGNOSIS — C787 Secondary malignant neoplasm of liver and intrahepatic bile duct: Secondary | ICD-10-CM | POA: Insufficient documentation

## 2016-11-30 DIAGNOSIS — C778 Secondary and unspecified malignant neoplasm of lymph nodes of multiple regions: Secondary | ICD-10-CM | POA: Diagnosis not present

## 2016-11-30 DIAGNOSIS — E785 Hyperlipidemia, unspecified: Secondary | ICD-10-CM | POA: Diagnosis not present

## 2016-11-30 DIAGNOSIS — Z79899 Other long term (current) drug therapy: Secondary | ICD-10-CM

## 2016-11-30 DIAGNOSIS — M8580 Other specified disorders of bone density and structure, unspecified site: Secondary | ICD-10-CM

## 2016-11-30 DIAGNOSIS — E7800 Pure hypercholesterolemia, unspecified: Secondary | ICD-10-CM | POA: Insufficient documentation

## 2016-11-30 DIAGNOSIS — N6019 Diffuse cystic mastopathy of unspecified breast: Secondary | ICD-10-CM | POA: Diagnosis not present

## 2016-11-30 DIAGNOSIS — R5381 Other malaise: Secondary | ICD-10-CM | POA: Diagnosis not present

## 2016-11-30 DIAGNOSIS — K640 First degree hemorrhoids: Secondary | ICD-10-CM

## 2016-11-30 DIAGNOSIS — Z9884 Bariatric surgery status: Secondary | ICD-10-CM | POA: Insufficient documentation

## 2016-11-30 DIAGNOSIS — C7951 Secondary malignant neoplasm of bone: Secondary | ICD-10-CM

## 2016-11-30 DIAGNOSIS — J45909 Unspecified asthma, uncomplicated: Secondary | ICD-10-CM | POA: Insufficient documentation

## 2016-11-30 DIAGNOSIS — F419 Anxiety disorder, unspecified: Secondary | ICD-10-CM | POA: Insufficient documentation

## 2016-11-30 DIAGNOSIS — E162 Hypoglycemia, unspecified: Secondary | ICD-10-CM | POA: Insufficient documentation

## 2016-11-30 DIAGNOSIS — C7A1 Malignant poorly differentiated neuroendocrine tumors: Secondary | ICD-10-CM | POA: Insufficient documentation

## 2016-11-30 DIAGNOSIS — Z880 Allergy status to penicillin: Secondary | ICD-10-CM

## 2016-11-30 DIAGNOSIS — E782 Mixed hyperlipidemia: Secondary | ICD-10-CM | POA: Diagnosis not present

## 2016-11-30 DIAGNOSIS — F988 Other specified behavioral and emotional disorders with onset usually occurring in childhood and adolescence: Secondary | ICD-10-CM | POA: Diagnosis not present

## 2016-11-30 DIAGNOSIS — R5383 Other fatigue: Secondary | ICD-10-CM

## 2016-11-30 DIAGNOSIS — C7A026 Malignant carcinoid tumor of the rectum: Secondary | ICD-10-CM

## 2016-11-30 DIAGNOSIS — Z87442 Personal history of urinary calculi: Secondary | ICD-10-CM | POA: Diagnosis not present

## 2016-11-30 DIAGNOSIS — Z8742 Personal history of other diseases of the female genital tract: Secondary | ICD-10-CM

## 2016-11-30 DIAGNOSIS — Z800 Family history of malignant neoplasm of digestive organs: Secondary | ICD-10-CM | POA: Diagnosis not present

## 2016-11-30 DIAGNOSIS — C7800 Secondary malignant neoplasm of unspecified lung: Secondary | ICD-10-CM

## 2016-11-30 LAB — COMPREHENSIVE METABOLIC PANEL
ALK PHOS: 218 U/L — AB (ref 38–126)
ALT: 29 U/L (ref 14–54)
ANION GAP: 8 (ref 5–15)
AST: 24 U/L (ref 15–41)
Albumin: 3.8 g/dL (ref 3.5–5.0)
BUN: 28 mg/dL — ABNORMAL HIGH (ref 6–20)
CALCIUM: 9.2 mg/dL (ref 8.9–10.3)
CHLORIDE: 107 mmol/L (ref 101–111)
CO2: 24 mmol/L (ref 22–32)
CREATININE: 0.68 mg/dL (ref 0.44–1.00)
Glucose, Bld: 159 mg/dL — ABNORMAL HIGH (ref 65–99)
Potassium: 3.5 mmol/L (ref 3.5–5.1)
SODIUM: 139 mmol/L (ref 135–145)
Total Bilirubin: 0.6 mg/dL (ref 0.3–1.2)
Total Protein: 7 g/dL (ref 6.5–8.1)

## 2016-11-30 LAB — CBC WITH DIFFERENTIAL/PLATELET
BASOS ABS: 0.1 10*3/uL (ref 0–0.1)
Basophils Relative: 1 %
EOS PCT: 0 %
Eosinophils Absolute: 0 10*3/uL (ref 0–0.7)
HEMATOCRIT: 35.4 % (ref 35.0–47.0)
HEMOGLOBIN: 12.1 g/dL (ref 12.0–16.0)
LYMPHS ABS: 3.5 10*3/uL (ref 1.0–3.6)
LYMPHS PCT: 22 %
MCH: 27.4 pg (ref 26.0–34.0)
MCHC: 34.1 g/dL (ref 32.0–36.0)
MCV: 80.3 fL (ref 80.0–100.0)
Monocytes Absolute: 1.5 10*3/uL — ABNORMAL HIGH (ref 0.2–0.9)
Monocytes Relative: 9 %
NEUTROS ABS: 11 10*3/uL — AB (ref 1.4–6.5)
Neutrophils Relative %: 68 %
PLATELETS: 251 10*3/uL (ref 150–440)
RBC: 4.42 MIL/uL (ref 3.80–5.20)
RDW: 18 % — ABNORMAL HIGH (ref 11.5–14.5)
WBC: 16.1 10*3/uL — AB (ref 3.6–11.0)

## 2016-11-30 MED ORDER — SODIUM CHLORIDE 0.9% FLUSH
10.0000 mL | INTRAVENOUS | Status: DC | PRN
  Administered 2016-11-30: 10 mL via INTRAVENOUS
  Filled 2016-11-30: qty 10

## 2016-11-30 MED ORDER — HEPARIN SOD (PORK) LOCK FLUSH 100 UNIT/ML IV SOLN
500.0000 [IU] | Freq: Once | INTRAVENOUS | Status: AC
  Administered 2016-11-30: 500 [IU] via INTRAVENOUS

## 2016-11-30 MED ORDER — SODIUM CHLORIDE 0.9 % IV SOLN
Freq: Once | INTRAVENOUS | Status: AC
  Administered 2016-11-30: 11:00:00 via INTRAVENOUS
  Filled 2016-11-30: qty 1000

## 2016-11-30 MED ORDER — SODIUM CHLORIDE 0.9 % IV SOLN
3.3000 mg | Freq: Once | INTRAVENOUS | Status: AC
  Administered 2016-11-30: 3.3 mg via INTRAVENOUS
  Filled 2016-11-30: qty 4.13

## 2016-11-30 MED ORDER — HEPARIN SOD (PORK) LOCK FLUSH 100 UNIT/ML IV SOLN
500.0000 [IU] | Freq: Once | INTRAVENOUS | Status: AC | PRN
  Filled 2016-11-30: qty 5

## 2016-11-30 NOTE — Consult Note (Signed)
NEW PATIENT EVALUATION  Name: Sharon Hester  MRN: [id number]  Date:   11/30/2016     DOB: 05-09-1951   This 66 y.o. 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  REFERRING PHYSICIAN: Tower, Wynelle Fanny, MD  CHIEF COMPLAINT: No chief complaint on file.   DIAGNOSIS: The encounter diagnosis was Bone metastases ([place]).   PREVIOUS INVESTIGATIONS:  PET CT scan CT scans reviewed Clinical notes reviewed Pathology reports reviewed  HPI: Patient is a 66 year old female whose husband is a former patient of mine who presented with right-sided abdominal pain in December 2017. Ultrasound discovered lesions of the liver consistent with metastatic disease. She had noted on CT scan numerous osseous lesions including the inferior aspect of L3 vertebral body. PET scan demonstrated widespread metastatic disease with mediastinal left hilar multifocal pulmonary nodules extensive liver metastasis as well as involvement of the lumbar spine with pathologic fracture of L2. Ultrasound-guided biopsy of the liver showed high-grade neuroendocrine carcinoma. Patient was started on carboplatinum etoposide as well as Zometa. Foundation 1 testing showed PDL 1 expression 0. Most recent CT scan showed mixed response with liver and lung lesions responding although there is progression of bony metastasis with new compression fracture of her lumbar spine and lytic lesions bilaterally in the femoral necks. Orthopedic surgery has declined surgical intervention at this time. She is now referred to radiation oncology for consideration of palliative treatment. She's emulating with a cane really not in significant pain at this time most of her pain is centered in her lower back.  PLANNED TREATMENT REGIMEN: Palliative radiation therapy to lumbar spine and SI joints and bilateral femoral necks  PAST MEDICAL HISTORY:  has a past  medical history of Allergic rhinitis; Anxiety; Asthma; Attention deficit disorder without mention of hyperactivity; Calculus of kidney; Cataract; Diffuse cystic mastopathy; High cholesterol; History of kidney stones; Hypoglycemia, unspecified; Migraine, unspecified, without mention of intractable migraine without mention of status migrainosus; Mixed hyperlipidemia; Osteopenia; Precancerous lesion; and Unspecified essential hypertension.    PAST SURGICAL HISTORY:  Past Surgical History:  Procedure Laterality Date  . ABDOMINAL HYSTERECTOMY     10/2002  . APPENDECTOMY    . COLONOSCOPY  5/06  . COLONOSCOPY WITH PROPOFOL N/A 05/12/2015   Procedure: COLONOSCOPY WITH PROPOFOL;  Surgeon: Manya Silvas, MD;  Location: [facility] ENDOSCOPY;  Service: Endoscopy;  Laterality: N/A;  . COLONOSCOPY WITH PROPOFOL N/A 09/27/2016   Procedure: COLONOSCOPY WITH PROPOFOL;  Surgeon: Manya Silvas, MD;  Location: [facility] ENDOSCOPY;  Service: Endoscopy;  Laterality: N/A;  . EYE SURGERY     multiple; s/p childhood trauma  . LITHOTRIPSY    . 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 PERIPHERAL VASCULAR CATHETERIZATION N/A 11/04/2016   Procedure: Glori Luis Cath Insertion;  Surgeon: Algernon Huxley, MD;  Location: [facility];  Service: Cardiovascular;  Laterality: N/A;  . TONSILLECTOMY      FAMILY HISTORY: family history includes Colon cancer in her father; Coronary artery disease in her maternal grandmother and mother; Diabetes in her father; Heart attack in her father and mother; Hyperlipidemia in her mother; Hypertension in her father; Other in her brother and brother; Pancreatic cancer in her mother.  SOCIAL HISTORY:  reports that she has never smoked. She has never used smokeless tobacco. She reports that she drinks about 0.6 oz of alcohol per week . She reports that she does not use drugs.  ALLERGIES: Codeine  MEDICATIONS:  Current Outpatient Prescriptions  Medication Sig Dispense Refill  . amLODipine  (NORVASC) 5 MG tablet Take 1 tablet (5 mg total) by mouth daily. 90 tablet 3  . cetirizine (ZYRTEC) 10 MG tablet Take 10 mg by mouth daily.      . cholecalciferol (VITAMIN D) 1000 UNITS tablet Take 1,000 Units by mouth daily.      . Coenzyme Q10 (COQ10) 400 MG CAPS Take 400 mg by mouth daily.     Marland Kitchen dexamethasone (DECADRON) 2 MG tablet Take 1 tablet (2 mg total) by mouth daily. (Patient taking differently: Take 1 mg by mouth daily. ) 30 tablet 0  . Flaxseed, Linseed, (FLAX SEED OIL) 1000 MG CAPS Take 1 capsule by mouth daily.    . fluticasone (FLONASE) 50 MCG/ACT nasal spray Place 2 sprays into both nostrils daily. 48 g 3  . HYDROcodone-acetaminophen (NORCO/VICODIN) 5-325 MG tablet Take 1-2 tablets by mouth every 6 (six) hours as needed. (Patient not taking: Reported on 11/09/2016) 40 tablet 0  . lidocaine (LIDODERM) 5 % Place 1 patch onto the skin daily. Remove & Discard patch within 12 hours of use each day (Patient not taking: Reported on 11/30/2016) 30 patch 0  . lidocaine-prilocaine (EMLA) cream Apply 1 application topically as needed. Apply to port 2 hours prior to appt 30 g 2  . LUMIGAN 0.01 % SOLN Place 1 drop into the right eye at bedtime.  0  . morphine (MS CONTIN) 15 MG 12 hr tablet Take 15 mg by mouth every 12 (twelve) hours.  0  . morphine (MSIR) 15 MG tablet take 1 tablet by mouth every 4 hours if needed for severe pain  0  . Multiple Vitamin (MULTIVITAMIN) tablet Take 1 tablet by mouth daily.      . naloxone (NARCAN) nasal spray 4 mg/0.1 mL Place 1 spray into the nose daily as needed (opioid overdose).    . nitrofurantoin, macrocrystal-monohydrate, (MACROBID) 100 MG capsule Take 1 capsule (100 mg total) by mouth 2 (two) times daily. (Patient not taking: Reported on 11/09/2016) 14 capsule 0  . nystatin (MYCOSTATIN) 100000 UNIT/ML suspension take 5 milliliters by mouth four times a day 120 mL 0  . Omega-3 Fatty Acids (FISH OIL) 1200 MG CAPS Take 1,200 mg by mouth daily.     . ondansetron  (ZOFRAN) 4 MG tablet take 1 tablet by mouth every 6 hours if needed for nausea and vomiting  0  . ondansetron (ZOFRAN-ODT) 4 MG disintegrating tablet Take 4 mg by mouth every 8 (eight) hours as needed for nausea or vomiting.     . Oxycodone HCl 10 MG TABS take 10mg  by mouth twice a day if needed for pain  0  . Probiotic Product (PROBIOTIC DAILY PO) Take 1 capsule by mouth daily.    . prochlorperazine (COMPAZINE) 10 MG tablet Take 1 tablet (10 mg total) by mouth every 6 (six) hours as needed for nausea or vomiting. (Patient not taking: Reported on 11/09/2016) 30 tablet 0  . rosuvastatin (CRESTOR) 20 MG tablet Take one and one half tablets by mouth once daily for cholesterol (Patient taking differently: Take 30 mg by mouth daily. ) 135 tablet 3  . venlafaxine XR (EFFEXOR-XR) 75 MG 24 hr capsule take 1 capsule by mouth once daily 90 capsule 3   No current facility-administered medications for this encounter.    Facility-Administered Medications Ordered in Other Encounters  Medication Dose Route Frequency Provider Last Rate Last Dose  . heparin lock flush 100 unit/mL  500 Units Intracatheter Once PRN Sindy Guadeloupe, MD      . sodium chloride flush (NS) 0.9 % injection 10 mL  10 mL Intravenous PRN Sindy Guadeloupe, MD   10 mL at 11/30/16 0905    ECOG PERFORMANCE STATUS:  1 - Symptomatic but completely ambulatory  REVIEW OF SYSTEMS: Except for the significant pain Patient denies any weight loss, fatigue, weakness, fever, chills or night sweats. Patient denies any loss of vision, blurred vision. Patient denies any ringing  of the ears or hearing loss. No irregular heartbeat. Patient denies heart murmur or history of fainting. Patient denies any chest pain or pain radiating to her upper extremities. Patient denies any shortness of breath, difficulty breathing at night, cough or hemoptysis. Patient denies any swelling in the lower legs. Patient denies any nausea vomiting, vomiting of blood, or coffee ground  material in the vomitus. Patient denies any stomach pain. Patient states has had normal bowel movements no significant constipation or diarrhea. Patient denies any dysuria, hematuria or significant nocturia. Patient denies any problems walking, swelling in the joints or loss of balance. Patient denies any skin changes, loss of hair or loss of weight. Patient denies any excessive worrying or anxiety or significant depression. Patient denies any problems with insomnia. Patient denies excessive thirst, polyuria, polydipsia. Patient denies any swollen glands, patient denies easy bruising or easy bleeding. Patient denies any recent infections, allergies or URI. Patient "s visual fields have not changed significantly in recent time.    PHYSICAL EXAM: There were no vitals taken for this visit. Deep palpation of her spine does not elicit pain motor sensory and DTR levels are equal and symmetric in the upper and lower extremities proprioception is intact. Range of motion of her lower extremities does not elicit pain. Well-developed well-nourished patient in NAD. HEENT reveals PERLA, EOMI, discs not visualized.  Oral cavity is clear. No oral mucosal lesions are identified. Neck is clear without evidence of cervical or supraclavicular adenopathy. Lungs are clear to A&P. Cardiac examination is essentially unremarkable with regular rate and rhythm without murmur rub or thrill. Abdomen is benign with no organomegaly or masses noted. Motor sensory and DTR levels are equal and symmetric in the upper and lower extremities. Cranial nerves II through XII are grossly intact. Proprioception is intact. No peripheral adenopathy or edema is identified. No motor or sensory levels are noted. Crude visual fields are within normal range.  LABORATORY DATA: Pathology reports reviewed    RADIOLOGY RESULTS: CT scans and PET/CT scans reviewed in serial fashion   IMPRESSION: Progressive high-grade neuroendocrine tumor acting like small  cell cancer with progressive disease in her lumbar spine and SI joints as well as bilateral femoral necks.  PLAN: At this time I to ahead with palliative radiation therapy to her lumbar spine. Would plan on delivering 3000 cGy in 15 fractions. I would use the hyperfractionated dosing based on the large quantity of bowel that would be my treatment field. We'll also give 1 fraction of 800 cGy to bilateral femoral heads. Risks and benefits of treatment including skin reaction fatigue alteration of blood counts possible diarrhea and abdominal complaints all were discussed in detail with the patient and her husband. I first ordered and set up CT simulation for tomorrow. Patient and her husband both seem to comprehend my treatment plan well. Case was discussed with medical oncology.  I would like to take this opportunity to thank you for allowing me to participate in the care of your patient.Marland Kitchen

## 2016-11-30 NOTE — Progress Notes (Signed)
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 patient is a 66 year old female follows up with [facility] clinic GI. He has a history of villous adenoma of the rectum in 2005 status post excision. Subsequent colonoscopies have been negative for malignancy.Colonoscopy on 05/12/2015 which showed sessile polyps in the sigmoid colon and hepatic flexure along with grade 1 internal hemorrhoids.   2. She had been having some right-sided abdominal pain and was seen in urgent carein December 2069fr what was thought to be pain from her kidney stones. She was seen by PCP and urology and the kidney stones were thought to be small and nonobstructing and did not feel that this is the cause for abdominal pain   3. She underwent ultrasound of the abdomen for further evaluation on 09/23/2016 which showed a solid mass in the right lobe of the liver measuring 4.8 x 5 x 5 cm. This was followed by a CT abdomen the same day which showed a hypoattenuating/hypoenhancing liver lesions which were new since 2010. Index posterior right hepatic lobe lesion was 4.9 x 4.7 cm,lesion straddling the anterior right and medial left hepatic lobe measures 3.5 x 5.2 cm. Weight left-sided lesions up to 1.2 cm. Suspicion of asymmetric wall thickening in the posterior left side of the rectum. Borderline to mild left inguinal adenopathy of 1 cm and perirectal adenopathy up to 1.8 cm suspicious presacral node measuring 8 mm. New multifocal nauseous lesions including within  the inferior aspect of the L3 vertebral body. Bibasilar pulmonary nodules suspicious for malignancy  4. Patient had a repeat colonoscopy on 09/27/2016 which showed many polyps in the rectum and the rectosigmoid colon which were biopsied. Internal hemorrhoids. Biopsy showed hyperplastic polyps negative for dysplasia and malignancy  5. PET CT scan showed widespread metastatic disease with hypermetabolism in the mediastinal and left hilar lymph nodes. Multifocal pulmonary nodules compatible with thoracic metastases. Extensive liver metastases as well as upper abdominal and pelvic lymph node metastases. Widespread nauseous metastatic disease and at least one pathologic fracture involving the L2 vertebra.  6. Ultrasound-guided biopsy of the liver showed high-grade neuroendocrine carcinoma with Ki-67 of 80%. CD 56 positive and synatophysindiffuse moderate staining.  7. Patient received cycle 1 of chemotherapy carboplatin/etoposidefrom 10/18/2016 to 10/20/2016  8. Patient received 1st dose of zometa on 11/03/16  9. Foundation one testing was done which showed: PDL1 expression was 0%. 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 MSI stable.   10. [facility] and etoposide first-line  Interval history- patient states that she has been tapering down her dose of steroids and is down to 1 mg every other day. Reports that she is barely in any pain and has hardly been using any pain medications.She has been ambulating with a cane and has not required a walker. Denies any falls.  ECOG PS- 2 Pain scale- 2 Opioid associated constipation- no  Review of systems- Review of Systems  Constitutional: Positive for malaise/fatigue.     Current treatment- status post 2 cycles of carboplatin  and etoposide  Allergies  Allergen Reactions  . Codeine Nausea And Vomiting     Past Medical History:  Diagnosis Date  . Allergic rhinitis   .  Anxiety   . Asthma   . Attention deficit disorder without mention of hyperactivity   . Calculus of kidney   . Cataract   . Diffuse cystic mastopathy   . High cholesterol   . History of kidney stones   . Hypoglycemia, unspecified   . Migraine, unspecified, without mention of intractable migraine without mention of status migrainosus   . Mixed hyperlipidemia   . Osteopenia   . Precancerous lesion   . Unspecified essential hypertension      Past Surgical History:  Procedure Laterality Date  . ABDOMINAL HYSTERECTOMY     10/2002  . APPENDECTOMY    . COLONOSCOPY  5/06  . COLONOSCOPY WITH PROPOFOL N/A 05/12/2015   Procedure: COLONOSCOPY WITH PROPOFOL;  Surgeon: Manya Silvas, MD;  Location: [facility] ENDOSCOPY;  Service: Endoscopy;  Laterality: N/A;  . COLONOSCOPY WITH PROPOFOL N/A 09/27/2016   Procedure: COLONOSCOPY WITH PROPOFOL;  Surgeon: Manya Silvas, MD;  Location: [facility] ENDOSCOPY;  Service: Endoscopy;  Laterality: N/A;  . EYE SURGERY     multiple; s/p childhood trauma  . LITHOTRIPSY    . 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 PERIPHERAL VASCULAR CATHETERIZATION N/A 11/04/2016   Procedure: Glori Luis Cath Insertion;  Surgeon: Algernon Huxley, MD;  Location: [facility] CV LAB;  Service: Cardiovascular;  Laterality: N/A;  . TONSILLECTOMY      Social History   Social History  . Marital status: Married    Spouse name: N/A  . Number of children: 2  . Years of education: N/A   Occupational History  . Retired    Social History Main Topics  . Smoking status: Never Smoker  . Smokeless tobacco: Never Used  . Alcohol use 0.6 oz/week    1 Shots of liquor per week     Comment: occasional last  month  . Drug use: No  . Sexual activity: No   Other Topics Concern  . 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 Diabetes Father   . Colon cancer Father   . Hypertension Father   . Heart attack Father   . Heart attack Mother   . Coronary artery disease Mother   . Pancreatic cancer Mother   . Hyperlipidemia Mother   . Other Brother     Congenital hyperspadias  . Other Brother     loss of kidney function in 1 kidney early in life  . Diabetes      Grandmother  . Coronary artery disease Maternal Grandmother   . Coronary artery disease      Maternal aunts and uncles  . Breast cancer Neg Hx   . Ovarian cancer Neg Hx   . Uterine cancer Neg Hx      Current Outpatient Prescriptions:  .  amLODipine (NORVASC) 5 MG tablet, Take 1 tablet (5 mg total) by mouth daily., Disp: 90 tablet, Rfl: 3 .  cetirizine (ZYRTEC) 10 MG tablet, Take 10 mg by mouth daily.  , Disp: , Rfl:  .  cholecalciferol (VITAMIN D)  1000 UNITS tablet, Take 1,000 Units by mouth daily.  , Disp: , Rfl:  .  Coenzyme Q10 (COQ10) 400 MG CAPS, Take 400 mg by mouth daily. , Disp: , Rfl:  .  dexamethasone (DECADRON) 2 MG tablet, Take 1 tablet (2 mg total) by mouth daily. (Patient taking differently: Take 1 mg by mouth daily. ), Disp: 30 tablet, Rfl: 0 .  Flaxseed, Linseed, (FLAX SEED OIL) 1000 MG CAPS, Take 1 capsule by mouth daily., Disp: , Rfl:  .  fluticasone (FLONASE) 50 MCG/ACT nasal spray, Place 2 sprays into both nostrils daily., Disp: 48 g, Rfl: 3 .  lidocaine-prilocaine (EMLA) cream, Apply 1 application topically as needed. Apply to port 2 hours prior to appt, Disp: 30 g, Rfl: 2 .  LUMIGAN 0.01 % SOLN, Place 1 drop into the right eye at bedtime., Disp: , Rfl: 0 .  Multiple Vitamin (MULTIVITAMIN) tablet, Take 1 tablet by mouth daily.  , Disp: , Rfl:  .  nystatin (MYCOSTATIN) 100000 UNIT/ML suspension, take 5 milliliters by mouth four times a day, Disp: 120 mL, Rfl: 0 .  Omega-3 Fatty Acids (FISH OIL) 1200 MG CAPS, Take 1,200 mg by mouth daily. , Disp: , Rfl:  .  Probiotic Product (PROBIOTIC DAILY PO), Take 1 capsule by mouth  daily., Disp: , Rfl:  .  rosuvastatin (CRESTOR) 20 MG tablet, Take one and one half tablets by mouth once daily for cholesterol (Patient taking differently: Take 30 mg by mouth daily. ), Disp: 135 tablet, Rfl: 3 .  venlafaxine XR (EFFEXOR-XR) 75 MG 24 hr capsule, take 1 capsule by mouth once daily, Disp: 90 capsule, Rfl: 3 .  HYDROcodone-acetaminophen (NORCO/VICODIN) 5-325 MG tablet, Take 1-2 tablets by mouth every 6 (six) hours as needed. (Patient not taking: Reported on 11/09/2016), Disp: 40 tablet, Rfl: 0 .  lidocaine (LIDODERM) 5 %, Place 1 patch onto the skin daily. Remove & Discard patch within 12 hours of use each day (Patient not taking: Reported on 11/30/2016), Disp: 30 patch, Rfl: 0 .  morphine (MS CONTIN) 15 MG 12 hr tablet, Take 15 mg by mouth every 12 (twelve) hours., Disp: , Rfl: 0 .  morphine (MSIR) 15 MG tablet, take 1 tablet by mouth every 4 hours if needed for severe pain, Disp: , Rfl: 0 .  naloxone (NARCAN) nasal spray 4 mg/0.1 mL, Place 1 spray into the nose daily as needed (opioid overdose)., Disp: , Rfl:  .  nitrofurantoin, macrocrystal-monohydrate, (MACROBID) 100 MG capsule, Take 1 capsule (100 mg total) by mouth 2 (two) times daily. (Patient not taking: Reported on 11/09/2016), Disp: 14 capsule, Rfl: 0 .  ondansetron (ZOFRAN) 4 MG tablet, take 1 tablet by mouth every 6 hours if needed for nausea and vomiting, Disp: , Rfl: 0 .  ondansetron (ZOFRAN-ODT) 4 MG disintegrating tablet, Take 4 mg by mouth every 8 (eight) hours as needed for nausea or vomiting. , Disp: , Rfl:  .  Oxycodone HCl 10 MG TABS, take 38m by mouth twice a day if needed for pain, Disp: , Rfl: 0 .  prochlorperazine (COMPAZINE) 10 MG tablet, Take 1 tablet (10 mg total) by mouth every 6 (six) hours as needed for nausea or vomiting. (Patient not taking: Reported on 11/09/2016), Disp: 30 tablet, Rfl: 0 No current facility-administered medications for this visit.   Facility-Administered Medications Ordered in Other  Visits:  .  heparin lock flush 100 unit/mL, 500 Units, Intracatheter, Once PRN, ASindy Guadeloupe MD .  sodium chloride flush (NS) 0.9 %  injection 10 mL, 10 mL, Intravenous, PRN, Sindy Guadeloupe, MD, 10 mL at 11/30/16 0905  Physical exam:  Vitals:   11/30/16 0916  BP: 131/87  Pulse: (!) 101  Resp: 18  Temp: 98.7 F (37.1 C)  TempSrc: Tympanic  Weight: 145 lb 4.5 oz (65.9 kg)   Physical Exam  Constitutional: She is oriented to person, place, and time and well-developed, well-nourished, and in no distress.  HENT:  Head: Normocephalic and atraumatic.  Eyes: EOM are normal. Pupils are equal, round, and reactive to light.  Neck: Normal range of motion.  Cardiovascular: Normal rate, regular rhythm and normal heart sounds.   Pulmonary/Chest: Effort normal and breath sounds normal.  Abdominal: Soft. Bowel sounds are normal.  Neurological: She is alert and oriented to person, place, and time.  Skin: Skin is warm and dry.     CMP Latest Ref Rng & Units 11/30/2016  Glucose 65 - 99 mg/dL 159(H)  BUN 6 - 20 mg/dL 28(H)  Creatinine 0.44 - 1.00 mg/dL 0.68  Sodium 135 - 145 mmol/L 139  Potassium 3.5 - 5.1 mmol/L 3.5  Chloride 101 - 111 mmol/L 107  CO2 22 - 32 mmol/L 24  Calcium 8.9 - 10.3 mg/dL 9.2  Total Protein 6.5 - 8.1 g/dL 7.0  Total Bilirubin 0.3 - 1.2 mg/dL 0.6  Alkaline Phos 38 - 126 U/L 218(H)  AST 15 - 41 U/L 24  ALT 14 - 54 U/L 29   CBC Latest Ref Rng & Units 11/30/2016  WBC 3.6 - 11.0 K/uL 16.1(H)  Hemoglobin 12.0 - 16.0 g/dL 12.1  Hematocrit 35.0 - 47.0 % 35.4  Platelets 150 - 440 K/uL 251    No images are attached to the encounter.  Ct Chest W Contrast  Result Date: 11/29/2016 CLINICAL DATA:  Neuroendocrine cancer.  Status post chemotherapy. EXAM: CT CHEST, ABDOMEN, AND PELVIS WITH CONTRAST TECHNIQUE: Multidetector CT imaging of the chest, abdomen and pelvis was performed following the standard protocol during bolus administration of intravenous contrast. CONTRAST:  139m  ISOVUE-300 IOPAMIDOL (ISOVUE-300) INJECTION 61% COMPARISON:  12/22/ 17 FINDINGS: CT CHEST FINDINGS Cardiovascular: The heart size appears normal. There is no pericardial effusion identified. Mediastinum/Nodes: No enlarged mediastinal, hilar, or axillary lymph nodes. Index AP window lymph node measures 8 mm, image 24 of series 2. Previously 9 mm. Thyroid gland, trachea, and esophagus demonstrate no significant findings. Lungs/Pleura: No pleural fluid. No airspace opacities identified. Scattered pulmonary nodules are again identified. 2 mm subpleural nodule within the posterior left lower lobe is identified, image 103 of series 4. Index nodule within the left upper lobe measures 5 mm, image 73 of series 4. Previously 8 mm. Perifissural nodule within the lingula measures 3 mm and is unchanged from previous exam. There is a nodule in the right lower lobe which is stable measuring 4 mm, image 111 of series 4. Musculoskeletal: Multifocal lytic bone metastases are identified. This demonstrates significant interval progression when compared with previous exam. New pathologic fractures involves T8 and T9. CT ABDOMEN PELVIS FINDINGS Hepatobiliary: The index lesion within posterior right lobe of liver measures 3.6 cm, image 60 of series 2. This is decreased from 5.7 cm previously. Index lesion within the anterior right lobe of liver measures 4.9 x 2.7 cm, image 59 of series 2. Previously 5.2 x 3.5 cm. Index lesion within segment 5 of the right lobe measures 3.3 cm, image 70 of series 2. Previously 3.5 cm. Pancreas: Unremarkable. No pancreatic ductal dilatation or surrounding inflammatory changes. Spleen: Normal in  size without focal abnormality. Adrenals/Urinary Tract: The adrenal glands are normal. Bilateral renal calculi are identified. No hydronephrosis or mass. The urinary bladder appears normal. Stomach/Bowel: Stomach is within normal limits. No evidence of bowel wall thickening, distention, or inflammatory changes.  Vascular/Lymphatic: No significant vascular findings are present. No enlarged abdominal or pelvic lymph nodes. Reproductive: Status post hysterectomy. No adnexal masses. Other: No abdominal wall hernia or abnormality. No abdominopelvic ascites. Musculoskeletal: Interval progression of multifocal lytic bone metastases. There are progressive lytic lesions involving bilateral femoral necks which may be of orthopedic significance, image number 116 of series 2 and image 119 of series 2. New pathologic fractures are noted involving lumbar vertebra including and at the L2 level. L3 inferior endplate fracture is noted and there are also fractures involving L5. IMPRESSION: 1. Interval mixed response to therapy. 2. Significant interval progression of multifocal lytic bone metastases. There are multiple new compression fractures involving the thoracic and lumbar spine. Additionally, there are bilateral femoral neck fractures which may be of impending orthopedic significance. 3. Improvement and liver metastases. 4. Stable to improved appearance of pulmonary metastasis. These results will be called to the ordering clinician or representative by the Radiologist Assistant, and communication documented in the PACS or zVision Dashboard. Electronically Signed   By: Kerby Moors M.D.   On: 11/29/2016 10:27   Ct Abdomen Pelvis W Contrast  Result Date: 11/29/2016 CLINICAL DATA:  Neuroendocrine cancer.  Status post chemotherapy. EXAM: CT CHEST, ABDOMEN, AND PELVIS WITH CONTRAST TECHNIQUE: Multidetector CT imaging of the chest, abdomen and pelvis was performed following the standard protocol during bolus administration of intravenous contrast. CONTRAST:  149m ISOVUE-300 IOPAMIDOL (ISOVUE-300) INJECTION 61% COMPARISON:  12/22/ 17 FINDINGS: CT CHEST FINDINGS Cardiovascular: The heart size appears normal. There is no pericardial effusion identified. Mediastinum/Nodes: No enlarged mediastinal, hilar, or axillary lymph nodes. Index AP  window lymph node measures 8 mm, image 24 of series 2. Previously 9 mm. Thyroid gland, trachea, and esophagus demonstrate no significant findings. Lungs/Pleura: No pleural fluid. No airspace opacities identified. Scattered pulmonary nodules are again identified. 2 mm subpleural nodule within the posterior left lower lobe is identified, image 103 of series 4. Index nodule within the left upper lobe measures 5 mm, image 73 of series 4. Previously 8 mm. Perifissural nodule within the lingula measures 3 mm and is unchanged from previous exam. There is a nodule in the right lower lobe which is stable measuring 4 mm, image 111 of series 4. Musculoskeletal: Multifocal lytic bone metastases are identified. This demonstrates significant interval progression when compared with previous exam. New pathologic fractures involves T8 and T9. CT ABDOMEN PELVIS FINDINGS Hepatobiliary: The index lesion within posterior right lobe of liver measures 3.6 cm, image 60 of series 2. This is decreased from 5.7 cm previously. Index lesion within the anterior right lobe of liver measures 4.9 x 2.7 cm, image 59 of series 2. Previously 5.2 x 3.5 cm. Index lesion within segment 5 of the right lobe measures 3.3 cm, image 70 of series 2. Previously 3.5 cm. Pancreas: Unremarkable. No pancreatic ductal dilatation or surrounding inflammatory changes. Spleen: Normal in size without focal abnormality. Adrenals/Urinary Tract: The adrenal glands are normal. Bilateral renal calculi are identified. No hydronephrosis or mass. The urinary bladder appears normal. Stomach/Bowel: Stomach is within normal limits. No evidence of bowel wall thickening, distention, or inflammatory changes. Vascular/Lymphatic: No significant vascular findings are present. No enlarged abdominal or pelvic lymph nodes. Reproductive: Status post hysterectomy. No adnexal masses. Other: No abdominal wall  hernia or abnormality. No abdominopelvic ascites. Musculoskeletal: Interval  progression of multifocal lytic bone metastases. There are progressive lytic lesions involving bilateral femoral necks which may be of orthopedic significance, image number 116 of series 2 and image 119 of series 2. New pathologic fractures are noted involving lumbar vertebra including and at the L2 level. L3 inferior endplate fracture is noted and there are also fractures involving L5. IMPRESSION: 1. Interval mixed response to therapy. 2. Significant interval progression of multifocal lytic bone metastases. There are multiple new compression fractures involving the thoracic and lumbar spine. Additionally, there are bilateral femoral neck fractures which may be of impending orthopedic significance. 3. Improvement and liver metastases. 4. Stable to improved appearance of pulmonary metastasis. These results will be called to the ordering clinician or representative by the Radiologist Assistant, and communication documented in the PACS or zVision Dashboard. Electronically Signed   By: Kerby Moors M.D.   On: 11/29/2016 10:27     Assessment and plan- Patient is a 66 y.o. female with a history of metastatic poorly differentiated high-grade neuroendocrine tumor of unknown primary  1. I discussed the results of the CT chest abdomen and pelvis with the patient in detail. I have also reviewed personally reviewed the images and discussed the findings with radiology as well. Patient has had a mixed response to treatment. Her liver and lung lesions have responded and there is no evidence of intra-abdominal adenopathy which was seen before. However there has been significant progression of her bony metastases with multifocal lytic bony metastases and new compression fractures involving her lumbar and thoracic vertebra as well as progressive lytic lesions in her bilateral femoral necks. Patient was already seen by Dr. Rudene Christians from [facility] [place] [facility]. She had plain x-rays of her bilateral hips. I spoke with Dr. Rudene Christians  this morning who stated that surgery of bilateral femur would be a very involved procedure with significant risks and at the present moment it would be probably okay to proceed with palliative radiation and close monitoring and consider surgery down the line if she were to develop a pathologic fracture of her hips. Also kyphoplasty for her spine compression fractures could be considered in the future if she were to have significant pain. At present patient is fairly asymptomatic and hence I have referred her to Dr. Baruch Gouty from radiation oncology who will evaluate her today for palliative radiation. Further systemic therapy would be on hold until radiation is completed.  2. Given that she has had a mixed response to treatment and progressive worsening of her bony metastases, I will not be giving her further doses of carboplatin and etoposide. I will plan to switch her to FOLFIRI as second line chemotherapy upon completion of radiation. I explained to the patient and her husband that at this time we do not know the source of her primary but it is possibly GI in origin. Topotecan does not have good response in this setting and I am hesitant to use immunotherapy given that PDL 1 expression on her tumor was 0%. I also discussed her case with Dr. Leamon Arnt from [facility] today.  3. We will proceed with the next dose of Zometa today and I will see her back in 10 days' time and plan to start chemotherapy upon completion of radiation   Total face to face encounter time for this patient visit was 45 min. >50% of the time was  spent in counseling and coordination of care.     Visit Diagnosis 1. High grade  neuroendocrine carcinoma ([place])   2. Bone metastases ([place])      Dr. Randa Evens, MD, MPH [facility] at [facility] Pager- [phone] 11/30/2016 12:05 PM

## 2016-11-30 NOTE — Progress Notes (Signed)
Here for follow up-doing well  

## 2016-12-01 ENCOUNTER — Ambulatory Visit
Admission: RE | Admit: 2016-12-01 | Discharge: 2016-12-01 | Disposition: A | Discharge status: Home / Self Care | Payer: Medicare Other | Source: Ambulatory Visit | Attending: Radiation Oncology | Admitting: Radiation Oncology

## 2016-12-01 ENCOUNTER — Inpatient Hospital Stay: Payer: Medicare Other

## 2016-12-01 DIAGNOSIS — C7951 Secondary malignant neoplasm of bone: Secondary | ICD-10-CM | POA: Diagnosis not present

## 2016-12-01 LAB — CEA: CEA: 10.9 ng/mL — ABNORMAL HIGH (ref 0.0–4.7)

## 2016-12-02 ENCOUNTER — Inpatient Hospital Stay: Payer: Medicare Other

## 2016-12-03 ENCOUNTER — Other Ambulatory Visit: Payer: Self-pay | Admitting: *Deleted

## 2016-12-03 DIAGNOSIS — C7951 Secondary malignant neoplasm of bone: Secondary | ICD-10-CM

## 2016-12-06 DIAGNOSIS — C7951 Secondary malignant neoplasm of bone: Secondary | ICD-10-CM | POA: Diagnosis not present

## 2016-12-08 ENCOUNTER — Other Ambulatory Visit: Payer: Self-pay | Admitting: Oncology

## 2016-12-09 ENCOUNTER — Ambulatory Visit: Payer: Medicare Other

## 2016-12-09 ENCOUNTER — Ambulatory Visit
Admission: RE | Admit: 2016-12-09 | Discharge: 2016-12-09 | Disposition: A | Discharge status: Home / Self Care | Payer: Medicare Other | Source: Ambulatory Visit | Attending: Radiation Oncology | Admitting: Radiation Oncology

## 2016-12-09 DIAGNOSIS — C7951 Secondary malignant neoplasm of bone: Secondary | ICD-10-CM | POA: Diagnosis not present

## 2016-12-10 ENCOUNTER — Inpatient Hospital Stay (HOSPITAL_BASED_OUTPATIENT_CLINIC_OR_DEPARTMENT_OTHER): Payer: Medicare Other | Admitting: Oncology

## 2016-12-10 ENCOUNTER — Other Ambulatory Visit: Payer: Self-pay | Admitting: *Deleted

## 2016-12-10 ENCOUNTER — Inpatient Hospital Stay: Payer: Medicare Other | Attending: Oncology

## 2016-12-10 ENCOUNTER — Telehealth: Payer: Self-pay | Admitting: *Deleted

## 2016-12-10 VITALS — BP 116/76 | HR 98 | Temp 100.2°F | Resp 18 | Wt 147.0 lb

## 2016-12-10 DIAGNOSIS — C7951 Secondary malignant neoplasm of bone: Secondary | ICD-10-CM | POA: Diagnosis not present

## 2016-12-10 DIAGNOSIS — Z87442 Personal history of urinary calculi: Secondary | ICD-10-CM

## 2016-12-10 DIAGNOSIS — Z800 Family history of malignant neoplasm of digestive organs: Secondary | ICD-10-CM | POA: Diagnosis not present

## 2016-12-10 DIAGNOSIS — C778 Secondary and unspecified malignant neoplasm of lymph nodes of multiple regions: Secondary | ICD-10-CM

## 2016-12-10 DIAGNOSIS — R5383 Other fatigue: Secondary | ICD-10-CM | POA: Diagnosis not present

## 2016-12-10 DIAGNOSIS — K640 First degree hemorrhoids: Secondary | ICD-10-CM | POA: Diagnosis not present

## 2016-12-10 DIAGNOSIS — I10 Essential (primary) hypertension: Secondary | ICD-10-CM

## 2016-12-10 DIAGNOSIS — Z79899 Other long term (current) drug therapy: Secondary | ICD-10-CM

## 2016-12-10 DIAGNOSIS — E782 Mixed hyperlipidemia: Secondary | ICD-10-CM

## 2016-12-10 DIAGNOSIS — C7A1 Malignant poorly differentiated neuroendocrine tumors: Secondary | ICD-10-CM

## 2016-12-10 DIAGNOSIS — C7800 Secondary malignant neoplasm of unspecified lung: Secondary | ICD-10-CM | POA: Diagnosis not present

## 2016-12-10 DIAGNOSIS — M8580 Other specified disorders of bone density and structure, unspecified site: Secondary | ICD-10-CM | POA: Diagnosis not present

## 2016-12-10 DIAGNOSIS — C787 Secondary malignant neoplasm of liver and intrahepatic bile duct: Secondary | ICD-10-CM

## 2016-12-10 DIAGNOSIS — Z923 Personal history of irradiation: Secondary | ICD-10-CM

## 2016-12-10 DIAGNOSIS — Z9221 Personal history of antineoplastic chemotherapy: Secondary | ICD-10-CM | POA: Diagnosis not present

## 2016-12-10 DIAGNOSIS — C7A026 Malignant carcinoid tumor of the rectum: Secondary | ICD-10-CM | POA: Diagnosis not present

## 2016-12-10 DIAGNOSIS — M549 Dorsalgia, unspecified: Secondary | ICD-10-CM | POA: Insufficient documentation

## 2016-12-10 LAB — COMPREHENSIVE METABOLIC PANEL
ALT: 33 U/L (ref 14–54)
AST: 31 U/L (ref 15–41)
Albumin: 3.8 g/dL (ref 3.5–5.0)
Alkaline Phosphatase: 243 U/L — ABNORMAL HIGH (ref 38–126)
Anion gap: 12 (ref 5–15)
BUN: 27 mg/dL — ABNORMAL HIGH (ref 6–20)
CHLORIDE: 107 mmol/L (ref 101–111)
CO2: 20 mmol/L — AB (ref 22–32)
CREATININE: 0.87 mg/dL (ref 0.44–1.00)
Calcium: 9.4 mg/dL (ref 8.9–10.3)
GFR calc non Af Amer: >60 mL/min (ref >60)
Glucose, Bld: 172 mg/dL — ABNORMAL HIGH (ref 65–99)
Potassium: 3.5 mmol/L (ref 3.5–5.1)
SODIUM: 139 mmol/L (ref 135–145)
Total Bilirubin: 0.6 mg/dL (ref 0.3–1.2)
Total Protein: 7.6 g/dL (ref 6.5–8.1)

## 2016-12-10 LAB — CBC WITH DIFFERENTIAL/PLATELET
BASOS PCT: 2 %
Basophils Absolute: 0.3 10*3/uL — ABNORMAL HIGH (ref 0–0.1)
EOS ABS: 0.2 10*3/uL (ref 0–0.7)
EOS PCT: 1 %
HCT: 33.9 % — ABNORMAL LOW (ref 35.0–47.0)
HEMOGLOBIN: 11.5 g/dL — AB (ref 12.0–16.0)
Lymphocytes Relative: 7 %
Lymphs Abs: 1.3 10*3/uL (ref 1.0–3.6)
MCH: 27.5 pg (ref 26.0–34.0)
MCHC: 33.9 g/dL (ref 32.0–36.0)
MCV: 81 fL (ref 80.0–100.0)
Monocytes Absolute: 1.5 10*3/uL — ABNORMAL HIGH (ref 0.2–0.9)
Monocytes Relative: 8 %
NEUTROS PCT: 82 %
Neutro Abs: 15.6 10*3/uL — ABNORMAL HIGH (ref 1.4–6.5)
PLATELETS: 130 10*3/uL — AB (ref 150–440)
RBC: 4.19 MIL/uL (ref 3.80–5.20)
RDW: 20.2 % — ABNORMAL HIGH (ref 11.5–14.5)
WBC: 18.9 10*3/uL — AB (ref 3.6–11.0)

## 2016-12-10 NOTE — Progress Notes (Signed)
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 patient is a 66 year old female follows up with [facility] GI. He has a history of villous adenoma of the rectum in 2005 status post excision. Subsequent colonoscopies have been negative for malignancy.Colonoscopy on 05/12/2015 which showed sessile polyps in the sigmoid colon and hepatic flexure along with grade 1 internal hemorrhoids.   2. She had been having some right-sided abdominal pain and was seen in urgent carein December 2068fr what was thought to be pain from her kidney stones. She was seen by PCP and urology and the kidney stones were thought to be small and nonobstructing and did not feel that this is the cause for abdominal pain   3. She underwent ultrasound of the abdomen for further evaluation on 09/23/2016 which showed a solid mass in the right lobe of the liver measuring 4.8 x 5 x 5 cm. This was followed by a CT abdomen the same day which showed a hypoattenuating/hypoenhancing liver lesions which were new since 2010. Index posterior right hepatic lobe lesion was 4.9 x 4.7 cm,lesion straddling the anterior right and medial left hepatic lobe measures 3.5 x 5.2 cm. Weight left-sided lesions up to 1.2 cm. Suspicion of asymmetric wall thickening in the posterior left side of the rectum. Borderline to mild left inguinal adenopathy of 1 cm and perirectal adenopathy up to 1.8 cm suspicious presacral node measuring 8 mm. New multifocal nauseous lesions including within the inferior aspect of  the L3 vertebral body. Bibasilar pulmonary nodules suspicious for malignancy  4. Patient had a repeat colonoscopy on 09/27/2016 which showed many polyps in the rectum and the rectosigmoid colon which were biopsied. Internal hemorrhoids. Biopsy showed hyperplastic polyps negative for dysplasia and malignancy  5. PET CT scan showed widespread metastatic disease with hypermetabolism in the mediastinal and left hilar lymph nodes. Multifocal pulmonary nodules compatible with thoracic metastases. Extensive liver metastases as well as upper abdominal and pelvic lymph node metastases. Widespread nauseous metastatic disease and at least one pathologic fracture involving the L2 vertebra.  6. Ultrasound-guided biopsy of the liver showed high-grade neuroendocrine carcinoma with Ki-67 of 80%. CD 56 positive and synatophysindiffuse moderate staining.  7. Patient received cycle 1 of chemotherapy carboplatin/etoposidefrom 10/18/2016 to 10/20/2016  8. Patient received 1st dose of zometa on 11/03/16  9. Foundation one testing was done which showed: PDL1 expression was 0%. 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 MSI stable.   10. [facility] and etoposide first-line  11. After 2 cycles of chemotherapy she had interim scans which showed response in her liver as well as intra-abdominal adenopathy but worsening bone lesions. There were significant compression fractures throughout her spine as well as impending fractures. She was seen by orthopedic surgery and there was no acute role for surgical management. She was referred to radiation oncology and started her radiation on 12/09/2016  Interval history- currently patient states that her pain is well controlled with pain medications. However she has not been able to come off her steroids. She does  feel quite fatigued and thinks that her back pain is worse when she comes off steroids. She was  noted to have a low-grade fever of 100.2 today. Reports some soreness in her throat and has been taking nystatin for thrush. It responds whenever she takes it but tends to come back.  ECOG PS- 2   Review of systems- Review of Systems  Constitutional: Negative for chills, fever, malaise/fatigue and weight loss.  HENT: Negative for congestion, ear discharge and nosebleeds.   Eyes: Negative for blurred vision.  Respiratory: Negative for cough, hemoptysis, sputum production, shortness of breath and wheezing.   Cardiovascular: Negative for chest pain, palpitations, orthopnea and claudication.  Gastrointestinal: Negative for abdominal pain, blood in stool, constipation, diarrhea, heartburn, melena, nausea and vomiting.  Genitourinary: Negative for dysuria, flank pain, frequency, hematuria and urgency.  Musculoskeletal: Positive for back pain. Negative for joint pain and myalgias.  Skin: Negative for rash.  Neurological: Negative for dizziness, tingling, focal weakness, seizures, weakness and headaches.  Endo/Heme/Allergies: Does not bruise/bleed easily.  Psychiatric/Behavioral: Negative for depression and suicidal ideas. The patient does not have insomnia.       Allergies  Allergen Reactions  . Codeine Nausea And Vomiting     Past Medical History:  Diagnosis Date  . Allergic rhinitis   . Anxiety   . Asthma   . Attention deficit disorder without mention of hyperactivity   . Calculus of kidney   . Cataract   . Diffuse cystic mastopathy   . High cholesterol   . History of kidney stones   . Hypoglycemia, unspecified   . Migraine, unspecified, without mention of intractable migraine without mention of status migrainosus   . Mixed hyperlipidemia   . Osteopenia   . Precancerous lesion   . Unspecified essential hypertension      Past Surgical History:  Procedure Laterality Date  . ABDOMINAL HYSTERECTOMY     10/2002  . APPENDECTOMY    . COLONOSCOPY  5/06  . COLONOSCOPY WITH  PROPOFOL N/A 05/12/2015   Procedure: COLONOSCOPY WITH PROPOFOL;  Surgeon: Manya Silvas, MD;  Location: [facility] ENDOSCOPY;  Service: Endoscopy;  Laterality: N/A;  . COLONOSCOPY WITH PROPOFOL N/A 09/27/2016   Procedure: COLONOSCOPY WITH PROPOFOL;  Surgeon: Manya Silvas, MD;  Location: [facility] ENDOSCOPY;  Service: Endoscopy;  Laterality: N/A;  . EYE SURGERY     multiple; s/p childhood trauma  . LITHOTRIPSY    . 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 PERIPHERAL VASCULAR CATHETERIZATION N/A 11/04/2016   Procedure: Glori Luis Cath Insertion;  Surgeon: Algernon Huxley, MD;  Location: [facility];  Service: Cardiovascular;  Laterality: N/A;  . TONSILLECTOMY      Social History   Social History  . Marital status: Married    Spouse name: N/A  . Number of children: 2  . Years of education: N/A   Occupational History  . Retired    Social History Main Topics  . Smoking status: Never Smoker  . Smokeless tobacco: Never Used  . Alcohol use 0.6 oz/week    1 Shots of liquor per week     Comment: occasional last  month  . Drug use: No  . Sexual activity: No   Other Topics Concern  . 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 Diabetes Father   . Colon cancer Father   . Hypertension Father   . Heart attack Father   . Heart attack Mother   . Coronary artery disease Mother   . Pancreatic cancer Mother   . Hyperlipidemia Mother   . Other Brother     Congenital hyperspadias  . Other Brother     loss of kidney function in 1 kidney early in life  . Diabetes      Grandmother  . Coronary artery disease Maternal Grandmother   . Coronary artery disease      Maternal aunts and uncles  . Breast cancer Neg Hx   . Ovarian cancer Neg Hx   . Uterine cancer Neg Hx      Current Outpatient Prescriptions:  .   amLODipine (NORVASC) 5 MG tablet, Take 1 tablet (5 mg total) by mouth daily., Disp: 90 tablet, Rfl: 3 .  cetirizine (ZYRTEC) 10 MG tablet, Take 10 mg by mouth daily.  , Disp: , Rfl:  .  cholecalciferol (VITAMIN D) 1000 UNITS tablet, Take 1,000 Units by mouth daily.  , Disp: , Rfl:  .  Coenzyme Q10 (COQ10) 400 MG CAPS, Take 400 mg by mouth daily. , Disp: , Rfl:  .  dexamethasone (DECADRON) 2 MG tablet, Take 1 tablet (2 mg total) by mouth daily. (Patient taking differently: Take 1 mg by mouth daily. ), Disp: 30 tablet, Rfl: 0 .  Flaxseed, Linseed, (FLAX SEED OIL) 1000 MG CAPS, Take 1 capsule by mouth daily., Disp: , Rfl:  .  fluticasone (FLONASE) 50 MCG/ACT nasal spray, Place 2 sprays into both nostrils daily., Disp: 48 g, Rfl: 3 .  HYDROcodone-acetaminophen (NORCO/VICODIN) 5-325 MG tablet, Take 1-2 tablets by mouth every 6 (six) hours as needed., Disp: 40 tablet, Rfl: 0 .  lidocaine (LIDODERM) 5 %, Place 1 patch onto the skin daily. Remove & Discard patch within 12 hours of use each day, Disp: 30 patch, Rfl: 0 .  lidocaine-prilocaine (EMLA) cream, Apply 1 application topically as needed. Apply to port 2 hours prior to appt, Disp: 30 g, Rfl: 2 .  LUMIGAN 0.01 % SOLN, Place 1 drop into the right eye at bedtime., Disp: , Rfl: 0 .  morphine (MS CONTIN) 15 MG 12 hr tablet, Take 15 mg by mouth every 12 (twelve) hours., Disp: , Rfl: 0 .  morphine (MSIR) 15 MG tablet, take 1 tablet by mouth every 4 hours if needed for severe pain, Disp: , Rfl: 0 .  Multiple Vitamin (MULTIVITAMIN) tablet, Take 1 tablet by mouth daily.  , Disp: , Rfl:  .  naloxone (NARCAN) nasal spray 4 mg/0.1 mL, Place 1 spray into the nose daily as needed (opioid overdose)., Disp: , Rfl:  .  nystatin (MYCOSTATIN) 100000 UNIT/ML suspension, take 5 milliliters by mouth four times a day, Disp: 120 mL, Rfl: 1 .  Omega-3 Fatty Acids (FISH OIL) 1200 MG CAPS, Take 1,200 mg by mouth daily. , Disp: , Rfl:  .  ondansetron (ZOFRAN) 4 MG tablet, take 1  tablet by mouth every 6 hours if needed for nausea and vomiting, Disp: , Rfl: 0 .  ondansetron (ZOFRAN-ODT) 4 MG disintegrating tablet, Take 4 mg by mouth every 8 (eight) hours as needed for nausea or vomiting. , Disp: , Rfl:  .  Oxycodone HCl 10 MG TABS, take 9m by mouth twice a day if needed for pain, Disp: , Rfl: 0 .  Probiotic Product (PROBIOTIC  DAILY PO), Take 1 capsule by mouth daily., Disp: , Rfl:  .  rosuvastatin (CRESTOR) 20 MG tablet, Take one and one half tablets by mouth once daily for cholesterol (Patient taking differently: Take 30 mg by mouth daily. ), Disp: 135 tablet, Rfl: 3 .  venlafaxine XR (EFFEXOR-XR) 75 MG 24 hr capsule, take 1 capsule by mouth once daily, Disp: 90 capsule, Rfl: 3 .  nitrofurantoin, macrocrystal-monohydrate, (MACROBID) 100 MG capsule, Take 1 capsule (100 mg total) by mouth 2 (two) times daily. (Patient not taking: Reported on 11/09/2016), Disp: 14 capsule, Rfl: 0 .  prochlorperazine (COMPAZINE) 10 MG tablet, Take 1 tablet (10 mg total) by mouth every 6 (six) hours as needed for nausea or vomiting. (Patient not taking: Reported on 12/10/2016), Disp: 30 tablet, Rfl: 0  Physical exam:  Vitals:   12/10/16 1024  BP: 116/76  Pulse: 98  Resp: 18  Temp: 100.2 F (37.9 C)  TempSrc: Tympanic  Weight: 147 lb 0.8 oz (66.7 kg)   Physical Exam  Constitutional: She is oriented to person, place, and time and well-developed, well-nourished, and in no distress.  She is ambulating with a cane  HENT:  Head: Normocephalic and atraumatic.  No evidence of thrush  Eyes: EOM are normal. Pupils are equal, round, and reactive to light.  Neck: Normal range of motion.  Cardiovascular: Normal rate, regular rhythm and normal heart sounds.   Pulmonary/Chest: Effort normal and breath sounds normal.  Abdominal: Soft. Bowel sounds are normal.  Neurological: She is alert and oriented to person, place, and time.  Skin: Skin is warm and dry.     CMP Latest Ref Rng & Units 12/10/2016    Glucose 65 - 99 mg/dL 172(H)  BUN 6 - 20 mg/dL 27(H)  Creatinine 0.44 - 1.00 mg/dL 0.87  Sodium 135 - 145 mmol/L 139  Potassium 3.5 - 5.1 mmol/L 3.5  Chloride 101 - 111 mmol/L 107  CO2 22 - 32 mmol/L 20(L)  Calcium 8.9 - 10.3 mg/dL 9.4  Total Protein 6.5 - 8.1 g/dL 7.6  Total Bilirubin 0.3 - 1.2 mg/dL 0.6  Alkaline Phos 38 - 126 U/L 243(H)  AST 15 - 41 U/L 31  ALT 14 - 54 U/L 33   CBC Latest Ref Rng & Units 12/10/2016  WBC 3.6 - 11.0 K/uL 18.9(H)  Hemoglobin 12.0 - 16.0 g/dL 11.5(L)  Hematocrit 35.0 - 47.0 % 33.9(L)  Platelets 150 - 440 K/uL 130(L)    No images are attached to the encounter.  Ct Chest W Contrast  Result Date: 11/29/2016 CLINICAL DATA:  Neuroendocrine cancer.  Status post chemotherapy. EXAM: CT CHEST, ABDOMEN, AND PELVIS WITH CONTRAST TECHNIQUE: Multidetector CT imaging of the chest, abdomen and pelvis was performed following the standard protocol during bolus administration of intravenous contrast. CONTRAST:  125m ISOVUE-300 IOPAMIDOL (ISOVUE-300) INJECTION 61% COMPARISON:  12/22/ 17 FINDINGS: CT CHEST FINDINGS Cardiovascular: The heart size appears normal. There is no pericardial effusion identified. Mediastinum/Nodes: No enlarged mediastinal, hilar, or axillary lymph nodes. Index AP window lymph node measures 8 mm, image 24 of series 2. Previously 9 mm. Thyroid gland, trachea, and esophagus demonstrate no significant findings. Lungs/Pleura: No pleural fluid. No airspace opacities identified. Scattered pulmonary nodules are again identified. 2 mm subpleural nodule within the posterior left lower lobe is identified, image 103 of series 4. Index nodule within the left upper lobe measures 5 mm, image 73 of series 4. Previously 8 mm. Perifissural nodule within the lingula measures 3 mm and is unchanged from previous  exam. There is a nodule in the right lower lobe which is stable measuring 4 mm, image 111 of series 4. Musculoskeletal: Multifocal lytic bone metastases are  identified. This demonstrates significant interval progression when compared with previous exam. New pathologic fractures involves T8 and T9. CT ABDOMEN PELVIS FINDINGS Hepatobiliary: The index lesion within posterior right lobe of liver measures 3.6 cm, image 60 of series 2. This is decreased from 5.7 cm previously. Index lesion within the anterior right lobe of liver measures 4.9 x 2.7 cm, image 59 of series 2. Previously 5.2 x 3.5 cm. Index lesion within segment 5 of the right lobe measures 3.3 cm, image 70 of series 2. Previously 3.5 cm. Pancreas: Unremarkable. No pancreatic ductal dilatation or surrounding inflammatory changes. Spleen: Normal in size without focal abnormality. Adrenals/Urinary Tract: The adrenal glands are normal. Bilateral renal calculi are identified. No hydronephrosis or mass. The urinary bladder appears normal. Stomach/Bowel: Stomach is within normal limits. No evidence of bowel wall thickening, distention, or inflammatory changes. Vascular/Lymphatic: No significant vascular findings are present. No enlarged abdominal or pelvic lymph nodes. Reproductive: Status post hysterectomy. No adnexal masses. Other: No abdominal wall hernia or abnormality. No abdominopelvic ascites. Musculoskeletal: Interval progression of multifocal lytic bone metastases. There are progressive lytic lesions involving bilateral femoral necks which may be of orthopedic significance, image number 116 of series 2 and image 119 of series 2. New pathologic fractures are noted involving lumbar vertebra including and at the L2 level. L3 inferior endplate fracture is noted and there are also fractures involving L5. IMPRESSION: 1. Interval mixed response to therapy. 2. Significant interval progression of multifocal lytic bone metastases. There are multiple new compression fractures involving the thoracic and lumbar spine. Additionally, there are bilateral femoral neck fractures which may be of impending orthopedic  significance. 3. Improvement and liver metastases. 4. Stable to improved appearance of pulmonary metastasis. These results will be called to the ordering clinician or representative by the Radiologist Assistant, and communication documented in the PACS or zVision Dashboard. Electronically Signed   By: Kerby Moors M.D.   On: 11/29/2016 10:27   Ct Abdomen Pelvis W Contrast  Result Date: 11/29/2016 CLINICAL DATA:  Neuroendocrine cancer.  Status post chemotherapy. EXAM: CT CHEST, ABDOMEN, AND PELVIS WITH CONTRAST TECHNIQUE: Multidetector CT imaging of the chest, abdomen and pelvis was performed following the standard protocol during bolus administration of intravenous contrast. CONTRAST:  181m ISOVUE-300 IOPAMIDOL (ISOVUE-300) INJECTION 61% COMPARISON:  12/22/ 17 FINDINGS: CT CHEST FINDINGS Cardiovascular: The heart size appears normal. There is no pericardial effusion identified. Mediastinum/Nodes: No enlarged mediastinal, hilar, or axillary lymph nodes. Index AP window lymph node measures 8 mm, image 24 of series 2. Previously 9 mm. Thyroid gland, trachea, and esophagus demonstrate no significant findings. Lungs/Pleura: No pleural fluid. No airspace opacities identified. Scattered pulmonary nodules are again identified. 2 mm subpleural nodule within the posterior left lower lobe is identified, image 103 of series 4. Index nodule within the left upper lobe measures 5 mm, image 73 of series 4. Previously 8 mm. Perifissural nodule within the lingula measures 3 mm and is unchanged from previous exam. There is a nodule in the right lower lobe which is stable measuring 4 mm, image 111 of series 4. Musculoskeletal: Multifocal lytic bone metastases are identified. This demonstrates significant interval progression when compared with previous exam. New pathologic fractures involves T8 and T9. CT ABDOMEN PELVIS FINDINGS Hepatobiliary: The index lesion within posterior right lobe of liver measures 3.6 cm, image 60 of  series 2. This is decreased from 5.7 cm previously. Index lesion within the anterior right lobe of liver measures 4.9 x 2.7 cm, image 59 of series 2. Previously 5.2 x 3.5 cm. Index lesion within segment 5 of the right lobe measures 3.3 cm, image 70 of series 2. Previously 3.5 cm. Pancreas: Unremarkable. No pancreatic ductal dilatation or surrounding inflammatory changes. Spleen: Normal in size without focal abnormality. Adrenals/Urinary Tract: The adrenal glands are normal. Bilateral renal calculi are identified. No hydronephrosis or mass. The urinary bladder appears normal. Stomach/Bowel: Stomach is within normal limits. No evidence of bowel wall thickening, distention, or inflammatory changes. Vascular/Lymphatic: No significant vascular findings are present. No enlarged abdominal or pelvic lymph nodes. Reproductive: Status post hysterectomy. No adnexal masses. Other: No abdominal wall hernia or abnormality. No abdominopelvic ascites. Musculoskeletal: Interval progression of multifocal lytic bone metastases. There are progressive lytic lesions involving bilateral femoral necks which may be of orthopedic significance, image number 116 of series 2 and image 119 of series 2. New pathologic fractures are noted involving lumbar vertebra including and at the L2 level. L3 inferior endplate fracture is noted and there are also fractures involving L5. IMPRESSION: 1. Interval mixed response to therapy. 2. Significant interval progression of multifocal lytic bone metastases. There are multiple new compression fractures involving the thoracic and lumbar spine. Additionally, there are bilateral femoral neck fractures which may be of impending orthopedic significance. 3. Improvement and liver metastases. 4. Stable to improved appearance of pulmonary metastasis. These results will be called to the ordering clinician or representative by the Radiologist Assistant, and communication documented in the PACS or zVision Dashboard.  Electronically Signed   By: Kerby Moors M.D.   On: 11/29/2016 10:27     Assessment and plan- Patient is a 66 y.o. female with poorly differentiated high-grade neuroendocrine tumor likely GI primary status post mixed response to 2 cycles of carboplatin and etoposide.  At this point patient will proceed with her palliative radiation which will be for a total of 3 weeks ending on 01/03/2017. Patient has also been seen by Dr. Leamon Arnt a couple of days ago to see if she would be a candidate for any clinical trial. She will proceed with treatment on clinical trial should she be deemed eligible for the same. Otherwise I will see her back on 01/06/2017 and plan to start second line palliative chemotherapy with FOLFIRI at that time. I will discuss the details of chemotherapy during that visit. Patient will continue to get Zometa every 4 weeks.  Patient was found to have a temperature of 100.2 today but she is not overtly febrile. There are no signs and symptoms of any localizing infection. I will hold off on starting any antibiotics at this time. Should she have a fever more than 101 she will give [place] a call.   Visit Diagnosis 1. High grade neuroendocrine carcinoma ([place])   2. Bone metastases ([place])      Dr. Randa Evens, MD, MPH [facility] at [facility] Pager- [phone] 12/10/2016 1:32 PM

## 2016-12-10 NOTE — Progress Notes (Signed)
Patient c/o back pain.  States she has one area that is sore in her throat.  Temp 100.2.

## 2016-12-10 NOTE — Telephone Encounter (Signed)
Called pt to let her know that she needs next dose of zometa 3/20 and I asked her if it is ok for her to come sooner than her radiation appt and she was agreeable I told her that I feel that she should come at 10 am because she will need to have labs and then it takes 45 min to get labs back and then get the treatment. She was agreeable to the time and she will look in my chart to see it. I sent in basket to add her on.

## 2016-12-12 ENCOUNTER — Other Ambulatory Visit: Payer: Self-pay | Admitting: *Deleted

## 2016-12-12 DIAGNOSIS — C7951 Secondary malignant neoplasm of bone: Secondary | ICD-10-CM

## 2016-12-12 DIAGNOSIS — C7A1 Malignant poorly differentiated neuroendocrine tumors: Secondary | ICD-10-CM

## 2016-12-13 ENCOUNTER — Ambulatory Visit
Admission: RE | Admit: 2016-12-13 | Discharge: 2016-12-13 | Disposition: A | Discharge status: Home / Self Care | Payer: Medicare Other | Source: Ambulatory Visit | Attending: Radiation Oncology | Admitting: Radiation Oncology

## 2016-12-13 DIAGNOSIS — C7951 Secondary malignant neoplasm of bone: Secondary | ICD-10-CM | POA: Diagnosis not present

## 2016-12-14 ENCOUNTER — Ambulatory Visit
Admission: RE | Admit: 2016-12-14 | Discharge: 2016-12-14 | Disposition: A | Discharge status: Home / Self Care | Payer: Medicare Other | Source: Ambulatory Visit | Attending: Radiation Oncology | Admitting: Radiation Oncology

## 2016-12-14 DIAGNOSIS — C7951 Secondary malignant neoplasm of bone: Secondary | ICD-10-CM | POA: Diagnosis not present

## 2016-12-15 ENCOUNTER — Ambulatory Visit
Admission: RE | Admit: 2016-12-15 | Discharge: 2016-12-15 | Disposition: A | Discharge status: Home / Self Care | Payer: Medicare Other | Source: Ambulatory Visit | Attending: Radiation Oncology | Admitting: Radiation Oncology

## 2016-12-15 DIAGNOSIS — C7951 Secondary malignant neoplasm of bone: Secondary | ICD-10-CM | POA: Diagnosis not present

## 2016-12-16 ENCOUNTER — Ambulatory Visit
Admission: RE | Admit: 2016-12-16 | Discharge: 2016-12-16 | Disposition: A | Discharge status: Home / Self Care | Payer: Medicare Other | Source: Ambulatory Visit | Attending: Radiation Oncology | Admitting: Radiation Oncology

## 2016-12-16 DIAGNOSIS — C7951 Secondary malignant neoplasm of bone: Secondary | ICD-10-CM | POA: Diagnosis not present

## 2016-12-17 ENCOUNTER — Ambulatory Visit
Admission: RE | Admit: 2016-12-17 | Discharge: 2016-12-17 | Disposition: A | Discharge status: Home / Self Care | Payer: Medicare Other | Source: Ambulatory Visit | Attending: Radiation Oncology | Admitting: Radiation Oncology

## 2016-12-17 DIAGNOSIS — C7951 Secondary malignant neoplasm of bone: Secondary | ICD-10-CM | POA: Diagnosis not present

## 2016-12-20 ENCOUNTER — Ambulatory Visit
Admission: RE | Admit: 2016-12-20 | Discharge: 2016-12-20 | Disposition: A | Discharge status: Home / Self Care | Payer: Medicare Other | Source: Ambulatory Visit | Attending: Radiation Oncology | Admitting: Radiation Oncology

## 2016-12-20 DIAGNOSIS — C7951 Secondary malignant neoplasm of bone: Secondary | ICD-10-CM | POA: Diagnosis not present

## 2016-12-20 NOTE — Addendum Note (Signed)
Encounter addended by: Vernie Murders, RT on: 12/20/2016 10:25 AM<BR>    Actions taken: Imaging Exam ended, Charge Capture section accepted

## 2016-12-21 ENCOUNTER — Ambulatory Visit
Admission: RE | Admit: 2016-12-21 | Discharge: 2016-12-21 | Disposition: A | Discharge status: Home / Self Care | Payer: Medicare Other | Source: Ambulatory Visit | Attending: Radiation Oncology | Admitting: Radiation Oncology

## 2016-12-21 ENCOUNTER — Inpatient Hospital Stay: Payer: Medicare Other | Admitting: *Deleted

## 2016-12-21 ENCOUNTER — Other Ambulatory Visit: Payer: Self-pay | Admitting: *Deleted

## 2016-12-21 ENCOUNTER — Telehealth: Payer: Self-pay | Admitting: *Deleted

## 2016-12-21 DIAGNOSIS — C7951 Secondary malignant neoplasm of bone: Secondary | ICD-10-CM

## 2016-12-21 DIAGNOSIS — C7A026 Malignant carcinoid tumor of the rectum: Secondary | ICD-10-CM | POA: Diagnosis not present

## 2016-12-21 DIAGNOSIS — C7A1 Malignant poorly differentiated neuroendocrine tumors: Secondary | ICD-10-CM

## 2016-12-21 LAB — BASIC METABOLIC PANEL
ANION GAP: 10 (ref 5–15)
BUN: 26 mg/dL — ABNORMAL HIGH (ref 6–20)
CALCIUM: 9.6 mg/dL (ref 8.9–10.3)
CO2: 23 mmol/L (ref 22–32)
Chloride: 103 mmol/L (ref 101–111)
Creatinine, Ser: 0.8 mg/dL (ref 0.44–1.00)
Glucose, Bld: 159 mg/dL — ABNORMAL HIGH (ref 65–99)
POTASSIUM: 4.5 mmol/L (ref 3.5–5.1)
Sodium: 136 mmol/L (ref 135–145)

## 2016-12-21 LAB — CBC
HCT: 33 % — ABNORMAL LOW (ref 35.0–47.0)
Hemoglobin: 11.2 g/dL — ABNORMAL LOW (ref 12.0–16.0)
MCH: 28.1 pg (ref 26.0–34.0)
MCHC: 33.9 g/dL (ref 32.0–36.0)
MCV: 82.9 fL (ref 80.0–100.0)
PLATELETS: 147 10*3/uL — AB (ref 150–440)
RBC: 3.98 MIL/uL (ref 3.80–5.20)
RDW: 22 % — AB (ref 11.5–14.5)
WBC: 11 10*3/uL (ref 3.6–11.0)

## 2016-12-21 MED ORDER — CYCLOBENZAPRINE HCL 5 MG PO TABS: 5.0000 mg | ORAL_TABLET | Freq: Three times a day (TID) | ORAL | 0 refills | Status: AC | PRN

## 2016-12-21 NOTE — Telephone Encounter (Signed)
Kim from radiation called to tell me that pt having HA and they have progressively got worse. She states that pt describes it as starting in back right above shoulders and goes up her neck.  She had taken flexeril for it and her husband thought she was taking too much and hid them and forgot what he did with them. I checked with dr. Janese Banks and she said it is ok to fill 5 mg of flexeril and tid as needed. I called and spoke to pt and she states the HA got worse last couple of nights and mostly bothers her when she lays down in the bed and the pain is worse and it is hard for her to sleep. Last night she put lidoderm patch on and finally calmed it down to sleep. She states that she would like to use flexeril and then put patch on and see if she can have better sleep if she gets pain relief.  She is fearful of mets to her brain. I told her that I would think it is from her bone mets because the way she describes it that it starts in her back and goes up to her Head and sometimes to back of ear. So the origin of pain starts in back and moves to the head. It was good that patch relieved some of the pain. She will try the muscle relaxer and use patch and if pain does not improve then she will call me back and let me know. I sent electronic message to her pharmacy.

## 2016-12-22 ENCOUNTER — Ambulatory Visit
Admission: RE | Admit: 2016-12-22 | Discharge: 2016-12-22 | Disposition: A | Discharge status: Home / Self Care | Payer: Medicare Other | Source: Ambulatory Visit | Attending: Radiation Oncology | Admitting: Radiation Oncology

## 2016-12-22 DIAGNOSIS — C7951 Secondary malignant neoplasm of bone: Secondary | ICD-10-CM | POA: Diagnosis not present

## 2016-12-23 ENCOUNTER — Other Ambulatory Visit: Payer: Self-pay | Admitting: Oncology

## 2016-12-23 ENCOUNTER — Ambulatory Visit
Admission: RE | Admit: 2016-12-23 | Discharge: 2016-12-23 | Disposition: A | Discharge status: Home / Self Care | Payer: Medicare Other | Source: Ambulatory Visit | Attending: Radiation Oncology | Admitting: Radiation Oncology

## 2016-12-23 DIAGNOSIS — C7951 Secondary malignant neoplasm of bone: Secondary | ICD-10-CM | POA: Diagnosis not present

## 2016-12-24 ENCOUNTER — Ambulatory Visit
Admission: RE | Admit: 2016-12-24 | Discharge: 2016-12-24 | Disposition: A | Discharge status: Home / Self Care | Payer: Medicare Other | Source: Ambulatory Visit | Attending: Radiation Oncology | Admitting: Radiation Oncology

## 2016-12-24 DIAGNOSIS — C7951 Secondary malignant neoplasm of bone: Secondary | ICD-10-CM | POA: Diagnosis not present

## 2016-12-27 ENCOUNTER — Ambulatory Visit
Admission: RE | Admit: 2016-12-27 | Discharge: 2016-12-27 | Disposition: A | Discharge status: Home / Self Care | Payer: Medicare Other | Source: Ambulatory Visit | Attending: Radiation Oncology | Admitting: Radiation Oncology

## 2016-12-27 DIAGNOSIS — C7951 Secondary malignant neoplasm of bone: Secondary | ICD-10-CM | POA: Diagnosis not present

## 2016-12-28 ENCOUNTER — Inpatient Hospital Stay: Payer: Medicare Other

## 2016-12-28 ENCOUNTER — Ambulatory Visit
Admission: RE | Admit: 2016-12-28 | Discharge: 2016-12-28 | Disposition: A | Discharge status: Home / Self Care | Payer: Medicare Other | Source: Ambulatory Visit | Attending: Radiation Oncology | Admitting: Radiation Oncology

## 2016-12-28 VITALS — BP 133/81 | HR 98 | Temp 98.0°F | Resp 18

## 2016-12-28 DIAGNOSIS — C7A1 Malignant poorly differentiated neuroendocrine tumors: Secondary | ICD-10-CM

## 2016-12-28 DIAGNOSIS — C7A026 Malignant carcinoid tumor of the rectum: Secondary | ICD-10-CM | POA: Diagnosis not present

## 2016-12-28 DIAGNOSIS — C7951 Secondary malignant neoplasm of bone: Secondary | ICD-10-CM | POA: Diagnosis not present

## 2016-12-28 LAB — BASIC METABOLIC PANEL
ANION GAP: 9 (ref 5–15)
BUN: 25 mg/dL — ABNORMAL HIGH (ref 6–20)
CALCIUM: 9.7 mg/dL (ref 8.9–10.3)
CO2: 23 mmol/L (ref 22–32)
Chloride: 104 mmol/L (ref 101–111)
Creatinine, Ser: 0.88 mg/dL (ref 0.44–1.00)
Glucose, Bld: 142 mg/dL — ABNORMAL HIGH (ref 65–99)
Potassium: 3.9 mmol/L (ref 3.5–5.1)
SODIUM: 136 mmol/L (ref 135–145)

## 2016-12-28 MED ORDER — SODIUM CHLORIDE 0.9 % IV SOLN
Freq: Once | INTRAVENOUS | Status: AC
  Administered 2016-12-28: 12:00:00 via INTRAVENOUS
  Filled 2016-12-28: qty 1000

## 2016-12-28 MED ORDER — SODIUM CHLORIDE 0.9% FLUSH
10.0000 mL | Freq: Once | INTRAVENOUS | Status: AC
  Administered 2016-12-28: 10 mL via INTRAVENOUS
  Filled 2016-12-28: qty 10

## 2016-12-28 MED ORDER — ZOLEDRONIC ACID 4 MG/5ML IV CONC
3.3000 mg | Freq: Once | INTRAVENOUS | Status: AC
  Administered 2016-12-28: 3.3 mg via INTRAVENOUS
  Filled 2016-12-28: qty 4.13

## 2016-12-28 MED ORDER — HEPARIN SOD (PORK) LOCK FLUSH 100 UNIT/ML IV SOLN
500.0000 [IU] | Freq: Once | INTRAVENOUS | Status: AC
  Administered 2016-12-28: 500 [IU] via INTRAVENOUS
  Filled 2016-12-28: qty 5

## 2016-12-29 ENCOUNTER — Ambulatory Visit
Admission: RE | Admit: 2016-12-29 | Discharge: 2016-12-29 | Disposition: A | Discharge status: Home / Self Care | Payer: Medicare Other | Source: Ambulatory Visit | Attending: Radiation Oncology | Admitting: Radiation Oncology

## 2016-12-29 DIAGNOSIS — C7951 Secondary malignant neoplasm of bone: Secondary | ICD-10-CM | POA: Diagnosis not present

## 2016-12-30 ENCOUNTER — Ambulatory Visit
Admission: RE | Admit: 2016-12-30 | Discharge: 2016-12-30 | Disposition: A | Discharge status: Home / Self Care | Payer: Medicare Other | Source: Ambulatory Visit | Attending: Radiation Oncology | Admitting: Radiation Oncology

## 2016-12-30 DIAGNOSIS — C7951 Secondary malignant neoplasm of bone: Secondary | ICD-10-CM | POA: Diagnosis not present

## 2016-12-31 ENCOUNTER — Ambulatory Visit
Admission: RE | Admit: 2016-12-31 | Discharge: 2016-12-31 | Disposition: A | Discharge status: Home / Self Care | Payer: Medicare Other | Source: Ambulatory Visit | Attending: Radiation Oncology | Admitting: Radiation Oncology

## 2016-12-31 DIAGNOSIS — C7951 Secondary malignant neoplasm of bone: Secondary | ICD-10-CM | POA: Diagnosis not present

## 2017-01-03 ENCOUNTER — Ambulatory Visit: Payer: Medicare Other

## 2017-01-04 ENCOUNTER — Ambulatory Visit
Admission: RE | Admit: 2017-01-04 | Discharge: 2017-01-04 | Disposition: A | Discharge status: Home / Self Care | Payer: Medicare Other | Source: Ambulatory Visit | Attending: Radiation Oncology | Admitting: Radiation Oncology

## 2017-01-04 ENCOUNTER — Telehealth: Payer: Self-pay | Admitting: *Deleted

## 2017-01-04 DIAGNOSIS — C7951 Secondary malignant neoplasm of bone: Secondary | ICD-10-CM | POA: Diagnosis not present

## 2017-01-04 NOTE — Telephone Encounter (Signed)
I had called him on Monday and he called after hours and said that it took along time to get block of tumor from [facility] to [facility] and then when [facility] got it they had to have slides so they made slides to send off to another company to see if she would qualify for a clinical trial.  Mr. Whidbee said that he called navigator Friday last week and was told that he does not have any results about test.  I spoke to Mr. Pense this am because he indicated that they would like to move ahead with treatment here of carbo , [id number] and I would see what I could do and that I needed to speak to Dr. Janese Banks.  She said have them come Thursday as sch. And she would get treatment thurs, fri and mon.  I called back and they were ok with that and I got appt changed and then Mr. Petsch texted me to call him and when I called he was not at home but I spoke to wife and she would like to wait til next week and so we will make appt tues, wed, and thurs. I will send sch. Message and have scheduling staff call with new appt. Pt and husband agreeable

## 2017-01-04 NOTE — Telephone Encounter (Signed)
Called pt's husband and let him know that pt can keep the Thursday appt and pt will get chemo this thurs and then she will get it fri and Monday and husband says ok. They already have appt for Thursday this week to start

## 2017-01-06 ENCOUNTER — Inpatient Hospital Stay: Payer: Medicare Other

## 2017-01-06 ENCOUNTER — Inpatient Hospital Stay: Payer: Medicare Other | Admitting: Oncology

## 2017-01-07 ENCOUNTER — Inpatient Hospital Stay: Payer: Medicare Other

## 2017-01-10 ENCOUNTER — Inpatient Hospital Stay: Payer: Medicare Other

## 2017-01-11 ENCOUNTER — Encounter: Payer: Self-pay | Admitting: Oncology

## 2017-01-11 ENCOUNTER — Other Ambulatory Visit: Payer: Self-pay | Admitting: *Deleted

## 2017-01-11 ENCOUNTER — Inpatient Hospital Stay (HOSPITAL_BASED_OUTPATIENT_CLINIC_OR_DEPARTMENT_OTHER): Payer: Medicare Other | Admitting: Oncology

## 2017-01-11 ENCOUNTER — Inpatient Hospital Stay: Payer: Medicare Other | Attending: Oncology

## 2017-01-11 ENCOUNTER — Inpatient Hospital Stay: Payer: Medicare Other

## 2017-01-11 VITALS — HR 105

## 2017-01-11 VITALS — BP 105/73 | HR 121 | Temp 98.8°F | Resp 20 | Wt 149.4 lb

## 2017-01-11 DIAGNOSIS — E782 Mixed hyperlipidemia: Secondary | ICD-10-CM | POA: Insufficient documentation

## 2017-01-11 DIAGNOSIS — R0600 Dyspnea, unspecified: Secondary | ICD-10-CM

## 2017-01-11 DIAGNOSIS — R110 Nausea: Secondary | ICD-10-CM | POA: Insufficient documentation

## 2017-01-11 DIAGNOSIS — Z7689 Persons encountering health services in other specified circumstances: Secondary | ICD-10-CM | POA: Insufficient documentation

## 2017-01-11 DIAGNOSIS — T451X5S Adverse effect of antineoplastic and immunosuppressive drugs, sequela: Secondary | ICD-10-CM | POA: Insufficient documentation

## 2017-01-11 DIAGNOSIS — D6959 Other secondary thrombocytopenia: Secondary | ICD-10-CM | POA: Diagnosis not present

## 2017-01-11 DIAGNOSIS — B370 Candidal stomatitis: Secondary | ICD-10-CM | POA: Diagnosis not present

## 2017-01-11 DIAGNOSIS — C7800 Secondary malignant neoplasm of unspecified lung: Secondary | ICD-10-CM | POA: Diagnosis not present

## 2017-01-11 DIAGNOSIS — R5383 Other fatigue: Secondary | ICD-10-CM | POA: Insufficient documentation

## 2017-01-11 DIAGNOSIS — C7A1 Malignant poorly differentiated neuroendocrine tumors: Secondary | ICD-10-CM

## 2017-01-11 DIAGNOSIS — D638 Anemia in other chronic diseases classified elsewhere: Secondary | ICD-10-CM | POA: Insufficient documentation

## 2017-01-11 DIAGNOSIS — E860 Dehydration: Secondary | ICD-10-CM | POA: Diagnosis not present

## 2017-01-11 DIAGNOSIS — C787 Secondary malignant neoplasm of liver and intrahepatic bile duct: Secondary | ICD-10-CM | POA: Diagnosis not present

## 2017-01-11 DIAGNOSIS — R200 Anesthesia of skin: Secondary | ICD-10-CM | POA: Insufficient documentation

## 2017-01-11 DIAGNOSIS — K640 First degree hemorrhoids: Secondary | ICD-10-CM

## 2017-01-11 DIAGNOSIS — C7951 Secondary malignant neoplasm of bone: Secondary | ICD-10-CM | POA: Diagnosis not present

## 2017-01-11 DIAGNOSIS — Z800 Family history of malignant neoplasm of digestive organs: Secondary | ICD-10-CM

## 2017-01-11 DIAGNOSIS — R531 Weakness: Secondary | ICD-10-CM | POA: Diagnosis not present

## 2017-01-11 DIAGNOSIS — R5381 Other malaise: Secondary | ICD-10-CM | POA: Diagnosis not present

## 2017-01-11 DIAGNOSIS — Z5111 Encounter for antineoplastic chemotherapy: Secondary | ICD-10-CM | POA: Insufficient documentation

## 2017-01-11 DIAGNOSIS — I10 Essential (primary) hypertension: Secondary | ICD-10-CM

## 2017-01-11 DIAGNOSIS — C778 Secondary and unspecified malignant neoplasm of lymph nodes of multiple regions: Secondary | ICD-10-CM | POA: Insufficient documentation

## 2017-01-11 DIAGNOSIS — Z79899 Other long term (current) drug therapy: Secondary | ICD-10-CM

## 2017-01-11 DIAGNOSIS — G893 Neoplasm related pain (acute) (chronic): Secondary | ICD-10-CM | POA: Diagnosis not present

## 2017-01-11 DIAGNOSIS — Z86010 Personal history of colonic polyps: Secondary | ICD-10-CM | POA: Insufficient documentation

## 2017-01-11 DIAGNOSIS — R112 Nausea with vomiting, unspecified: Secondary | ICD-10-CM

## 2017-01-11 DIAGNOSIS — D6481 Anemia due to antineoplastic chemotherapy: Secondary | ICD-10-CM | POA: Diagnosis not present

## 2017-01-11 DIAGNOSIS — M8580 Other specified disorders of bone density and structure, unspecified site: Secondary | ICD-10-CM

## 2017-01-11 DIAGNOSIS — Z7189 Other specified counseling: Secondary | ICD-10-CM

## 2017-01-11 DIAGNOSIS — E876 Hypokalemia: Secondary | ICD-10-CM | POA: Diagnosis not present

## 2017-01-11 DIAGNOSIS — R530 Neoplastic (malignant) related fatigue: Secondary | ICD-10-CM | POA: Insufficient documentation

## 2017-01-11 DIAGNOSIS — C7A8 Other malignant neuroendocrine tumors: Secondary | ICD-10-CM | POA: Diagnosis not present

## 2017-01-11 DIAGNOSIS — C7A026 Malignant carcinoid tumor of the rectum: Secondary | ICD-10-CM

## 2017-01-11 DIAGNOSIS — Z87442 Personal history of urinary calculi: Secondary | ICD-10-CM | POA: Insufficient documentation

## 2017-01-11 DIAGNOSIS — R1310 Dysphagia, unspecified: Secondary | ICD-10-CM | POA: Diagnosis not present

## 2017-01-11 DIAGNOSIS — L089 Local infection of the skin and subcutaneous tissue, unspecified: Secondary | ICD-10-CM | POA: Diagnosis not present

## 2017-01-11 LAB — CBC WITH DIFFERENTIAL/PLATELET
BASOS PCT: 1 %
Basophils Absolute: 0.1 10*3/uL (ref 0–0.1)
EOS ABS: 0.7 10*3/uL (ref 0–0.7)
Eosinophils Relative: 6 %
HCT: 28.1 % — ABNORMAL LOW (ref 35.0–47.0)
HEMOGLOBIN: 9.6 g/dL — AB (ref 12.0–16.0)
Lymphocytes Relative: 8 %
Lymphs Abs: 0.9 10*3/uL — ABNORMAL LOW (ref 1.0–3.6)
MCH: 29 pg (ref 26.0–34.0)
MCHC: 34.1 g/dL (ref 32.0–36.0)
MCV: 85 fL (ref 80.0–100.0)
MONOS PCT: 12 %
Monocytes Absolute: 1.2 10*3/uL — ABNORMAL HIGH (ref 0.2–0.9)
NEUTROS PCT: 73 %
Neutro Abs: 7.9 10*3/uL — ABNORMAL HIGH (ref 1.4–6.5)
PLATELETS: 136 10*3/uL — AB (ref 150–440)
RBC: 3.3 MIL/uL — ABNORMAL LOW (ref 3.80–5.20)
RDW: 23.2 % — ABNORMAL HIGH (ref 11.5–14.5)
WBC: 10.8 10*3/uL (ref 3.6–11.0)

## 2017-01-11 LAB — COMPREHENSIVE METABOLIC PANEL
ALBUMIN: 3.3 g/dL — AB (ref 3.5–5.0)
ALK PHOS: 438 U/L — AB (ref 38–126)
ALT: 34 U/L (ref 14–54)
ANION GAP: 13 (ref 5–15)
AST: 64 U/L — ABNORMAL HIGH (ref 15–41)
BUN: 15 mg/dL (ref 6–20)
CHLORIDE: 104 mmol/L (ref 101–111)
CO2: 19 mmol/L — AB (ref 22–32)
Calcium: 9.3 mg/dL (ref 8.9–10.3)
Creatinine, Ser: 1.02 mg/dL — ABNORMAL HIGH (ref 0.44–1.00)
GFR calc Af Amer: >60 mL/min (ref >60)
GFR calc non Af Amer: 56 mL/min — ABNORMAL LOW (ref >60)
GLUCOSE: 178 mg/dL — AB (ref 65–99)
POTASSIUM: 3.5 mmol/L (ref 3.5–5.1)
SODIUM: 136 mmol/L (ref 135–145)
Total Bilirubin: 0.8 mg/dL (ref 0.3–1.2)
Total Protein: 7 g/dL (ref 6.5–8.1)

## 2017-01-11 MED ORDER — SODIUM CHLORIDE 0.9% FLUSH
10.0000 mL | INTRAVENOUS | Status: DC | PRN
  Administered 2017-01-11: 10 mL
  Filled 2017-01-11: qty 10

## 2017-01-11 MED ORDER — SODIUM CHLORIDE 0.9 % IV SOLN
420.0000 mg | Freq: Once | INTRAVENOUS | Status: AC
  Administered 2017-01-11: 420 mg via INTRAVENOUS
  Filled 2017-01-11: qty 42

## 2017-01-11 MED ORDER — SODIUM CHLORIDE 0.9 % IV SOLN
Freq: Once | INTRAVENOUS | Status: AC
  Administered 2017-01-11: 10:00:00 via INTRAVENOUS
  Filled 2017-01-11: qty 1000

## 2017-01-11 MED ORDER — SODIUM CHLORIDE 0.9 % IV SOLN
100.0000 mg/m2 | Freq: Once | INTRAVENOUS | Status: AC
  Administered 2017-01-11: 180 mg via INTRAVENOUS
  Filled 2017-01-11: qty 9

## 2017-01-11 MED ORDER — ONDANSETRON 4 MG PO TBDP: 4.0000 mg | ORAL_TABLET | Freq: Three times a day (TID) | ORAL | 0 refills | Status: AC | PRN

## 2017-01-11 MED ORDER — HEPARIN SOD (PORK) LOCK FLUSH 100 UNIT/ML IV SOLN
500.0000 [IU] | Freq: Once | INTRAVENOUS | Status: AC | PRN
  Administered 2017-01-11: 500 [IU]
  Filled 2017-01-11: qty 5

## 2017-01-11 MED ORDER — PALONOSETRON HCL INJECTION 0.25 MG/5ML
0.2500 mg | Freq: Once | INTRAVENOUS | Status: AC
  Administered 2017-01-11: 0.25 mg via INTRAVENOUS
  Filled 2017-01-11: qty 5

## 2017-01-11 MED ORDER — DEXAMETHASONE SODIUM PHOSPHATE 10 MG/ML IJ SOLN
10.0000 mg | Freq: Once | INTRAMUSCULAR | Status: AC
Start: 2017-01-11 — End: 2017-01-11
  Administered 2017-01-11: 10 mg via INTRAVENOUS
  Filled 2017-01-11: qty 1
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
## 2017-01-11 NOTE — Progress Notes (Signed)
Patient has been SOB and weak since radiation.  States she is not sleeping well at night because she gets nauseated and it wakes her up.  States she is eating but not as much and when her stomach gets empty she gets nauseated.  Patients HR 121.  Manual recheck HR 104.  Patient states she is experiencing numbness in her chin and bottom lip that started a while back, now has gotten worse.

## 2017-01-12 ENCOUNTER — Inpatient Hospital Stay: Payer: Medicare Other

## 2017-01-12 VITALS — BP 145/82 | HR 101 | Temp 97.0°F | Resp 20

## 2017-01-12 DIAGNOSIS — Z7189 Other specified counseling: Secondary | ICD-10-CM

## 2017-01-12 DIAGNOSIS — C7A8 Other malignant neuroendocrine tumors: Secondary | ICD-10-CM | POA: Diagnosis not present

## 2017-01-12 DIAGNOSIS — C7A1 Malignant poorly differentiated neuroendocrine tumors: Secondary | ICD-10-CM

## 2017-01-12 MED ORDER — SODIUM CHLORIDE 0.9 % IV SOLN
Freq: Once | INTRAVENOUS | Status: AC
  Administered 2017-01-12: 14:00:00 via INTRAVENOUS
  Filled 2017-01-12: qty 1000

## 2017-01-12 MED ORDER — HEPARIN SOD (PORK) LOCK FLUSH 100 UNIT/ML IV SOLN
500.0000 [IU] | Freq: Once | INTRAVENOUS | Status: AC
  Administered 2017-01-12: 500 [IU] via INTRAVENOUS

## 2017-01-12 MED ORDER — SODIUM CHLORIDE 0.9 % IV SOLN
100.0000 mg/m2 | Freq: Once | INTRAVENOUS | Status: AC
  Administered 2017-01-12: 180 mg via INTRAVENOUS
  Filled 2017-01-12: qty 9

## 2017-01-12 MED ORDER — DEXAMETHASONE SODIUM PHOSPHATE 10 MG/ML IJ SOLN
10.0000 mg | Freq: Once | INTRAMUSCULAR | Status: AC
  Administered 2017-01-12: 10 mg via INTRAVENOUS
  Filled 2017-01-12: qty 1

## 2017-01-13 ENCOUNTER — Inpatient Hospital Stay: Payer: Medicare Other

## 2017-01-13 ENCOUNTER — Other Ambulatory Visit: Payer: Self-pay | Admitting: Oncology

## 2017-01-13 VITALS — BP 132/80 | HR 108 | Temp 98.8°F | Resp 18

## 2017-01-13 DIAGNOSIS — C7A1 Malignant poorly differentiated neuroendocrine tumors: Secondary | ICD-10-CM

## 2017-01-13 DIAGNOSIS — Z7189 Other specified counseling: Secondary | ICD-10-CM

## 2017-01-13 DIAGNOSIS — C7A8 Other malignant neuroendocrine tumors: Secondary | ICD-10-CM | POA: Diagnosis not present

## 2017-01-13 MED ORDER — SODIUM CHLORIDE 0.9% FLUSH
10.0000 mL | INTRAVENOUS | Status: DC | PRN
  Administered 2017-01-13 (×2): 10 mL
  Filled 2017-01-13: qty 10

## 2017-01-13 MED ORDER — SODIUM CHLORIDE 0.9 % IV SOLN
Freq: Once | INTRAVENOUS | Status: AC
  Administered 2017-01-13: 14:00:00 via INTRAVENOUS
  Filled 2017-01-13: qty 1000

## 2017-01-13 MED ORDER — PROCHLORPERAZINE MALEATE 10 MG PO TABS
10.0000 mg | ORAL_TABLET | Freq: Once | ORAL | Status: AC
  Administered 2017-01-13: 10 mg via ORAL
  Filled 2017-01-13: qty 1

## 2017-01-13 MED ORDER — PEGFILGRASTIM 6 MG/0.6ML ~~LOC~~ PSKT
6.0000 mg | PREFILLED_SYRINGE | Freq: Once | SUBCUTANEOUS | Status: AC
  Administered 2017-01-13: 6 mg via SUBCUTANEOUS
  Filled 2017-01-13: qty 0.6

## 2017-01-13 MED ORDER — DEXAMETHASONE SODIUM PHOSPHATE 10 MG/ML IJ SOLN
10.0000 mg | Freq: Once | INTRAMUSCULAR | Status: AC
  Administered 2017-01-13: 10 mg via INTRAVENOUS
  Filled 2017-01-13: qty 1

## 2017-01-13 MED ORDER — HEPARIN SOD (PORK) LOCK FLUSH 100 UNIT/ML IV SOLN
500.0000 [IU] | Freq: Once | INTRAVENOUS | Status: AC | PRN
  Administered 2017-01-13: 500 [IU]
  Filled 2017-01-13: qty 5

## 2017-01-13 MED ORDER — SODIUM CHLORIDE 0.9 % IV SOLN
100.0000 mg/m2 | Freq: Once | INTRAVENOUS | Status: AC
  Administered 2017-01-13: 180 mg via INTRAVENOUS
  Filled 2017-01-13: qty 9

## 2017-01-13 NOTE — Progress Notes (Signed)
Pt with complaints of nausea during her etoposide infusion.  Pt self administered her PO zofran 4 mg with no relief noted.  Called Dr. Janese Banks.  Pt given compazine 10mg  PO per Dr. Janese Banks.  Reports feeling much better.  Pt left chemo area stable and ambulatory with her husband.

## 2017-01-24 ENCOUNTER — Inpatient Hospital Stay: Payer: Medicare Other

## 2017-01-24 ENCOUNTER — Telehealth: Payer: Self-pay | Admitting: *Deleted

## 2017-01-24 ENCOUNTER — Other Ambulatory Visit: Payer: Self-pay | Admitting: Oncology

## 2017-01-24 ENCOUNTER — Other Ambulatory Visit: Payer: Self-pay | Admitting: *Deleted

## 2017-01-24 VITALS — BP 128/74 | HR 96 | Temp 100.1°F | Resp 20

## 2017-01-24 DIAGNOSIS — D6481 Anemia due to antineoplastic chemotherapy: Secondary | ICD-10-CM

## 2017-01-24 DIAGNOSIS — D649 Anemia, unspecified: Secondary | ICD-10-CM

## 2017-01-24 DIAGNOSIS — C7951 Secondary malignant neoplasm of bone: Secondary | ICD-10-CM

## 2017-01-24 DIAGNOSIS — C7A1 Malignant poorly differentiated neuroendocrine tumors: Secondary | ICD-10-CM

## 2017-01-24 DIAGNOSIS — C7A8 Other malignant neuroendocrine tumors: Secondary | ICD-10-CM | POA: Diagnosis not present

## 2017-01-24 DIAGNOSIS — E860 Dehydration: Secondary | ICD-10-CM

## 2017-01-24 DIAGNOSIS — T451X5A Adverse effect of antineoplastic and immunosuppressive drugs, initial encounter: Secondary | ICD-10-CM

## 2017-01-24 DIAGNOSIS — E876 Hypokalemia: Secondary | ICD-10-CM

## 2017-01-24 LAB — CBC WITH DIFFERENTIAL/PLATELET
BASOS PCT: 0 %
Basophils Absolute: 0 10*3/uL (ref 0–0.1)
EOS ABS: 0 10*3/uL (ref 0–0.7)
Eosinophils Relative: 0 %
HCT: 20.6 % — ABNORMAL LOW (ref 35.0–47.0)
HEMOGLOBIN: 7.2 g/dL — AB (ref 12.0–16.0)
LYMPHS ABS: 0.9 10*3/uL — AB (ref 1.0–3.6)
LYMPHS PCT: 12 %
MCH: 29.5 pg (ref 26.0–34.0)
MCHC: 34.7 g/dL (ref 32.0–36.0)
MCV: 85.1 fL (ref 80.0–100.0)
MONOS PCT: 13 %
Monocytes Absolute: 1 10*3/uL — ABNORMAL HIGH (ref 0.2–0.9)
NEUTROS ABS: 5.6 10*3/uL (ref 1.4–6.5)
Neutrophils Relative %: 75 %
Platelets: 46 10*3/uL — ABNORMAL LOW (ref 150–440)
RBC: 2.42 MIL/uL — ABNORMAL LOW (ref 3.80–5.20)
RDW: 20.2 % — ABNORMAL HIGH (ref 11.5–14.5)
SMEAR REVIEW: DECREASED
WBC: 7.5 10*3/uL (ref 3.6–11.0)

## 2017-01-24 LAB — COMPREHENSIVE METABOLIC PANEL
ALK PHOS: 323 U/L — AB (ref 38–126)
ALT: 35 U/L (ref 14–54)
AST: 53 U/L — ABNORMAL HIGH (ref 15–41)
Albumin: 2.9 g/dL — ABNORMAL LOW (ref 3.5–5.0)
Anion gap: 12 (ref 5–15)
BUN: 15 mg/dL (ref 6–20)
CO2: 20 mmol/L — ABNORMAL LOW (ref 22–32)
CREATININE: 1.62 mg/dL — AB (ref 0.44–1.00)
Calcium: 9.4 mg/dL (ref 8.9–10.3)
Chloride: 100 mmol/L — ABNORMAL LOW (ref 101–111)
GFR, EST AFRICAN AMERICAN: 37 mL/min — AB (ref >60)
GFR, EST NON AFRICAN AMERICAN: 32 mL/min — AB (ref >60)
Glucose, Bld: 181 mg/dL — ABNORMAL HIGH (ref 65–99)
Potassium: 2.9 mmol/L — ABNORMAL LOW (ref 3.5–5.1)
Sodium: 132 mmol/L — ABNORMAL LOW (ref 135–145)
TOTAL PROTEIN: 6.8 g/dL (ref 6.5–8.1)
Total Bilirubin: 1.2 mg/dL (ref 0.3–1.2)

## 2017-01-24 LAB — MAGNESIUM: Magnesium: 2.1 mg/dL (ref 1.7–2.4)

## 2017-01-24 LAB — ABO/RH: ABO/RH(D): AB POS

## 2017-01-24 LAB — PREPARE RBC (CROSSMATCH)

## 2017-01-24 MED ORDER — SODIUM CHLORIDE 0.9 % IV SOLN
Freq: Once | INTRAVENOUS | Status: AC
  Administered 2017-01-24: 12:00:00 via INTRAVENOUS
  Filled 2017-01-24: qty 1000

## 2017-01-24 MED ORDER — HEPARIN SOD (PORK) LOCK FLUSH 100 UNIT/ML IV SOLN
500.0000 [IU] | Freq: Every day | INTRAVENOUS | Status: AC | PRN
  Administered 2017-01-24: 500 [IU]
  Filled 2017-01-24: qty 5

## 2017-01-24 MED ORDER — FLUCONAZOLE 100 MG PO TABS: 100.0000 mg | ORAL_TABLET | Freq: Every day | ORAL | 0 refills | Status: AC

## 2017-01-24 MED ORDER — ACETAMINOPHEN 325 MG PO TABS
650.0000 mg | ORAL_TABLET | Freq: Once | ORAL | Status: AC
  Administered 2017-01-24: 650 mg via ORAL
  Filled 2017-01-24: qty 2

## 2017-01-24 MED ORDER — SODIUM CHLORIDE 0.9 % IV SOLN
250.0000 mL | Freq: Once | INTRAVENOUS | Status: AC
  Administered 2017-01-24: 250 mL via INTRAVENOUS
  Filled 2017-01-24: qty 250

## 2017-01-24 MED ORDER — SODIUM CHLORIDE 0.9% FLUSH
10.0000 mL | INTRAVENOUS | Status: AC | PRN
  Administered 2017-01-24: 10 mL
  Filled 2017-01-24: qty 10

## 2017-01-24 NOTE — Progress Notes (Signed)
Pt reports to chemo area today for labs and possible IVF.  Pt reports that she has not been drinking well due to her nausea and thrush that she has.  She states that she maybe drinks 8-16 ounces of water per day and that a Charity fundraiser checked her urine that was "positive for protein so she is dehydrated".  Pt reports that her urine is "nasty" when she does go but that she does not have any burning or frequency or urgency.  Temp noted to be 100.9 upon arrival to chemo area.  VS charted as well as orthostatic BP.  HR 119-123.  Pt to get KCL IVF as ordered by Dr. Janese Banks and also to get 1 unit blood transfusion per Dr. Janese Banks.  Pt to return tomorrow for further evaluation with her doctor.

## 2017-01-24 NOTE — Telephone Encounter (Signed)
Pt 's husband called to say that pt felt she needed fluids.  She has not got appetite, not drinking as much. She remembered that last tie she felt this way the fluids helped her.  She would like some today.  She has not vomited in 2 days.  Dr. Janese Banks said yes to fluids. Draw labs and she will get fluids but may need to add K or mag. If levels low. I called Kim in chemo and she was agreeable to labs at 1 pm and fluids at 1:30. I called husband back and he is agreeable to the plan.

## 2017-01-25 ENCOUNTER — Encounter: Payer: Self-pay | Admitting: Oncology

## 2017-01-25 ENCOUNTER — Inpatient Hospital Stay: Payer: Medicare Other

## 2017-01-25 ENCOUNTER — Inpatient Hospital Stay (HOSPITAL_BASED_OUTPATIENT_CLINIC_OR_DEPARTMENT_OTHER): Payer: Medicare Other | Admitting: Oncology

## 2017-01-25 ENCOUNTER — Other Ambulatory Visit: Payer: Self-pay | Admitting: *Deleted

## 2017-01-25 VITALS — BP 118/77 | HR 116 | Temp 97.9°F | Resp 18 | Ht 66.0 in | Wt 143.2 lb

## 2017-01-25 DIAGNOSIS — C787 Secondary malignant neoplasm of liver and intrahepatic bile duct: Secondary | ICD-10-CM | POA: Diagnosis not present

## 2017-01-25 DIAGNOSIS — E860 Dehydration: Secondary | ICD-10-CM

## 2017-01-25 DIAGNOSIS — D696 Thrombocytopenia, unspecified: Secondary | ICD-10-CM

## 2017-01-25 DIAGNOSIS — C7A026 Malignant carcinoid tumor of the rectum: Secondary | ICD-10-CM

## 2017-01-25 DIAGNOSIS — Z800 Family history of malignant neoplasm of digestive organs: Secondary | ICD-10-CM

## 2017-01-25 DIAGNOSIS — D6481 Anemia due to antineoplastic chemotherapy: Secondary | ICD-10-CM | POA: Diagnosis not present

## 2017-01-25 DIAGNOSIS — R0600 Dyspnea, unspecified: Secondary | ICD-10-CM

## 2017-01-25 DIAGNOSIS — C7951 Secondary malignant neoplasm of bone: Secondary | ICD-10-CM

## 2017-01-25 DIAGNOSIS — E782 Mixed hyperlipidemia: Secondary | ICD-10-CM

## 2017-01-25 DIAGNOSIS — M8580 Other specified disorders of bone density and structure, unspecified site: Secondary | ICD-10-CM

## 2017-01-25 DIAGNOSIS — B370 Candidal stomatitis: Secondary | ICD-10-CM | POA: Diagnosis not present

## 2017-01-25 DIAGNOSIS — I10 Essential (primary) hypertension: Secondary | ICD-10-CM

## 2017-01-25 DIAGNOSIS — C7A1 Malignant poorly differentiated neuroendocrine tumors: Secondary | ICD-10-CM

## 2017-01-25 DIAGNOSIS — R200 Anesthesia of skin: Secondary | ICD-10-CM

## 2017-01-25 DIAGNOSIS — T451X5S Adverse effect of antineoplastic and immunosuppressive drugs, sequela: Secondary | ICD-10-CM

## 2017-01-25 DIAGNOSIS — R110 Nausea: Secondary | ICD-10-CM

## 2017-01-25 DIAGNOSIS — T451X5A Adverse effect of antineoplastic and immunosuppressive drugs, initial encounter: Secondary | ICD-10-CM

## 2017-01-25 DIAGNOSIS — R5383 Other fatigue: Secondary | ICD-10-CM

## 2017-01-25 DIAGNOSIS — C7800 Secondary malignant neoplasm of unspecified lung: Secondary | ICD-10-CM

## 2017-01-25 DIAGNOSIS — D649 Anemia, unspecified: Secondary | ICD-10-CM

## 2017-01-25 DIAGNOSIS — D6959 Other secondary thrombocytopenia: Secondary | ICD-10-CM | POA: Diagnosis not present

## 2017-01-25 DIAGNOSIS — C7A8 Other malignant neuroendocrine tumors: Secondary | ICD-10-CM | POA: Diagnosis not present

## 2017-01-25 DIAGNOSIS — C778 Secondary and unspecified malignant neoplasm of lymph nodes of multiple regions: Secondary | ICD-10-CM | POA: Diagnosis not present

## 2017-01-25 DIAGNOSIS — G893 Neoplasm related pain (acute) (chronic): Secondary | ICD-10-CM

## 2017-01-25 DIAGNOSIS — Z79899 Other long term (current) drug therapy: Secondary | ICD-10-CM

## 2017-01-25 DIAGNOSIS — R5381 Other malaise: Secondary | ICD-10-CM

## 2017-01-25 DIAGNOSIS — Z87442 Personal history of urinary calculi: Secondary | ICD-10-CM

## 2017-01-25 DIAGNOSIS — K640 First degree hemorrhoids: Secondary | ICD-10-CM

## 2017-01-25 LAB — BPAM RBC
BLOOD PRODUCT EXPIRATION DATE: 201804222359
ISSUE DATE / TIME: 201804161427
Unit Type and Rh: 600

## 2017-01-25 LAB — TYPE AND SCREEN
ABO/RH(D): AB POS
ANTIBODY SCREEN: NEGATIVE
Unit division: 0

## 2017-01-25 MED ORDER — HEPARIN SOD (PORK) LOCK FLUSH 100 UNIT/ML IV SOLN
INTRAVENOUS | Status: AC
  Filled 2017-01-25: qty 5

## 2017-01-25 MED ORDER — SODIUM CHLORIDE 0.9 % IV SOLN
Freq: Once | INTRAVENOUS | Status: AC
  Administered 2017-01-25: 10:00:00 via INTRAVENOUS
  Filled 2017-01-25: qty 1000

## 2017-01-25 MED ORDER — SODIUM CHLORIDE 0.9 % IV SOLN
INTRAVENOUS | Status: DC
  Filled 2017-01-25: qty 1000

## 2017-01-25 MED ORDER — HEPARIN SOD (PORK) LOCK FLUSH 100 UNIT/ML IV SOLN
500.0000 [IU] | Freq: Once | INTRAVENOUS | Status: AC
  Administered 2017-01-25: 500 [IU] via INTRAVENOUS

## 2017-01-25 NOTE — Progress Notes (Signed)
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 patient is a 66 year old female follows up with [facility] clinic GI. He has a history of villous adenoma of the rectum in 2005 status post excision. Subsequent colonoscopies have been negative for malignancy.Colonoscopy on 05/12/2015 which showed sessile polyps in the sigmoid colon and hepatic flexure along with grade 1 internal hemorrhoids.   2. She had been having some right-sided abdominal pain and was seen in urgent carein December 2018fr what was thought to be pain from her kidney stones. She was seen by PCP and urology and the kidney stones were thought to be small and nonobstructing and did not feel that this is the cause for abdominal pain   3. She underwent ultrasound of the abdomen for further evaluation on 09/23/2016 which showed a solid mass in the right lobe of the liver measuring 4.8 x 5 x 5 cm. This was followed by a CT abdomen the same day which showed a hypoattenuating/hypoenhancing liver lesions which were new since 2010. Index posterior right hepatic lobe lesion was 4.9 x 4.7 cm,lesion straddling the anterior right and medial left hepatic lobe measures 3.5 x 5.2 cm. Weight left-sided lesions up to 1.2 cm. Suspicion of asymmetric wall thickening in the posterior left side of the rectum. Borderline to mild left inguinal adenopathy of 1 cm and perirectal adenopathy up to 1.8 cm suspicious presacral node measuring 8 mm. New multifocal nauseous lesions including  within the inferior aspect of the L3 vertebral body. Bibasilar pulmonary nodules suspicious for malignancy  4. Patient had a repeat colonoscopy on 09/27/2016 which showed many polyps in the rectum and the rectosigmoid colon which were biopsied. Internal hemorrhoids. Biopsy showed hyperplastic polyps negative for dysplasia and malignancy  5. PET CT scan showed widespread metastatic disease with hypermetabolism in the mediastinal and left hilar lymph nodes. Multifocal pulmonary nodules compatible with thoracic metastases. Extensive liver metastases as well as upper abdominal and pelvic lymph node metastases. Widespread nauseous metastatic disease and at least one pathologic fracture involving the L2 vertebra.  6. Ultrasound-guided biopsy of the liver showed high-grade neuroendocrine carcinoma with Ki-67 of 80%. CD 56 positive and synatophysindiffuse moderate staining.  7. Patient received cycle 1 of chemotherapy carboplatin/etoposidefrom 10/18/2016 to 10/20/2016  8. Patient received 1st dose of zometa on 11/03/16  9. Foundation one testing was done which showed: PDL1 expression was 0%. 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 MSI stable.   10. [facility] and etoposide first-line  11. After 2 cycles of chemotherapy she had interimscans which showed response in her liver as well as intra-abdominal adenopathy but worsening bone lesions. There were significant compression fractures throughout her spine as well as impending fractures. She was seen by orthopedic surgery and there was no acute role for surgical management. She was referred to radiation oncology and started her radiation on 12/09/2016  12. Initially the plan was to switch her to second line palliative chemotherapy with FOLFIRI given progression in the bones. However given that she  had a second response in her liver as well as lymph nodes to carbo/etoposide- plan  for now is to continue with carbo/etoposide for 2 more cycles and then to re scan her.   Interval history- had bouts of cough on taking pills that led to vomiting about once a day for last few days. No fever. Feels fatigued and is only able to walk around the house with difficulty  ECOG PS- 2 Pain scale- 0 Opioid associated constipation- no  Review of systems- Review of Systems  Constitutional: Positive for malaise/fatigue. Negative for chills, fever and weight loss.  HENT: Negative for congestion, ear discharge and nosebleeds.   Eyes: Negative for blurred vision.  Respiratory: Positive for shortness of breath. Negative for cough, hemoptysis, sputum production and wheezing.   Cardiovascular: Negative for chest pain, palpitations, orthopnea and claudication.  Gastrointestinal: Positive for vomiting. Negative for abdominal pain, blood in stool, constipation, diarrhea, heartburn, melena and nausea.  Genitourinary: Negative for dysuria, flank pain, frequency, hematuria and urgency.  Musculoskeletal: Negative for back pain, joint pain and myalgias.  Skin: Negative for rash.  Neurological: Negative for dizziness, tingling, focal weakness, seizures, weakness and headaches.  Endo/Heme/Allergies: Does not bruise/bleed easily.  Psychiatric/Behavioral: Negative for depression and suicidal ideas. The patient does not have insomnia.       Allergies  Allergen Reactions  . Codeine Nausea And Vomiting     Past Medical History:  Diagnosis Date  . Allergic rhinitis   . Anxiety   . Asthma   . Attention deficit disorder without mention of hyperactivity   . Calculus of kidney   . Cataract   . Diffuse cystic mastopathy   . High cholesterol   . History of kidney stones   . Hypoglycemia, unspecified   . Migraine, unspecified, without mention of intractable migraine without mention of status migrainosus   . Mixed hyperlipidemia   . Osteopenia   . Precancerous lesion   . Unspecified essential  hypertension      Past Surgical History:  Procedure Laterality Date  . ABDOMINAL HYSTERECTOMY     10/2002  . APPENDECTOMY    . COLONOSCOPY  5/06  . COLONOSCOPY WITH PROPOFOL N/A 05/12/2015   Procedure: COLONOSCOPY WITH PROPOFOL;  Surgeon: Manya Silvas, MD;  Location: [facility] ENDOSCOPY;  Service: Endoscopy;  Laterality: N/A;  . COLONOSCOPY WITH PROPOFOL N/A 09/27/2016   Procedure: COLONOSCOPY WITH PROPOFOL;  Surgeon: Manya Silvas, MD;  Location: [facility] ENDOSCOPY;  Service: Endoscopy;  Laterality: N/A;  . EYE SURGERY     multiple; s/p childhood trauma  . LITHOTRIPSY    . 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 PERIPHERAL VASCULAR CATHETERIZATION N/A 11/04/2016   Procedure: Glori Luis Cath Insertion;  Surgeon: Algernon Huxley, MD;  Location: [place] [facility] LAB;  Service: Cardiovascular;  Laterality: N/A;  . TONSILLECTOMY      Social History   Social History  . Marital status: Married    Spouse name: N/A  . Number of children: 2  . Years of education: N/A   Occupational History  . Retired    Social History Main Topics  . Smoking status: Never Smoker  . Smokeless tobacco: Never Used  . Alcohol use 0.6 oz/week    1 Shots of liquor per week     Comment: occasional last  month  . Drug use: No  . Sexual activity: No   Other Topics Concern  . 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 Diabetes Father   . Colon cancer Father   . Hypertension Father   . Heart attack Father   . Heart attack Mother   . Coronary artery disease Mother   . Pancreatic cancer Mother   . Hyperlipidemia Mother   . Other Brother     Congenital hyperspadias  . Other Brother     loss of kidney function in 1 kidney early in life  . Diabetes      Grandmother  . Coronary artery disease Maternal Grandmother   . Coronary  artery disease      Maternal aunts and uncles  . Breast cancer Neg Hx   . Ovarian cancer Neg Hx   . Uterine cancer Neg Hx      Current Outpatient Prescriptions:  .  amLODipine (NORVASC) 5 MG tablet, Take 1 tablet (5 mg total) by mouth daily., Disp: 90 tablet, Rfl: 3 .  cetirizine (ZYRTEC) 10 MG tablet, Take 10 mg by mouth daily.  , Disp: , Rfl:  .  cholecalciferol (VITAMIN D) 1000 UNITS tablet, Take 1,000 Units by mouth daily.  , Disp: , Rfl:  .  Coenzyme Q10 (COQ10) 400 MG CAPS, Take 400 mg by mouth daily. , Disp: , Rfl:  .  cyclobenzaprine (FLEXERIL) 5 MG tablet, Take 1 tablet (5 mg total) by mouth 3 (three) times daily as needed for muscle spasms., Disp: 30 tablet, Rfl: 0 .  dexamethasone (DECADRON) 2 MG tablet, Take 1 tablet (2 mg total) by mouth daily. (Patient taking differently: Take 1 mg by mouth daily. ), Disp: 30 tablet, Rfl: 0 .  Flaxseed, Linseed, (FLAX SEED OIL) 1000 MG CAPS, Take 1 capsule by mouth daily., Disp: , Rfl:  .  fluconazole (DIFLUCAN) 100 MG tablet, Take 1 tablet (100 mg total) by mouth daily. Take 2 tablets on day one and then 1 tablet daily x 3 weeks, Disp: 23 tablet, Rfl: 0 .  fluticasone (FLONASE) 50 MCG/ACT nasal spray, Place 2 sprays into both nostrils daily., Disp: 48 g, Rfl: 3 .  HYDROcodone-acetaminophen (NORCO/VICODIN) 5-325 MG tablet, Take 1-2 tablets by mouth every 6 (six) hours as needed., Disp: 40 tablet, Rfl: 0 .  lidocaine (LIDODERM) 5 %, Place 1 patch onto the skin daily. Remove & Discard patch within 12 hours of use each day, Disp: 30 patch, Rfl: 0 .  lidocaine-prilocaine (EMLA) cream, Apply 1 application topically as needed. Apply to port 2 hours prior to appt, Disp: 30 g, Rfl: 2 .  LUMIGAN 0.01 % SOLN, Place 1 drop into the right eye at bedtime., Disp: , Rfl: 0 .  morphine (MS CONTIN) 15 MG 12 hr tablet, Take 15 mg by mouth every 12 (twelve) hours., Disp: , Rfl: 0 .  morphine (MSIR) 15 MG tablet, take 1 tablet by mouth every 4 hours if needed for  severe pain, Disp: , Rfl: 0 .  Multiple Vitamin (MULTIVITAMIN) tablet, Take 1 tablet by mouth daily.  , Disp: , Rfl:  .  naloxone (NARCAN) nasal spray 4 mg/0.1 mL, Place 1 spray into the nose daily as needed (opioid overdose)., Disp: , Rfl:  .  nitrofurantoin, macrocrystal-monohydrate, (MACROBID) 100 MG capsule, Take 1 capsule (100 mg total) by mouth 2 (two) times daily. (Patient not taking: Reported on 01/11/2017),  Disp: 14 capsule, Rfl: 0 .  nystatin (MYCOSTATIN) 100000 UNIT/ML suspension, take 5 milliliters by mouth four times a day, Disp: 120 mL, Rfl: 1 .  Omega-3 Fatty Acids (FISH OIL) 1200 MG CAPS, Take 1,200 mg by mouth daily. , Disp: , Rfl:  .  ondansetron (ZOFRAN) 4 MG tablet, take 1 tablet by mouth every 6 hours if needed for nausea and vomiting, Disp: , Rfl: 0 .  ondansetron (ZOFRAN-ODT) 4 MG disintegrating tablet, Take 1 tablet (4 mg total) by mouth every 8 (eight) hours as needed for nausea or vomiting., Disp: 20 tablet, Rfl: 0 .  Oxycodone HCl 10 MG TABS, take 78m by mouth twice a day if needed for pain, Disp: , Rfl: 0 .  Probiotic Product (PROBIOTIC DAILY PO), Take 1 capsule by mouth daily., Disp: , Rfl:  .  prochlorperazine (COMPAZINE) 10 MG tablet, Take 1 tablet (10 mg total) by mouth every 6 (six) hours as needed for nausea or vomiting., Disp: 30 tablet, Rfl: 0 .  rosuvastatin (CRESTOR) 20 MG tablet, Take one and one half tablets by mouth once daily for cholesterol (Patient taking differently: Take 30 mg by mouth daily. ), Disp: 135 tablet, Rfl: 3 .  venlafaxine XR (EFFEXOR-XR) 75 MG 24 hr capsule, take 1 capsule by mouth once daily, Disp: 90 capsule, Rfl: 3  Physical exam:  Vitals:   01/25/17 0919  BP: 118/77  Pulse: (!) 116  Resp: 18  Temp: 97.9 F (36.6 C)  TempSrc: Tympanic  Weight: 143 lb 3.2 oz (65 kg)  Height: 5' 6"  (1.676 m)   Physical Exam  Constitutional: She is oriented to person, place, and time.  Fatigued, sitting in a wheelchair, no acute distress  HENT:    Head: Normocephalic and atraumatic.  Small patch of thrush noted over hard palate. improving  Eyes: EOM are normal. Pupils are equal, round, and reactive to light.  Neck: Normal range of motion.  Cardiovascular: Regular rhythm and normal heart sounds.   tachycardic  Pulmonary/Chest: Effort normal and breath sounds normal.  Abdominal: Soft. Bowel sounds are normal.  Neurological: She is alert and oriented to person, place, and time.  Skin: Skin is warm and dry.     CMP Latest Ref Rng & Units 01/24/2017  Glucose 65 - 99 mg/dL 181(H)  BUN 6 - 20 mg/dL 15  Creatinine 0.44 - 1.00 mg/dL 1.62(H)  Sodium 135 - 145 mmol/L 132(L)  Potassium 3.5 - 5.1 mmol/L 2.9(L)  Chloride 101 - 111 mmol/L 100(L)  CO2 22 - 32 mmol/L 20(L)  Calcium 8.9 - 10.3 mg/dL 9.4  Total Protein 6.5 - 8.1 g/dL 6.8  Total Bilirubin 0.3 - 1.2 mg/dL 1.2  Alkaline Phos 38 - 126 U/L 323(H)  AST 15 - 41 U/L 53(H)  ALT 14 - 54 U/L 35   CBC Latest Ref Rng & Units 01/24/2017  WBC 3.6 - 11.0 K/uL 7.5  Hemoglobin 12.0 - 16.0 g/dL 7.2(L)  Hematocrit 35.0 - 47.0 % 20.6(L)  Platelets 150 - 440 K/uL 46(L)      Assessment and plan- Patient is a 66y.o. female with high-grade metastatic neuroendocrine carcinoma with metastases to the liver, bones and LN  1. Dehydration- secondary to chemotherapy. Will give 1L fluids today. I have encouraged her to space out her pills and take it with pudding or applesauce  2. Anemia secondary to chemotherapy- s/p 1 unit of prbc yesterday. Will check repeat cbc next week  3. Thrombocytopenia- from chemotherapy. Continue to monitor. Chemo may  have to be delayed by 1 week due to cytopenias. RTC in 1 week  4. Fatigue/ SOB- multifactorial secondary to malignancy,c hemo and anemia  5. Thrush- improving. Continue diflucan     Visit Diagnosis 1. High grade neuroendocrine carcinoma ([place])   2. Fatigue associated with anemia   3. Thrombocytopenia ([place])   4. Anemia due to antineoplastic  chemotherapy   5. Thrush      Dr. Randa Evens, MD, MPH [place] [facility] Pager- [phone] 01/25/2017 9:51 AM

## 2017-01-25 NOTE — Progress Notes (Signed)
Pt had coughing spell and vomited and then had nose bleed that last about 30 min but it was a trickle slowly. Ate yest at cancer center and last night at supper

## 2017-01-27 ENCOUNTER — Other Ambulatory Visit: Payer: Self-pay | Admitting: Oncology

## 2017-01-27 DIAGNOSIS — R112 Nausea with vomiting, unspecified: Secondary | ICD-10-CM

## 2017-01-27 DIAGNOSIS — G893 Neoplasm related pain (acute) (chronic): Secondary | ICD-10-CM

## 2017-01-27 DIAGNOSIS — C7A1 Malignant poorly differentiated neuroendocrine tumors: Secondary | ICD-10-CM

## 2017-02-01 ENCOUNTER — Inpatient Hospital Stay: Payer: Medicare Other

## 2017-02-01 ENCOUNTER — Inpatient Hospital Stay (HOSPITAL_BASED_OUTPATIENT_CLINIC_OR_DEPARTMENT_OTHER): Payer: Medicare Other | Admitting: Oncology

## 2017-02-01 ENCOUNTER — Encounter: Payer: Self-pay | Admitting: Oncology

## 2017-02-01 VITALS — BP 102/69 | HR 100 | Temp 99.9°F | Resp 20 | Wt 141.6 lb

## 2017-02-01 DIAGNOSIS — E876 Hypokalemia: Secondary | ICD-10-CM | POA: Diagnosis not present

## 2017-02-01 DIAGNOSIS — E782 Mixed hyperlipidemia: Secondary | ICD-10-CM

## 2017-02-01 DIAGNOSIS — C778 Secondary and unspecified malignant neoplasm of lymph nodes of multiple regions: Secondary | ICD-10-CM

## 2017-02-01 DIAGNOSIS — Z800 Family history of malignant neoplasm of digestive organs: Secondary | ICD-10-CM

## 2017-02-01 DIAGNOSIS — Z79899 Other long term (current) drug therapy: Secondary | ICD-10-CM

## 2017-02-01 DIAGNOSIS — C7A1 Malignant poorly differentiated neuroendocrine tumors: Secondary | ICD-10-CM

## 2017-02-01 DIAGNOSIS — R531 Weakness: Secondary | ICD-10-CM

## 2017-02-01 DIAGNOSIS — C7951 Secondary malignant neoplasm of bone: Secondary | ICD-10-CM | POA: Diagnosis not present

## 2017-02-01 DIAGNOSIS — L089 Local infection of the skin and subcutaneous tissue, unspecified: Secondary | ICD-10-CM | POA: Diagnosis not present

## 2017-02-01 DIAGNOSIS — C7A8 Other malignant neuroendocrine tumors: Secondary | ICD-10-CM

## 2017-02-01 DIAGNOSIS — C787 Secondary malignant neoplasm of liver and intrahepatic bile duct: Secondary | ICD-10-CM

## 2017-02-01 DIAGNOSIS — R530 Neoplastic (malignant) related fatigue: Secondary | ICD-10-CM

## 2017-02-01 DIAGNOSIS — M8580 Other specified disorders of bone density and structure, unspecified site: Secondary | ICD-10-CM

## 2017-02-01 DIAGNOSIS — E860 Dehydration: Secondary | ICD-10-CM

## 2017-02-01 DIAGNOSIS — Z86010 Personal history of colonic polyps: Secondary | ICD-10-CM

## 2017-02-01 DIAGNOSIS — R5381 Other malaise: Secondary | ICD-10-CM | POA: Diagnosis not present

## 2017-02-01 DIAGNOSIS — R200 Anesthesia of skin: Secondary | ICD-10-CM | POA: Diagnosis not present

## 2017-02-01 DIAGNOSIS — B370 Candidal stomatitis: Secondary | ICD-10-CM | POA: Diagnosis not present

## 2017-02-01 DIAGNOSIS — R1310 Dysphagia, unspecified: Secondary | ICD-10-CM

## 2017-02-01 DIAGNOSIS — Z87442 Personal history of urinary calculi: Secondary | ICD-10-CM

## 2017-02-01 DIAGNOSIS — I10 Essential (primary) hypertension: Secondary | ICD-10-CM

## 2017-02-01 DIAGNOSIS — K640 First degree hemorrhoids: Secondary | ICD-10-CM

## 2017-02-01 LAB — COMPREHENSIVE METABOLIC PANEL
ALK PHOS: 568 U/L — AB (ref 38–126)
ALT: 22 U/L (ref 14–54)
ANION GAP: 12 (ref 5–15)
AST: 39 U/L (ref 15–41)
Albumin: 2.6 g/dL — ABNORMAL LOW (ref 3.5–5.0)
BUN: 13 mg/dL (ref 6–20)
CO2: 21 mmol/L — AB (ref 22–32)
Calcium: 8.3 mg/dL — ABNORMAL LOW (ref 8.9–10.3)
Chloride: 101 mmol/L (ref 101–111)
Creatinine, Ser: 1.49 mg/dL — ABNORMAL HIGH (ref 0.44–1.00)
GFR, EST AFRICAN AMERICAN: 41 mL/min — AB (ref >60)
GFR, EST NON AFRICAN AMERICAN: 36 mL/min — AB (ref >60)
Glucose, Bld: 242 mg/dL — ABNORMAL HIGH (ref 65–99)
Potassium: 3.4 mmol/L — ABNORMAL LOW (ref 3.5–5.1)
SODIUM: 134 mmol/L — AB (ref 135–145)
Total Bilirubin: 0.8 mg/dL (ref 0.3–1.2)
Total Protein: 6.9 g/dL (ref 6.5–8.1)

## 2017-02-01 LAB — CBC WITH DIFFERENTIAL/PLATELET
BASOS ABS: 0.2 10*3/uL — AB (ref 0–0.1)
BASOS PCT: 1 %
Eosinophils Absolute: 0 10*3/uL (ref 0–0.7)
Eosinophils Relative: 0 %
HEMATOCRIT: 26.4 % — AB (ref 35.0–47.0)
HEMOGLOBIN: 9.2 g/dL — AB (ref 12.0–16.0)
Lymphocytes Relative: 6 %
Lymphs Abs: 1.4 10*3/uL (ref 1.0–3.6)
MCH: 29.7 pg (ref 26.0–34.0)
MCHC: 34.6 g/dL (ref 32.0–36.0)
MCV: 85.6 fL (ref 80.0–100.0)
MONOS PCT: 8 %
Monocytes Absolute: 2 10*3/uL — ABNORMAL HIGH (ref 0.2–0.9)
NEUTROS ABS: 21.2 10*3/uL — AB (ref 1.4–6.5)
NEUTROS PCT: 85 %
Platelets: 281 10*3/uL (ref 150–440)
RBC: 3.09 MIL/uL — AB (ref 3.80–5.20)
RDW: 18.5 % — ABNORMAL HIGH (ref 11.5–14.5)
WBC: 24.7 10*3/uL — AB (ref 3.6–11.0)

## 2017-02-01 MED ORDER — SODIUM CHLORIDE 0.9 % IV SOLN
Freq: Once | INTRAVENOUS | Status: AC
Start: 2017-02-01 — End: 2017-02-01
  Administered 2017-02-01: 10:00:00 via INTRAVENOUS
  Filled 2017-02-01: qty 1000

## 2017-02-01 MED ORDER — HEPARIN SOD (PORK) LOCK FLUSH 100 UNIT/ML IV SOLN: 500.0000 [IU] | Freq: Once | INTRAVENOUS | Status: DC | PRN

## 2017-02-01 MED ORDER — HEPARIN SOD (PORK) LOCK FLUSH 100 UNIT/ML IV SOLN
500.0000 [IU] | Freq: Once | INTRAVENOUS | Status: AC
  Administered 2017-02-01: 500 [IU] via INTRAVENOUS

## 2017-02-01 MED ORDER — ZOLEDRONIC ACID 4 MG/5ML IV CONC
3.3000 mg | Freq: Once | INTRAVENOUS | Status: AC
  Administered 2017-02-01: 3.3 mg via INTRAVENOUS
  Filled 2017-02-01: qty 4.13

## 2017-02-01 MED ORDER — SODIUM CHLORIDE 0.9% FLUSH
10.0000 mL | INTRAVENOUS | Status: DC | PRN
  Filled 2017-02-01: qty 10

## 2017-02-01 MED ORDER — SODIUM CHLORIDE 0.9 % IV SOLN
Freq: Once | INTRAVENOUS | Status: AC
  Administered 2017-02-01: 10:00:00 via INTRAVENOUS
  Filled 2017-02-01: qty 1000

## 2017-02-01 MED ORDER — SODIUM CHLORIDE 0.9% FLUSH
10.0000 mL | INTRAVENOUS | Status: DC | PRN
  Administered 2017-02-01: 10 mL via INTRAVENOUS
  Filled 2017-02-01: qty 10

## 2017-02-01 NOTE — Progress Notes (Signed)
Here for f/u has a knot that came up on R side of back per pt-told this writer at end of eval -reddened / raised size of quarter-tender to touch per pt

## 2017-02-01 NOTE — Progress Notes (Signed)
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 patient is a 66 year old female follows up with [facility] clinic GI. He has a history of villous adenoma of the rectum in 2005 status post excision. Subsequent colonoscopies have been negative for malignancy.Colonoscopy on 05/12/2015 which showed sessile polyps in the sigmoid colon and hepatic flexure along with grade 1 internal hemorrhoids.   2. She had been having some right-sided abdominal pain and was seen in urgent carein December 2082fr what was thought to be pain from her kidney stones. She was seen by PCP and urology and the kidney stones were thought to be small and nonobstructing and did not feel that this is the cause for abdominal pain   3. She underwent ultrasound of the abdomen for further evaluation on 09/23/2016 which showed a solid mass in the right lobe of the liver measuring 4.8 x 5 x 5 cm. This was followed by a CT abdomen the same day which showed a hypoattenuating/hypoenhancing liver lesions which were new since 2010. Index posterior right hepatic lobe lesion was 4.9 x 4.7 cm,lesion straddling the anterior right and medial left hepatic lobe measures 3.5 x 5.2 cm. Weight left-sided lesions up to 1.2 cm. Suspicion of asymmetric wall thickening in the posterior left side of the rectum. Borderline to mild left inguinal adenopathy of 1 cm and perirectal adenopathy up to 1.8 cm suspicious presacral node measuring 8 mm. New multifocal nauseous lesions including within the inferior aspect  of the L3 vertebral body. Bibasilar pulmonary nodules suspicious for malignancy  4. Patient had a repeat colonoscopy on 09/27/2016 which showed many polyps in the rectum and the rectosigmoid colon which were biopsied. Internal hemorrhoids. Biopsy showed hyperplastic polyps negative for dysplasia and malignancy  5. PET CT scan showed widespread metastatic disease with hypermetabolism in the mediastinal and left hilar lymph nodes. Multifocal pulmonary nodules compatible with thoracic metastases. Extensive liver metastases as well as upper abdominal and pelvic lymph node metastases. Widespread nauseous metastatic disease and at least one pathologic fracture involving the L2 vertebra.  6. Ultrasound-guided biopsy of the liver showed high-grade neuroendocrine carcinoma with Ki-67 of 80%. CD 56 positive and synatophysindiffuse moderate staining.  7. Patient received cycle 1 of chemotherapy carboplatin/etoposidefrom 10/18/2016 to 10/20/2016  8. Patient received 1st dose of zometa on 11/03/16  9. Foundation one testing was done which showed: PDL1 expression was 0%. 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 MSI stable.   10. [facility] with carboplatin and etoposide first-line  11. After 2 cycles of chemotherapy she had interimscans which showed response in her liver as well as intra-abdominal adenopathy but worsening bone lesions. There were significant compression fractures throughout her spine as well as impending fractures. She was seen by orthopedic surgery and there was no acute role for surgical management. She was referred to radiation oncology and started her radiation on 12/09/2016  12. Initially the plan was to switch her to second line palliative chemotherapy with FOLFIRI given progression in the bones. However given that she had a  second response in her liver as well as lymph nodes to carbo/etoposide- plan for now is to continue with  carbo/etoposide for 2 more cycles and then to re scan her.   Interval history- reports significant fatigue today. She has not been able to moved around as much. Has trouble getting up from sitting position. Still has chin numbness. Patch over her hard palapte is getting better but continues to have difficulty swallowing  ECOG PS- 2-3 Pain scale- 0 Opioid associated constipation- no  Review of systems- Review of Systems  Constitutional: Positive for malaise/fatigue.  Respiratory: Positive for shortness of breath.   Gastrointestinal:       Dysphagia  Neurological: Positive for weakness.       Allergies  Allergen Reactions  . Codeine Nausea And Vomiting     Past Medical History:  Diagnosis Date  . Allergic rhinitis   . Anxiety   . Asthma   . Attention deficit disorder without mention of hyperactivity   . Calculus of kidney   . Cataract   . Diffuse cystic mastopathy   . High cholesterol   . History of kidney stones   . Hypoglycemia, unspecified   . Migraine, unspecified, without mention of intractable migraine without mention of status migrainosus   . Mixed hyperlipidemia   . Osteopenia   . Precancerous lesion   . Unspecified essential hypertension      Past Surgical History:  Procedure Laterality Date  . ABDOMINAL HYSTERECTOMY     10/2002  . APPENDECTOMY    . COLONOSCOPY  5/06  . COLONOSCOPY WITH PROPOFOL N/A 05/12/2015   Procedure: COLONOSCOPY WITH PROPOFOL;  Surgeon: Manya Silvas, MD;  Location: [facility] ENDOSCOPY;  Service: Endoscopy;  Laterality: N/A;  . COLONOSCOPY WITH PROPOFOL N/A 09/27/2016   Procedure: COLONOSCOPY WITH PROPOFOL;  Surgeon: Manya Silvas, MD;  Location: [facility] ENDOSCOPY;  Service: Endoscopy;  Laterality: N/A;  . EYE SURGERY     multiple; s/p childhood trauma  . LITHOTRIPSY    . 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 PERIPHERAL VASCULAR CATHETERIZATION N/A 11/04/2016   Procedure: Glori Luis Cath Insertion;  Surgeon: Algernon Huxley, MD;   Location: [facility];  Service: Cardiovascular;  Laterality: N/A;  . TONSILLECTOMY      Social History   Social History  . Marital status: Married    Spouse name: N/A  . Number of children: 2  . Years of education: N/A   Occupational History  . Retired    Social History Main Topics  . Smoking status: Never Smoker  . Smokeless tobacco: Never Used  . Alcohol use 0.6 oz/week    1 Shots of liquor per week     Comment: occasional last  month  . Drug use: No  . Sexual activity: No   Other Topics Concern  . 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 Diabetes Father   . Colon cancer Father   . Hypertension Father   . Heart attack Father   . Heart attack Mother   . Coronary artery disease Mother   . Pancreatic cancer Mother   . Hyperlipidemia Mother   . Other Brother     Congenital hyperspadias  .  Other Brother     loss of kidney function in 1 kidney early in life  . Diabetes      Grandmother  . Coronary artery disease Maternal Grandmother   . Coronary artery disease      Maternal aunts and uncles  . Breast cancer Neg Hx   . Ovarian cancer Neg Hx   . Uterine cancer Neg Hx      Current Outpatient Prescriptions:  .  amLODipine (NORVASC) 5 MG tablet, Take 1 tablet (5 mg total) by mouth daily., Disp: 90 tablet, Rfl: 3 .  cetirizine (ZYRTEC) 10 MG tablet, Take 10 mg by mouth daily.  , Disp: , Rfl:  .  cholecalciferol (VITAMIN D) 1000 UNITS tablet, Take 1,000 Units by mouth daily.  , Disp: , Rfl:  .  Coenzyme Q10 (COQ10) 400 MG CAPS, Take 400 mg by mouth daily. , Disp: , Rfl:  .  fluconazole (DIFLUCAN) 100 MG tablet, Take 1 tablet (100 mg total) by mouth daily. Take 2 tablets on day one and then 1 tablet daily x 3 weeks, Disp: 23 tablet, Rfl: 0 .  fluticasone (FLONASE) 50 MCG/ACT nasal  spray, Place 2 sprays into both nostrils daily., Disp: 48 g, Rfl: 3 .  lidocaine-prilocaine (EMLA) cream, Apply 1 application topically as needed. Apply to port 2 hours prior to appt, Disp: 30 g, Rfl: 2 .  LUMIGAN 0.01 % SOLN, Place 1 drop into the right eye at bedtime., Disp: , Rfl: 0 .  Multiple Vitamin (MULTIVITAMIN) tablet, Take 1 tablet by mouth daily.  , Disp: , Rfl:  .  nystatin (MYCOSTATIN) 100000 UNIT/ML suspension, take 5 milliliters by mouth four times a day, Disp: 120 mL, Rfl: 1 .  ondansetron (ZOFRAN-ODT) 4 MG disintegrating tablet, Take 1 tablet (4 mg total) by mouth every 8 (eight) hours as needed for nausea or vomiting., Disp: 20 tablet, Rfl: 0 .  prochlorperazine (COMPAZINE) 10 MG tablet, take 1 tablet by mouth every 6 hours if needed for nausea and vomiting, Disp: 30 tablet, Rfl: 0 .  rosuvastatin (CRESTOR) 20 MG tablet, Take one and one half tablets by mouth once daily for cholesterol (Patient taking differently: Take 30 mg by mouth daily. ), Disp: 135 tablet, Rfl: 3 .  venlafaxine XR (EFFEXOR-XR) 75 MG 24 hr capsule, take 1 capsule by mouth once daily, Disp: 90 capsule, Rfl: 3 .  cyclobenzaprine (FLEXERIL) 5 MG tablet, Take 1 tablet (5 mg total) by mouth 3 (three) times daily as needed for muscle spasms. (Patient not taking: Reported on 02/01/2017), Disp: 30 tablet, Rfl: 0 .  Flaxseed, Linseed, (FLAX SEED OIL) 1000 MG CAPS, Take 1 capsule by mouth daily., Disp: , Rfl:  .  HYDROcodone-acetaminophen (NORCO/VICODIN) 5-325 MG tablet, Take 1-2 tablets by mouth every 6 (six) hours as needed. (Patient not taking: Reported on 02/01/2017), Disp: 40 tablet, Rfl: 0 .  lidocaine (LIDODERM) 5 %, Place 1 patch onto the skin daily. Remove & Discard patch within 12 hours of use each day (Patient not taking: Reported on 02/01/2017), Disp: 30 patch, Rfl: 0 .  morphine (MS CONTIN) 15 MG 12 hr tablet, Take 15 mg by mouth every 12 (twelve) hours., Disp: , Rfl: 0 .  morphine (MSIR) 15 MG tablet, take 1  tablet by mouth every 4 hours if needed for severe pain, Disp: , Rfl: 0 .  naloxone (NARCAN) nasal spray 4 mg/0.1 mL, Place 1 spray into the nose daily as needed (opioid overdose)., Disp: , Rfl:  .  nitrofurantoin,  macrocrystal-monohydrate, (MACROBID) 100 MG capsule, Take 1 capsule (100 mg total) by mouth 2 (two) times daily. (Patient not taking: Reported on 02/01/2017), Disp: 14 capsule, Rfl: 0 .  Omega-3 Fatty Acids (FISH OIL) 1200 MG CAPS, Take 1,200 mg by mouth daily. , Disp: , Rfl:  .  ondansetron (ZOFRAN) 4 MG tablet, take 1 tablet by mouth every 6 hours if needed for nausea and vomiting, Disp: , Rfl: 0 .  Oxycodone HCl 10 MG TABS, take 77m by mouth twice a day if needed for pain, Disp: , Rfl: 0 .  Probiotic Product (PROBIOTIC DAILY PO), Take 1 capsule by mouth daily., Disp: , Rfl:  No current facility-administered medications for this visit.   Facility-Administered Medications Ordered in Other Visits:  .  heparin lock flush 100 unit/mL, 500 Units, Intravenous, Once, ASindy Guadeloupe MD .  heparin lock flush 100 unit/mL, 500 Units, Intracatheter, Once PRN, ASindy Guadeloupe MD .  sodium chloride flush (NS) 0.9 % injection 10 mL, 10 mL, Intravenous, PRN, ASindy Guadeloupe MD, 10 mL at 02/01/17 0844 .  sodium chloride flush (NS) 0.9 % injection 10 mL, 10 mL, Intracatheter, PRN, ASindy Guadeloupe MD  Physical exam:  Vitals:   02/01/17 0912  BP: 102/69  Pulse: 100  Resp: 20  Temp: 99.9 F (37.7 C)  TempSrc: Tympanic  Weight: 141 lb 9.6 oz (64.2 kg)   Physical Exam  Constitutional: She is oriented to person, place, and time.  Sitting in a wheelchair, appears fatigued  HENT:  Head: Normocephalic and atraumatic.  White ulcerated patch over hard palapte appears significantly better. No areas of active oozing or bleeding. Coated tongue  Eyes: EOM are normal. Pupils are equal, round, and reactive to light.  Neck: Normal range of motion.  Cardiovascular: Normal rate, regular rhythm and normal heart  sounds.   Pulmonary/Chest: Effort normal and breath sounds normal.  Abdominal: Soft. Bowel sounds are normal.  Neurological: She is alert and oriented to person, place, and time.  Skin: Skin is warm and dry.  There is a solid erythematous indurated nodule over right lower back that appears to be superficial skin infection. No abscess     CMP Latest Ref Rng & Units 02/01/2017  Glucose 65 - 99 mg/dL 242(H)  BUN 6 - 20 mg/dL 13  Creatinine 0.44 - 1.00 mg/dL 1.49(H)  Sodium 135 - 145 mmol/L 134(L)  Potassium 3.5 - 5.1 mmol/L 3.4(L)  Chloride 101 - 111 mmol/L 101  CO2 22 - 32 mmol/L 21(L)  Calcium 8.9 - 10.3 mg/dL 8.3(L)  Total Protein 6.5 - 8.1 g/dL 6.9  Total Bilirubin 0.3 - 1.2 mg/dL 0.8  Alkaline Phos 38 - 126 U/L 568(H)  AST 15 - 41 U/L 39  ALT 14 - 54 U/L 22   CBC Latest Ref Rng & Units 02/01/2017  WBC 3.6 - 11.0 K/uL 24.7(H)  Hemoglobin 12.0 - 16.0 g/dL 9.2(L)  Hematocrit 35.0 - 47.0 % 26.4(L)  Platelets 150 - 440 K/uL 281    No images are attached to the encounter.  No results found.   Assessment and plan- Patient is a 66y.o. female with high-grade metastatic neuroendocrine carcinoma with metastases to the liver, bones and LN  1. Counts ok to proceed with chemotherapy. However patent is significantly fatigued and wishes to skip chemotherapy this week. She will get zometa today and 1L IVF. I will see her next week with lab work and see if she can get chemo then. I am concerned about  her decline in performance status. She has cancer related fatigue and SOB likely from malignancy as well. I will obtain repeat scans after next cycle of chemotherapy. She may not be able to tolerate chemotherapy in the future  2. Skin infection- continue warm compresses. Does not appear infected. T mas 100.9 at home. Hold off on antibiotics  3. Hypokalemia- replace   Visit Diagnosis 1. High grade neuroendocrine carcinoma ([place])   2. Bone metastases ([place])      Dr. Randa Evens, MD, MPH [facility]  at [facility] Pager- [phone] 02/01/2017 11:37 AM

## 2017-02-02 ENCOUNTER — Inpatient Hospital Stay: Payer: Medicare Other

## 2017-02-03 ENCOUNTER — Inpatient Hospital Stay: Payer: Medicare Other

## 2017-02-03 ENCOUNTER — Ambulatory Visit: Payer: Medicare Other | Admitting: Radiation Oncology

## 2017-02-08 ENCOUNTER — Inpatient Hospital Stay: Payer: Medicare Other | Attending: Oncology | Admitting: Oncology

## 2017-02-08 ENCOUNTER — Inpatient Hospital Stay: Payer: Medicare Other

## 2017-02-08 ENCOUNTER — Encounter: Payer: Self-pay | Admitting: Oncology

## 2017-02-08 VITALS — BP 102/68 | HR 84 | Temp 96.6°F | Resp 18 | Wt 138.4 lb

## 2017-02-08 DIAGNOSIS — M25551 Pain in right hip: Secondary | ICD-10-CM | POA: Insufficient documentation

## 2017-02-08 DIAGNOSIS — Z7189 Other specified counseling: Secondary | ICD-10-CM

## 2017-02-08 DIAGNOSIS — R630 Anorexia: Secondary | ICD-10-CM | POA: Insufficient documentation

## 2017-02-08 DIAGNOSIS — G893 Neoplasm related pain (acute) (chronic): Secondary | ICD-10-CM | POA: Insufficient documentation

## 2017-02-08 DIAGNOSIS — Z86010 Personal history of colonic polyps: Secondary | ICD-10-CM | POA: Insufficient documentation

## 2017-02-08 DIAGNOSIS — C7A1 Malignant poorly differentiated neuroendocrine tumors: Secondary | ICD-10-CM

## 2017-02-08 DIAGNOSIS — I10 Essential (primary) hypertension: Secondary | ICD-10-CM

## 2017-02-08 DIAGNOSIS — C7951 Secondary malignant neoplasm of bone: Secondary | ICD-10-CM | POA: Insufficient documentation

## 2017-02-08 DIAGNOSIS — R7989 Other specified abnormal findings of blood chemistry: Secondary | ICD-10-CM | POA: Insufficient documentation

## 2017-02-08 DIAGNOSIS — C778 Secondary and unspecified malignant neoplasm of lymph nodes of multiple regions: Secondary | ICD-10-CM | POA: Diagnosis not present

## 2017-02-08 DIAGNOSIS — Z79899 Other long term (current) drug therapy: Secondary | ICD-10-CM

## 2017-02-08 DIAGNOSIS — Z5111 Encounter for antineoplastic chemotherapy: Secondary | ICD-10-CM | POA: Insufficient documentation

## 2017-02-08 DIAGNOSIS — R200 Anesthesia of skin: Secondary | ICD-10-CM | POA: Diagnosis not present

## 2017-02-08 DIAGNOSIS — Z7952 Long term (current) use of systemic steroids: Secondary | ICD-10-CM | POA: Insufficient documentation

## 2017-02-08 DIAGNOSIS — R5383 Other fatigue: Secondary | ICD-10-CM | POA: Insufficient documentation

## 2017-02-08 DIAGNOSIS — T451X5A Adverse effect of antineoplastic and immunosuppressive drugs, initial encounter: Secondary | ICD-10-CM

## 2017-02-08 DIAGNOSIS — R5381 Other malaise: Secondary | ICD-10-CM | POA: Insufficient documentation

## 2017-02-08 DIAGNOSIS — M8580 Other specified disorders of bone density and structure, unspecified site: Secondary | ICD-10-CM | POA: Insufficient documentation

## 2017-02-08 DIAGNOSIS — K640 First degree hemorrhoids: Secondary | ICD-10-CM | POA: Insufficient documentation

## 2017-02-08 DIAGNOSIS — M25552 Pain in left hip: Secondary | ICD-10-CM | POA: Insufficient documentation

## 2017-02-08 DIAGNOSIS — R531 Weakness: Secondary | ICD-10-CM | POA: Insufficient documentation

## 2017-02-08 DIAGNOSIS — T451X5S Adverse effect of antineoplastic and immunosuppressive drugs, sequela: Secondary | ICD-10-CM | POA: Insufficient documentation

## 2017-02-08 DIAGNOSIS — Z87442 Personal history of urinary calculi: Secondary | ICD-10-CM | POA: Insufficient documentation

## 2017-02-08 DIAGNOSIS — R112 Nausea with vomiting, unspecified: Secondary | ICD-10-CM | POA: Insufficient documentation

## 2017-02-08 DIAGNOSIS — E782 Mixed hyperlipidemia: Secondary | ICD-10-CM | POA: Insufficient documentation

## 2017-02-08 DIAGNOSIS — C787 Secondary malignant neoplasm of liver and intrahepatic bile duct: Secondary | ICD-10-CM | POA: Insufficient documentation

## 2017-02-08 LAB — CBC WITH DIFFERENTIAL/PLATELET
Basophils Absolute: 0.1 10*3/uL (ref 0–0.1)
Basophils Relative: 0 %
Eosinophils Absolute: 0 10*3/uL (ref 0–0.7)
Eosinophils Relative: 0 %
HCT: 27 % — ABNORMAL LOW (ref 35.0–47.0)
Hemoglobin: 8.9 g/dL — ABNORMAL LOW (ref 12.0–16.0)
Lymphocytes Relative: 2 %
Lymphs Abs: 0.7 10*3/uL — ABNORMAL LOW (ref 1.0–3.6)
MCH: 29.3 pg (ref 26.0–34.0)
MCHC: 33 g/dL (ref 32.0–36.0)
MCV: 88.6 fL (ref 80.0–100.0)
Monocytes Absolute: 1.6 10*3/uL — ABNORMAL HIGH (ref 0.2–0.9)
Monocytes Relative: 4 %
Neutro Abs: 35.5 10*3/uL — ABNORMAL HIGH (ref 1.4–6.5)
Neutrophils Relative %: 94 %
Platelets: 358 10*3/uL (ref 150–440)
RBC: 3.05 MIL/uL — ABNORMAL LOW (ref 3.80–5.20)
RDW: 19.1 % — ABNORMAL HIGH (ref 11.5–14.5)
WBC: 38 10*3/uL — ABNORMAL HIGH (ref 3.6–11.0)

## 2017-02-08 LAB — COMPREHENSIVE METABOLIC PANEL
ALT: 37 U/L (ref 14–54)
AST: 79 U/L — ABNORMAL HIGH (ref 15–41)
Albumin: 2.7 g/dL — ABNORMAL LOW (ref 3.5–5.0)
Alkaline Phosphatase: 738 U/L — ABNORMAL HIGH (ref 38–126)
Anion gap: 14 (ref 5–15)
BUN: 15 mg/dL (ref 6–20)
CO2: 15 mmol/L — ABNORMAL LOW (ref 22–32)
Calcium: 8.4 mg/dL — ABNORMAL LOW (ref 8.9–10.3)
Chloride: 105 mmol/L (ref 101–111)
Creatinine, Ser: 1.61 mg/dL — ABNORMAL HIGH (ref 0.44–1.00)
GFR calc Af Amer: 38 mL/min — ABNORMAL LOW (ref >60)
GFR calc non Af Amer: 33 mL/min — ABNORMAL LOW (ref >60)
Glucose, Bld: 229 mg/dL — ABNORMAL HIGH (ref 65–99)
Potassium: 3.6 mmol/L (ref 3.5–5.1)
Sodium: 134 mmol/L — ABNORMAL LOW (ref 135–145)
Total Bilirubin: 0.7 mg/dL (ref 0.3–1.2)
Total Protein: 7.3 g/dL (ref 6.5–8.1)

## 2017-02-08 MED ORDER — SODIUM CHLORIDE 0.9 % IV SOLN: 10.0000 mg | Freq: Once | INTRAVENOUS | Status: DC

## 2017-02-08 MED ORDER — MEGESTROL ACETATE 400 MG/10ML PO SUSP: 400.0000 mg | Freq: Every day | ORAL | 0 refills | Status: AC

## 2017-02-08 MED ORDER — PALONOSETRON HCL INJECTION 0.25 MG/5ML: 0.2500 mg | Freq: Once | INTRAVENOUS | Status: DC

## 2017-02-08 MED ORDER — SODIUM CHLORIDE 0.9 % IV SOLN
307.0000 mg | Freq: Once | INTRAVENOUS | Status: AC
  Administered 2017-02-08: 310 mg via INTRAVENOUS
  Filled 2017-02-08: qty 31

## 2017-02-08 MED ORDER — OLANZAPINE 10 MG PO TABS: 10.0000 mg | ORAL_TABLET | Freq: Every day | ORAL | 1 refills | Status: AC

## 2017-02-08 MED ORDER — PALONOSETRON HCL INJECTION 0.25 MG/5ML
0.2500 mg | Freq: Once | INTRAVENOUS | Status: AC
  Administered 2017-02-08: 0.25 mg via INTRAVENOUS
  Filled 2017-02-08: qty 5

## 2017-02-08 MED ORDER — PREDNISONE 5 MG PO TABS: 5.0000 mg | ORAL_TABLET | Freq: Every day | ORAL | 0 refills | Status: AC

## 2017-02-08 MED ORDER — ETOPOSIDE CHEMO INJECTION 1 GM/50ML
100.0000 mg/m2 | Freq: Once | INTRAVENOUS | Status: AC
  Administered 2017-02-08: 180 mg via INTRAVENOUS
  Filled 2017-02-08: qty 9

## 2017-02-08 MED ORDER — DEXAMETHASONE SODIUM PHOSPHATE 10 MG/ML IJ SOLN
10.0000 mg | Freq: Once | INTRAMUSCULAR | Status: AC
  Administered 2017-02-08: 10 mg via INTRAVENOUS
  Filled 2017-02-08: qty 1

## 2017-02-08 MED ORDER — HEPARIN SOD (PORK) LOCK FLUSH 100 UNIT/ML IV SOLN
500.0000 [IU] | Freq: Once | INTRAVENOUS | Status: AC | PRN
  Administered 2017-02-08: 500 [IU]
  Filled 2017-02-08: qty 5

## 2017-02-08 MED ORDER — SODIUM CHLORIDE 0.9 % IV SOLN
Freq: Once | INTRAVENOUS | Status: AC
  Administered 2017-02-08: 10:00:00 via INTRAVENOUS
  Filled 2017-02-08: qty 1000

## 2017-02-08 NOTE — Addendum Note (Signed)
Addended by: Luella Cook on: 02/08/2017 09:52 AM   Modules accepted: Orders

## 2017-02-08 NOTE — Progress Notes (Signed)
Here for follow up  Appetite still very poor and feeling weaker

## 2017-02-08 NOTE — Progress Notes (Signed)
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 patient is a 66 year old female follows up with [facility] GI. He has a history of villous adenoma of the rectum in 2005 status post excision. Subsequent colonoscopies have been negative for malignancy.Colonoscopy on 05/12/2015 which showed sessile polyps in the sigmoid colon and hepatic flexure along with grade 1 internal hemorrhoids.   2. She had been having some right-sided abdominal pain and was seen in urgent carein December 2028fr what was thought to be pain from her kidney stones. She was seen by PCP and urology and the kidney stones were thought to be small and nonobstructing and did not feel that this is the cause for abdominal pain   3. She underwent ultrasound of the abdomen for further evaluation on 09/23/2016 which showed a solid mass in the right lobe of the liver measuring 4.8 x 5 x 5 cm. This was followed by a CT abdomen the same day which showed a hypoattenuating/hypoenhancing liver lesions which were new since 2010. Index posterior right hepatic lobe lesion was 4.9 x 4.7 cm,lesion straddling the anterior right and medial left hepatic lobe measures 3.5 x 5.2 cm. Weight left-sided lesions up to 1.2 cm. Suspicion of asymmetric wall thickening in the posterior left side of the rectum. Borderline to mild left inguinal adenopathy of 1 cm and perirectal adenopathy up to 1.8 cm suspicious presacral node measuring 8 mm. New multifocal nauseous lesions including within the inferior  aspect of the L3 vertebral body. Bibasilar pulmonary nodules suspicious for malignancy  4. Patient had a repeat colonoscopy on 09/27/2016 which showed many polyps in the rectum and the rectosigmoid colon which were biopsied. Internal hemorrhoids. Biopsy showed hyperplastic polyps negative for dysplasia and malignancy  5. PET CT scan showed widespread metastatic disease with hypermetabolism in the mediastinal and left hilar lymph nodes. Multifocal pulmonary nodules compatible with thoracic metastases. Extensive liver metastases as well as upper abdominal and pelvic lymph node metastases. Widespread nauseous metastatic disease and at least one pathologic fracture involving the L2 vertebra.  6. Ultrasound-guided biopsy of the liver showed high-grade neuroendocrine carcinoma with Ki-67 of 80%. CD 56 positive and synatophysindiffuse moderate staining.  7. Patient received cycle 1 of chemotherapy carboplatin/etoposidefrom 10/18/2016 to 10/20/2016  8. Patient received 1st dose of zometa on 11/03/16  9. Foundation one testing was done which showed: PDL1 expression was 0%. 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 MSI stable.   10. [facility] with carboplatin and etoposide first-line  11. After 2 cycles of chemotherapy she had interimscans which showed response in her liver as well as intra-abdominal adenopathy but worsening bone lesions. There were significant compression fractures throughout her spine as well as impending fractures. She was seen by orthopedic surgery and there was no acute role for surgical management. She was referred to radiation oncology and started her radiation on 12/09/2016  12. Initially the plan was to switch her to second line palliative chemotherapy with FOLFIRI given progression in the bones. However given that she had  a second response in her liver as well as lymph nodes to carbo/etoposide- plan for now is to  continue with carbo/etoposide for 2 more cycles and then to re scan her.   Interval history- appetite remains poor and she is not getting enough PO intake. Feels fatigued and energy levels have marginally improved with steroids. She is reporting some pain in her hips. Chin numbness is improving but persists  ECOG PS- 2-3 Pain scale- 0 Opioid associated constipation- no  Review of systems- Review of Systems  Constitutional: Positive for malaise/fatigue. Negative for chills, fever and weight loss.  HENT: Negative for congestion, ear discharge and nosebleeds.   Eyes: Negative for blurred vision.  Respiratory: Negative for cough, hemoptysis, sputum production, shortness of breath and wheezing.   Cardiovascular: Negative for chest pain, palpitations, orthopnea and claudication.  Gastrointestinal: Negative for abdominal pain, blood in stool, constipation, diarrhea, heartburn, melena, nausea and vomiting.  Genitourinary: Negative for dysuria, flank pain, frequency, hematuria and urgency.  Musculoskeletal: Positive for joint pain. Negative for back pain and myalgias.  Skin: Negative for rash.  Neurological: Positive for weakness. Negative for dizziness, tingling, focal weakness, seizures and headaches.  Endo/Heme/Allergies: Does not bruise/bleed easily.  Psychiatric/Behavioral: Negative for depression and suicidal ideas. The patient does not have insomnia.       Allergies  Allergen Reactions  . Codeine Nausea And Vomiting     Past Medical History:  Diagnosis Date  . Allergic rhinitis   . Anxiety   . Asthma   . Attention deficit disorder without mention of hyperactivity   . Calculus of kidney   . Cataract   . Diffuse cystic mastopathy   . High cholesterol   . History of kidney stones   . Hypoglycemia, unspecified   . Migraine, unspecified, without mention of intractable migraine without mention of status migrainosus   . Mixed hyperlipidemia   . Osteopenia   . Precancerous lesion     . Unspecified essential hypertension      Past Surgical History:  Procedure Laterality Date  . ABDOMINAL HYSTERECTOMY     10/2002  . APPENDECTOMY    . COLONOSCOPY  5/06  . COLONOSCOPY WITH PROPOFOL N/A 05/12/2015   Procedure: COLONOSCOPY WITH PROPOFOL;  Surgeon: Manya Silvas, MD;  Location: [facility] ENDOSCOPY;  Service: Endoscopy;  Laterality: N/A;  . COLONOSCOPY WITH PROPOFOL N/A 09/27/2016   Procedure: COLONOSCOPY WITH PROPOFOL;  Surgeon: Manya Silvas, MD;  Location: [facility] ENDOSCOPY;  Service: Endoscopy;  Laterality: N/A;  . EYE SURGERY     multiple; s/p childhood trauma  . LITHOTRIPSY    . 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 PERIPHERAL VASCULAR CATHETERIZATION N/A 11/04/2016   Procedure: Glori Luis Cath Insertion;  Surgeon: Algernon Huxley, MD;  Location: [place] CV LAB;  Service: Cardiovascular;  Laterality: N/A;  . TONSILLECTOMY      Social History   Social History  . Marital status: Married    Spouse name: N/A  . Number of children: 2  . Years of education: N/A   Occupational History  . Retired    Social History Main Topics  . Smoking status: Never Smoker  . Smokeless tobacco: Never Used  . Alcohol use 0.6 oz/week    1 Shots of liquor per week     Comment: occasional last  month  . Drug use: No  . Sexual activity: No   Other Topics Concern  . 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 Diabetes Father   . Colon cancer Father   . Hypertension Father   . Heart attack Father   . Heart attack Mother   . Coronary artery disease Mother   . Pancreatic cancer Mother   . Hyperlipidemia Mother   . Other Brother     Congenital hyperspadias  . Other Brother     loss of kidney function in 1 kidney early in life  . Diabetes      Grandmother  . Coronary artery disease Maternal  Grandmother   . Coronary artery disease      Maternal aunts and uncles  . Breast cancer Neg Hx   . Ovarian cancer Neg Hx   . Uterine cancer Neg Hx      Current Outpatient Prescriptions:  .  amLODipine (NORVASC) 5 MG tablet, Take 1 tablet (5 mg total) by mouth daily., Disp: 90 tablet, Rfl: 3 .  cetirizine (ZYRTEC) 10 MG tablet, Take 10 mg by mouth daily.  , Disp: , Rfl:  .  cholecalciferol (VITAMIN D) 1000 UNITS tablet, Take 1,000 Units by mouth daily.  , Disp: , Rfl:  .  Coenzyme Q10 (COQ10) 400 MG CAPS, Take 400 mg by mouth daily. , Disp: , Rfl:  .  cyclobenzaprine (FLEXERIL) 5 MG tablet, Take 1 tablet (5 mg total) by mouth 3 (three) times daily as needed for muscle spasms. (Patient not taking: Reported on 02/01/2017), Disp: 30 tablet, Rfl: 0 .  Flaxseed, Linseed, (FLAX SEED OIL) 1000 MG CAPS, Take 1 capsule by mouth daily., Disp: , Rfl:  .  fluconazole (DIFLUCAN) 100 MG tablet, Take 1 tablet (100 mg total) by mouth daily. Take 2 tablets on day one and then 1 tablet daily x 3 weeks, Disp: 23 tablet, Rfl: 0 .  fluticasone (FLONASE) 50 MCG/ACT nasal spray, Place 2 sprays into both nostrils daily., Disp: 48 g, Rfl: 3 .  HYDROcodone-acetaminophen (NORCO/VICODIN) 5-325 MG tablet, Take 1-2 tablets by mouth every 6 (six) hours as needed. (Patient not taking: Reported on 02/01/2017), Disp: 40 tablet, Rfl: 0 .  lidocaine (LIDODERM) 5 %, Place 1 patch onto the skin daily. Remove & Discard patch within 12 hours of use each day (Patient not taking: Reported on 02/01/2017), Disp: 30 patch, Rfl: 0 .  lidocaine-prilocaine (EMLA) cream, Apply 1 application topically as needed. Apply to port 2 hours prior to appt, Disp: 30 g, Rfl: 2 .  LUMIGAN 0.01 % SOLN, Place 1 drop into the right eye at bedtime., Disp: , Rfl: 0 .  morphine (MS CONTIN) 15 MG 12 hr tablet, Take 15 mg by mouth every 12 (twelve) hours., Disp: , Rfl: 0 .  morphine (MSIR) 15 MG tablet, take 1 tablet by mouth every 4 hours if needed for severe  pain, Disp: , Rfl: 0 .  Multiple Vitamin (MULTIVITAMIN) tablet, Take 1 tablet by mouth daily.  , Disp: , Rfl:  .  naloxone (NARCAN) nasal spray 4 mg/0.1 mL, Place 1 spray into the nose daily as needed (opioid overdose)., Disp: , Rfl:  .  nitrofurantoin, macrocrystal-monohydrate, (MACROBID) 100 MG capsule, Take 1 capsule (100 mg total) by mouth 2 (two) times daily. (Patient not taking: Reported on 02/01/2017), Disp: 14 capsule, Rfl: 0 .  nystatin (MYCOSTATIN) 100000 UNIT/ML suspension, take  5 milliliters by mouth four times a day, Disp: 120 mL, Rfl: 1 .  Omega-3 Fatty Acids (FISH OIL) 1200 MG CAPS, Take 1,200 mg by mouth daily. , Disp: , Rfl:  .  ondansetron (ZOFRAN) 4 MG tablet, take 1 tablet by mouth every 6 hours if needed for nausea and vomiting, Disp: , Rfl: 0 .  ondansetron (ZOFRAN-ODT) 4 MG disintegrating tablet, Take 1 tablet (4 mg total) by mouth every 8 (eight) hours as needed for nausea or vomiting., Disp: 20 tablet, Rfl: 0 .  Oxycodone HCl 10 MG TABS, take 48m by mouth twice a day if needed for pain, Disp: , Rfl: 0 .  Probiotic Product (PROBIOTIC DAILY PO), Take 1 capsule by mouth daily., Disp: , Rfl:  .  prochlorperazine (COMPAZINE) 10 MG tablet, take 1 tablet by mouth every 6 hours if needed for nausea and vomiting, Disp: 30 tablet, Rfl: 0 .  rosuvastatin (CRESTOR) 20 MG tablet, Take one and one half tablets by mouth once daily for cholesterol (Patient taking differently: Take 30 mg by mouth daily. ), Disp: 135 tablet, Rfl: 3 .  venlafaxine XR (EFFEXOR-XR) 75 MG 24 hr capsule, take 1 capsule by mouth once daily, Disp: 90 capsule, Rfl: 3  Physical exam:  Vitals:   02/08/17 0853  BP: 102/68  Pulse: 84  Resp: 18  Temp: (!) 96.6 F (35.9 C)  TempSrc: Tympanic  Weight: 138 lb 6.4 oz (62.8 kg)   Physical Exam  Constitutional: She is oriented to person, place, and time.  Fatigued, sitting in a wheelchair  HENT:  Head: Normocephalic and atraumatic.  Eyes: EOM are normal. Pupils are  equal, round, and reactive to light.  Neck: Normal range of motion.  Cardiovascular: Normal rate, regular rhythm and normal heart sounds.   Pulmonary/Chest: Effort normal and breath sounds normal.  Abdominal: Soft. Bowel sounds are normal.  Neurological: She is alert and oriented to person, place, and time.  Skin: Skin is warm and dry.     CMP Latest Ref Rng & Units 02/01/2017  Glucose 65 - 99 mg/dL 242(H)  BUN 6 - 20 mg/dL 13  Creatinine 0.44 - 1.00 mg/dL 1.49(H)  Sodium 135 - 145 mmol/L 134(L)  Potassium 3.5 - 5.1 mmol/L 3.4(L)  Chloride 101 - 111 mmol/L 101  CO2 22 - 32 mmol/L 21(L)  Calcium 8.9 - 10.3 mg/dL 8.3(L)  Total Protein 6.5 - 8.1 g/dL 6.9  Total Bilirubin 0.3 - 1.2 mg/dL 0.8  Alkaline Phos 38 - 126 U/L 568(H)  AST 15 - 41 U/L 39  ALT 14 - 54 U/L 22   CBC Latest Ref Rng & Units 02/08/2017  WBC 3.6 - 11.0 K/uL 38.0(H)  Hemoglobin 12.0 - 16.0 g/dL 8.9(L)  Hematocrit 35.0 - 47.0 % 27.0(L)  Platelets 150 - 440 K/uL 358     Assessment and plan- Patient is a 66y.o. female with high-grade metastatic neuroendocrine carcinoma with metastases to the liver, bones and LN   Counts are ok to proceed with cycle 4 of chemotherapy today. Overall atients performance status is declining. Her alk phos is steadily worsening and likely her bone mets are getting worse. I will plan to get PET/CT to assess her response after 4 cycles of chemotherapy. If theer is evidence of progression, we might have to consider best supportive care over treatment given worsening performance status. Given her chin numbness and worsening weakness, I will also obtain MRI brain to r/o metastatic disease given she has high grade neuroendocrine carcinoma  Neoplasm related pain- I have encouraged her to use prn oxycodone instead of nsaids  Nausea-- likelt from malignancy and chemo. Will add olanzapine 10 mg QHS. Continue prn zofran  Poor appetite from malignancy- will give a trial of megace. Continue 5 mg  prednisone  Oral thrush- now resolved with diflucan  I will see her back in 10 days after her scans   Visit Diagnosis 1. Bone metastases ([place])   2. High grade neuroendocrine carcinoma ([place])   3. Neoplasm related pain   4. Chemotherapy induced nausea and vomiting   5. Poor appetite      Dr. Randa Evens, MD, MPH [facility] at [facility] Pager- [phone] 02/08/2017 9:31 AM

## 2017-02-09 ENCOUNTER — Inpatient Hospital Stay: Payer: Medicare Other

## 2017-02-09 ENCOUNTER — Other Ambulatory Visit: Payer: Self-pay | Admitting: *Deleted

## 2017-02-09 VITALS — BP 117/79 | HR 120 | Temp 99.6°F | Resp 18

## 2017-02-09 DIAGNOSIS — G893 Neoplasm related pain (acute) (chronic): Secondary | ICD-10-CM

## 2017-02-09 DIAGNOSIS — C7A1 Malignant poorly differentiated neuroendocrine tumors: Secondary | ICD-10-CM

## 2017-02-09 DIAGNOSIS — R112 Nausea with vomiting, unspecified: Secondary | ICD-10-CM

## 2017-02-09 DIAGNOSIS — Z7189 Other specified counseling: Secondary | ICD-10-CM

## 2017-02-09 MED ORDER — DEXAMETHASONE SODIUM PHOSPHATE 10 MG/ML IJ SOLN
10.0000 mg | Freq: Once | INTRAMUSCULAR | Status: AC
  Administered 2017-02-09: 10 mg via INTRAVENOUS
  Filled 2017-02-09: qty 1

## 2017-02-09 MED ORDER — SODIUM CHLORIDE 0.9 % IV SOLN
100.0000 mg/m2 | Freq: Once | INTRAVENOUS | Status: AC
  Administered 2017-02-09: 180 mg via INTRAVENOUS
  Filled 2017-02-09: qty 9

## 2017-02-09 MED ORDER — HEPARIN SOD (PORK) LOCK FLUSH 100 UNIT/ML IV SOLN
500.0000 [IU] | Freq: Once | INTRAVENOUS | Status: AC | PRN
  Administered 2017-02-09: 500 [IU]
  Filled 2017-02-09: qty 5

## 2017-02-09 MED ORDER — OXYCODONE HCL 10 MG PO TABS: 10.0000 mg | ORAL_TABLET | Freq: Four times a day (QID) | ORAL | 0 refills | Status: AC | PRN

## 2017-02-09 MED ORDER — SODIUM CHLORIDE 0.9 % IV SOLN
Freq: Once | INTRAVENOUS | Status: AC
  Administered 2017-02-09: 14:00:00 via INTRAVENOUS
  Filled 2017-02-09: qty 1000

## 2017-02-10 ENCOUNTER — Inpatient Hospital Stay: Payer: Medicare Other | Attending: Oncology

## 2017-02-10 VITALS — BP 119/76 | HR 118 | Temp 98.7°F | Resp 18

## 2017-02-10 DIAGNOSIS — Z970 Presence of artificial eye: Secondary | ICD-10-CM

## 2017-02-10 DIAGNOSIS — R739 Hyperglycemia, unspecified: Secondary | ICD-10-CM | POA: Diagnosis not present

## 2017-02-10 DIAGNOSIS — C787 Secondary malignant neoplasm of liver and intrahepatic bile duct: Secondary | ICD-10-CM | POA: Diagnosis present

## 2017-02-10 DIAGNOSIS — E871 Hypo-osmolality and hyponatremia: Secondary | ICD-10-CM | POA: Diagnosis present

## 2017-02-10 DIAGNOSIS — Z7189 Other specified counseling: Secondary | ICD-10-CM

## 2017-02-10 DIAGNOSIS — C7951 Secondary malignant neoplasm of bone: Secondary | ICD-10-CM | POA: Diagnosis present

## 2017-02-10 DIAGNOSIS — I2119 ST elevation (STEMI) myocardial infarction involving other coronary artery of inferior wall: Secondary | ICD-10-CM | POA: Diagnosis present

## 2017-02-10 DIAGNOSIS — N390 Urinary tract infection, site not specified: Secondary | ICD-10-CM | POA: Diagnosis present

## 2017-02-10 DIAGNOSIS — I4891 Unspecified atrial fibrillation: Secondary | ICD-10-CM | POA: Diagnosis present

## 2017-02-10 DIAGNOSIS — N170 Acute kidney failure with tubular necrosis: Secondary | ICD-10-CM | POA: Diagnosis present

## 2017-02-10 DIAGNOSIS — Z923 Personal history of irradiation: Secondary | ICD-10-CM

## 2017-02-10 DIAGNOSIS — D649 Anemia, unspecified: Secondary | ICD-10-CM | POA: Diagnosis present

## 2017-02-10 DIAGNOSIS — N183 Chronic kidney disease, stage 3 (moderate): Secondary | ICD-10-CM | POA: Diagnosis present

## 2017-02-10 DIAGNOSIS — M8580 Other specified disorders of bone density and structure, unspecified site: Secondary | ICD-10-CM | POA: Diagnosis present

## 2017-02-10 DIAGNOSIS — J9601 Acute respiratory failure with hypoxia: Secondary | ICD-10-CM | POA: Diagnosis present

## 2017-02-10 DIAGNOSIS — Z800 Family history of malignant neoplasm of digestive organs: Secondary | ICD-10-CM

## 2017-02-10 DIAGNOSIS — E875 Hyperkalemia: Secondary | ICD-10-CM | POA: Diagnosis present

## 2017-02-10 DIAGNOSIS — C7A1 Malignant poorly differentiated neuroendocrine tumors: Secondary | ICD-10-CM | POA: Diagnosis present

## 2017-02-10 DIAGNOSIS — D696 Thrombocytopenia, unspecified: Secondary | ICD-10-CM | POA: Diagnosis present

## 2017-02-10 DIAGNOSIS — J449 Chronic obstructive pulmonary disease, unspecified: Secondary | ICD-10-CM | POA: Diagnosis present

## 2017-02-10 DIAGNOSIS — Z8249 Family history of ischemic heart disease and other diseases of the circulatory system: Secondary | ICD-10-CM

## 2017-02-10 DIAGNOSIS — Z515 Encounter for palliative care: Secondary | ICD-10-CM | POA: Diagnosis present

## 2017-02-10 DIAGNOSIS — Z79891 Long term (current) use of opiate analgesic: Secondary | ICD-10-CM

## 2017-02-10 DIAGNOSIS — Z87442 Personal history of urinary calculi: Secondary | ICD-10-CM

## 2017-02-10 DIAGNOSIS — I5021 Acute systolic (congestive) heart failure: Secondary | ICD-10-CM | POA: Diagnosis present

## 2017-02-10 DIAGNOSIS — Z66 Do not resuscitate: Secondary | ICD-10-CM | POA: Diagnosis present

## 2017-02-10 DIAGNOSIS — C775 Secondary and unspecified malignant neoplasm of intrapelvic lymph nodes: Secondary | ICD-10-CM | POA: Diagnosis present

## 2017-02-10 DIAGNOSIS — Z90710 Acquired absence of both cervix and uterus: Secondary | ICD-10-CM

## 2017-02-10 DIAGNOSIS — F419 Anxiety disorder, unspecified: Secondary | ICD-10-CM | POA: Diagnosis present

## 2017-02-10 DIAGNOSIS — Z9221 Personal history of antineoplastic chemotherapy: Secondary | ICD-10-CM

## 2017-02-10 DIAGNOSIS — A419 Sepsis, unspecified organism: Principal | ICD-10-CM | POA: Diagnosis present

## 2017-02-10 DIAGNOSIS — J9811 Atelectasis: Secondary | ICD-10-CM | POA: Diagnosis present

## 2017-02-10 DIAGNOSIS — R945 Abnormal results of liver function studies: Secondary | ICD-10-CM | POA: Diagnosis present

## 2017-02-10 DIAGNOSIS — R6521 Severe sepsis with septic shock: Secondary | ICD-10-CM | POA: Diagnosis present

## 2017-02-10 DIAGNOSIS — E872 Acidosis: Secondary | ICD-10-CM | POA: Diagnosis present

## 2017-02-10 DIAGNOSIS — C778 Secondary and unspecified malignant neoplasm of lymph nodes of multiple regions: Secondary | ICD-10-CM | POA: Insufficient documentation

## 2017-02-10 DIAGNOSIS — Z79818 Long term (current) use of other agents affecting estrogen receptors and estrogen levels: Secondary | ICD-10-CM

## 2017-02-10 DIAGNOSIS — Z79899 Other long term (current) drug therapy: Secondary | ICD-10-CM

## 2017-02-10 DIAGNOSIS — E782 Mixed hyperlipidemia: Secondary | ICD-10-CM | POA: Diagnosis present

## 2017-02-10 DIAGNOSIS — R34 Anuria and oliguria: Secondary | ICD-10-CM | POA: Diagnosis present

## 2017-02-10 DIAGNOSIS — I313 Pericardial effusion (noninflammatory): Secondary | ICD-10-CM | POA: Diagnosis present

## 2017-02-10 DIAGNOSIS — Z833 Family history of diabetes mellitus: Secondary | ICD-10-CM

## 2017-02-10 DIAGNOSIS — Z7952 Long term (current) use of systemic steroids: Secondary | ICD-10-CM

## 2017-02-10 DIAGNOSIS — I130 Hypertensive heart and chronic kidney disease with heart failure and stage 1 through stage 4 chronic kidney disease, or unspecified chronic kidney disease: Secondary | ICD-10-CM | POA: Diagnosis present

## 2017-02-10 DIAGNOSIS — G9341 Metabolic encephalopathy: Secondary | ICD-10-CM | POA: Diagnosis present

## 2017-02-10 MED ORDER — DEXAMETHASONE SODIUM PHOSPHATE 10 MG/ML IJ SOLN
10.0000 mg | Freq: Once | INTRAMUSCULAR | Status: AC
  Administered 2017-02-10: 10 mg via INTRAVENOUS
  Filled 2017-02-10: qty 1

## 2017-02-10 MED ORDER — SODIUM CHLORIDE 0.9 % IV SOLN
Freq: Once | INTRAVENOUS | Status: AC
Start: 2017-02-10 — End: 2017-02-10
  Administered 2017-02-10: 14:00:00 via INTRAVENOUS
  Filled 2017-02-10: qty 1000

## 2017-02-10 MED ORDER — SODIUM CHLORIDE 0.9 % IV SOLN
100.0000 mg/m2 | Freq: Once | INTRAVENOUS | Status: AC
  Administered 2017-02-10: 180 mg via INTRAVENOUS
  Filled 2017-02-10: qty 9

## 2017-02-10 MED ORDER — HEPARIN SOD (PORK) LOCK FLUSH 100 UNIT/ML IV SOLN
500.0000 [IU] | Freq: Once | INTRAVENOUS | Status: AC | PRN
  Administered 2017-02-10: 500 [IU]
  Filled 2017-02-10: qty 5

## 2017-02-11 ENCOUNTER — Other Ambulatory Visit: Payer: Self-pay | Admitting: *Deleted

## 2017-02-11 DIAGNOSIS — C7951 Secondary malignant neoplasm of bone: Secondary | ICD-10-CM

## 2017-02-11 DIAGNOSIS — R062 Wheezing: Secondary | ICD-10-CM

## 2017-02-11 DIAGNOSIS — C7A1 Malignant poorly differentiated neuroendocrine tumors: Secondary | ICD-10-CM

## 2017-02-11 MED ORDER — AZITHROMYCIN 250 MG PO TABS: ORAL_TABLET | ORAL | 0 refills | Status: AC

## 2017-02-11 MED ORDER — ALBUTEROL SULFATE HFA 108 (90 BASE) MCG/ACT IN AERS: 2.0000 | INHALATION_SPRAY | Freq: Four times a day (QID) | RESPIRATORY_TRACT | 2 refills | Status: AC | PRN

## 2017-02-12 ENCOUNTER — Inpatient Hospital Stay (HOSPITAL_COMMUNITY)
Admit: 2017-02-12 | Discharge: 2017-02-12 | Disposition: A | Discharge status: Home / Self Care | Payer: Medicare Other | Attending: Pulmonary Disease | Admitting: Pulmonary Disease

## 2017-02-12 ENCOUNTER — Other Ambulatory Visit: Payer: Self-pay

## 2017-02-12 ENCOUNTER — Inpatient Hospital Stay
Admission: EM | Admit: 2017-02-12 | Discharge: 2017-03-11 | DRG: 871 | Disposition: E | Payer: Medicare Other | Attending: Internal Medicine | Admitting: Internal Medicine

## 2017-02-12 ENCOUNTER — Emergency Department: Payer: Medicare Other

## 2017-02-12 ENCOUNTER — Encounter: Admission: EM | Disposition: E | Payer: Self-pay | Source: Home / Self Care | Attending: Internal Medicine

## 2017-02-12 DIAGNOSIS — J9601 Acute respiratory failure with hypoxia: Secondary | ICD-10-CM

## 2017-02-12 DIAGNOSIS — R6520 Severe sepsis without septic shock: Secondary | ICD-10-CM | POA: Diagnosis not present

## 2017-02-12 DIAGNOSIS — R6521 Severe sepsis with septic shock: Secondary | ICD-10-CM | POA: Diagnosis present

## 2017-02-12 DIAGNOSIS — R200 Anesthesia of skin: Secondary | ICD-10-CM | POA: Diagnosis not present

## 2017-02-12 DIAGNOSIS — I313 Pericardial effusion (noninflammatory): Secondary | ICD-10-CM

## 2017-02-12 DIAGNOSIS — I4891 Unspecified atrial fibrillation: Secondary | ICD-10-CM | POA: Diagnosis present

## 2017-02-12 DIAGNOSIS — Z515 Encounter for palliative care: Secondary | ICD-10-CM | POA: Diagnosis present

## 2017-02-12 DIAGNOSIS — J9690 Respiratory failure, unspecified, unspecified whether with hypoxia or hypercapnia: Secondary | ICD-10-CM

## 2017-02-12 DIAGNOSIS — N179 Acute kidney failure, unspecified: Secondary | ICD-10-CM | POA: Diagnosis not present

## 2017-02-12 DIAGNOSIS — E875 Hyperkalemia: Secondary | ICD-10-CM | POA: Diagnosis not present

## 2017-02-12 DIAGNOSIS — N183 Chronic kidney disease, stage 3 (moderate): Secondary | ICD-10-CM | POA: Diagnosis present

## 2017-02-12 DIAGNOSIS — J9600 Acute respiratory failure, unspecified whether with hypoxia or hypercapnia: Secondary | ICD-10-CM | POA: Diagnosis not present

## 2017-02-12 DIAGNOSIS — C778 Secondary and unspecified malignant neoplasm of lymph nodes of multiple regions: Secondary | ICD-10-CM | POA: Diagnosis not present

## 2017-02-12 DIAGNOSIS — C775 Secondary and unspecified malignant neoplasm of intrapelvic lymph nodes: Secondary | ICD-10-CM | POA: Diagnosis present

## 2017-02-12 DIAGNOSIS — Z923 Personal history of irradiation: Secondary | ICD-10-CM | POA: Diagnosis not present

## 2017-02-12 DIAGNOSIS — I2119 ST elevation (STEMI) myocardial infarction involving other coronary artery of inferior wall: Secondary | ICD-10-CM

## 2017-02-12 DIAGNOSIS — I5021 Acute systolic (congestive) heart failure: Secondary | ICD-10-CM | POA: Diagnosis present

## 2017-02-12 DIAGNOSIS — R579 Shock, unspecified: Secondary | ICD-10-CM

## 2017-02-12 DIAGNOSIS — N390 Urinary tract infection, site not specified: Secondary | ICD-10-CM | POA: Diagnosis present

## 2017-02-12 DIAGNOSIS — A419 Sepsis, unspecified organism: Secondary | ICD-10-CM | POA: Diagnosis present

## 2017-02-12 DIAGNOSIS — E872 Acidosis: Secondary | ICD-10-CM | POA: Diagnosis not present

## 2017-02-12 DIAGNOSIS — Z66 Do not resuscitate: Secondary | ICD-10-CM | POA: Diagnosis present

## 2017-02-12 DIAGNOSIS — R4789 Other speech disturbances: Secondary | ICD-10-CM | POA: Diagnosis not present

## 2017-02-12 DIAGNOSIS — C7B8 Other secondary neuroendocrine tumors: Secondary | ICD-10-CM | POA: Diagnosis not present

## 2017-02-12 DIAGNOSIS — C787 Secondary malignant neoplasm of liver and intrahepatic bile duct: Secondary | ICD-10-CM | POA: Diagnosis not present

## 2017-02-12 DIAGNOSIS — D649 Anemia, unspecified: Secondary | ICD-10-CM | POA: Diagnosis not present

## 2017-02-12 DIAGNOSIS — I318 Other specified diseases of pericardium: Secondary | ICD-10-CM | POA: Diagnosis not present

## 2017-02-12 DIAGNOSIS — I959 Hypotension, unspecified: Secondary | ICD-10-CM | POA: Diagnosis not present

## 2017-02-12 DIAGNOSIS — J9811 Atelectasis: Secondary | ICD-10-CM | POA: Diagnosis present

## 2017-02-12 DIAGNOSIS — E871 Hypo-osmolality and hyponatremia: Secondary | ICD-10-CM | POA: Diagnosis present

## 2017-02-12 DIAGNOSIS — G9341 Metabolic encephalopathy: Secondary | ICD-10-CM | POA: Diagnosis present

## 2017-02-12 DIAGNOSIS — N170 Acute kidney failure with tubular necrosis: Secondary | ICD-10-CM | POA: Diagnosis present

## 2017-02-12 DIAGNOSIS — C7A1 Malignant poorly differentiated neuroendocrine tumors: Secondary | ICD-10-CM | POA: Diagnosis not present

## 2017-02-12 DIAGNOSIS — C7951 Secondary malignant neoplasm of bone: Secondary | ICD-10-CM | POA: Diagnosis not present

## 2017-02-12 DIAGNOSIS — I130 Hypertensive heart and chronic kidney disease with heart failure and stage 1 through stage 4 chronic kidney disease, or unspecified chronic kidney disease: Secondary | ICD-10-CM | POA: Diagnosis present

## 2017-02-12 HISTORY — DX: Other benign neuroendocrine tumors: D3A.8

## 2017-02-12 LAB — CBC WITH DIFFERENTIAL/PLATELET
BASOS PCT: 0 %
Basophils Absolute: 0 10*3/uL (ref 0–0.1)
EOS PCT: 0 %
Eosinophils Absolute: 0 10*3/uL (ref 0–0.7)
HCT: 21.8 % — ABNORMAL LOW (ref 35.0–47.0)
Hemoglobin: 7 g/dL — ABNORMAL LOW (ref 12.0–16.0)
Lymphocytes Relative: 0 %
Lymphs Abs: 0 10*3/uL — ABNORMAL LOW (ref 1.0–3.6)
MCH: 28.9 pg (ref 26.0–34.0)
MCHC: 32 g/dL (ref 32.0–36.0)
MCV: 90.3 fL (ref 80.0–100.0)
MONO ABS: 0 10*3/uL — AB (ref 0.2–0.9)
Monocytes Relative: 0 %
Neutro Abs: 25.8 10*3/uL — ABNORMAL HIGH (ref 1.4–6.5)
Neutrophils Relative %: 100 %
PLATELETS: 234 10*3/uL (ref 150–440)
RBC: 2.42 MIL/uL — ABNORMAL LOW (ref 3.80–5.20)
RDW: 20.5 % — AB (ref 11.5–14.5)
Smear Review: ADEQUATE
WBC: 25.8 10*3/uL — ABNORMAL HIGH (ref 3.6–11.0)

## 2017-02-12 LAB — APTT: aPTT: 33 seconds (ref 24–36)

## 2017-02-12 LAB — RENAL FUNCTION PANEL
ALBUMIN: 2.1 g/dL — AB (ref 3.5–5.0)
ALBUMIN: 1.9 g/dL — AB (ref 3.5–5.0)
ANION GAP: 16 — AB (ref 5–15)
Albumin: 2.1 g/dL — ABNORMAL LOW (ref 3.5–5.0)
Anion gap: 17 — ABNORMAL HIGH (ref 5–15)
Anion gap: 17 — ABNORMAL HIGH (ref 5–15)
BUN: 64 mg/dL — ABNORMAL HIGH (ref 6–20)
BUN: 60 mg/dL — ABNORMAL HIGH (ref 6–20)
BUN: 45 mg/dL — AB (ref 6–20)
CALCIUM: 6.9 mg/dL — AB (ref 8.9–10.3)
CALCIUM: 6.8 mg/dL — AB (ref 8.9–10.3)
CHLORIDE: 102 mmol/L (ref 101–111)
CO2: 10 mmol/L — ABNORMAL LOW (ref 22–32)
CO2: 15 mmol/L — ABNORMAL LOW (ref 22–32)
CO2: 17 mmol/L — ABNORMAL LOW (ref 22–32)
CREATININE: 3.34 mg/dL — AB (ref 0.44–1.00)
CREATININE: 2.53 mg/dL — AB (ref 0.44–1.00)
Calcium: 7 mg/dL — ABNORMAL LOW (ref 8.9–10.3)
Chloride: 99 mmol/L — ABNORMAL LOW (ref 101–111)
Chloride: 97 mmol/L — ABNORMAL LOW (ref 101–111)
Creatinine, Ser: 3.79 mg/dL — ABNORMAL HIGH (ref 0.44–1.00)
GFR calc Af Amer: 13 mL/min — ABNORMAL LOW (ref >60)
GFR calc Af Amer: 22 mL/min — ABNORMAL LOW (ref >60)
GFR calc non Af Amer: 19 mL/min — ABNORMAL LOW (ref >60)
GFR, EST AFRICAN AMERICAN: 16 mL/min — AB (ref >60)
GFR, EST NON AFRICAN AMERICAN: 12 mL/min — AB (ref >60)
GFR, EST NON AFRICAN AMERICAN: 13 mL/min — AB (ref >60)
GLUCOSE: 102 mg/dL — AB (ref 65–99)
Glucose, Bld: 334 mg/dL — ABNORMAL HIGH (ref 65–99)
Glucose, Bld: 222 mg/dL — ABNORMAL HIGH (ref 65–99)
PHOSPHORUS: 8.4 mg/dL — AB (ref 2.5–4.6)
PHOSPHORUS: 6 mg/dL — AB (ref 2.5–4.6)
PHOSPHORUS: 4.7 mg/dL — AB (ref 2.5–4.6)
POTASSIUM: 6.5 mmol/L — AB (ref 3.5–5.1)
Potassium: 4.9 mmol/L (ref 3.5–5.1)
Potassium: 4 mmol/L (ref 3.5–5.1)
SODIUM: 130 mmol/L — AB (ref 135–145)
SODIUM: 131 mmol/L — AB (ref 135–145)
Sodium: 129 mmol/L — ABNORMAL LOW (ref 135–145)

## 2017-02-12 LAB — URINALYSIS, COMPLETE (UACMP) WITH MICROSCOPIC
Bilirubin Urine: NEGATIVE
KETONES UR: NEGATIVE mg/dL
NITRITE: NEGATIVE
PROTEIN: 100 mg/dL — AB
Specific Gravity, Urine: 1.011 (ref 1.005–1.030)
pH: 7 (ref 5.0–8.0)

## 2017-02-12 LAB — COMPREHENSIVE METABOLIC PANEL
ALK PHOS: 392 U/L — AB (ref 38–126)
ALT: 27 U/L (ref 14–54)
AST: 71 U/L — ABNORMAL HIGH (ref 15–41)
Albumin: 2.2 g/dL — ABNORMAL LOW (ref 3.5–5.0)
Anion gap: 17 — ABNORMAL HIGH (ref 5–15)
BUN: 66 mg/dL — AB (ref 6–20)
CALCIUM: 7.1 mg/dL — AB (ref 8.9–10.3)
CO2: 9 mmol/L — AB (ref 22–32)
CREATININE: 4.1 mg/dL — AB (ref 0.44–1.00)
Chloride: 102 mmol/L (ref 101–111)
GFR, EST AFRICAN AMERICAN: 12 mL/min — AB (ref >60)
GFR, EST NON AFRICAN AMERICAN: 11 mL/min — AB (ref >60)
Glucose, Bld: 315 mg/dL — ABNORMAL HIGH (ref 65–99)
Potassium: 6.8 mmol/L (ref 3.5–5.1)
SODIUM: 128 mmol/L — AB (ref 135–145)
Total Bilirubin: 0.7 mg/dL (ref 0.3–1.2)
Total Protein: 6.4 g/dL — ABNORMAL LOW (ref 6.5–8.1)

## 2017-02-12 LAB — BASIC METABOLIC PANEL
Anion gap: 16 — ABNORMAL HIGH (ref 5–15)
BUN: 64 mg/dL — AB (ref 6–20)
CO2: 11 mmol/L — ABNORMAL LOW (ref 22–32)
Calcium: 7 mg/dL — ABNORMAL LOW (ref 8.9–10.3)
Chloride: 102 mmol/L (ref 101–111)
Creatinine, Ser: 3.75 mg/dL — ABNORMAL HIGH (ref 0.44–1.00)
GFR calc Af Amer: 14 mL/min — ABNORMAL LOW (ref >60)
GFR, EST NON AFRICAN AMERICAN: 12 mL/min — AB (ref >60)
GLUCOSE: 345 mg/dL — AB (ref 65–99)
POTASSIUM: 6.6 mmol/L — AB (ref 3.5–5.1)
Sodium: 129 mmol/L — ABNORMAL LOW (ref 135–145)

## 2017-02-12 LAB — LIPID PANEL
CHOLESTEROL: 105 mg/dL (ref 0–200)
HDL: 20 mg/dL — ABNORMAL LOW (ref >40)
LDL Cholesterol: 43 mg/dL (ref 0–99)
Total CHOL/HDL Ratio: 5.3 RATIO
Triglycerides: 211 mg/dL — ABNORMAL HIGH (ref <150)
VLDL: 42 mg/dL — AB (ref 0–40)

## 2017-02-12 LAB — CBC
HCT: 34.1 % — ABNORMAL LOW (ref 35.0–47.0)
HEMATOCRIT: 34.6 % — AB (ref 35.0–47.0)
HEMOGLOBIN: 11.7 g/dL — AB (ref 12.0–16.0)
Hemoglobin: 11.8 g/dL — ABNORMAL LOW (ref 12.0–16.0)
MCH: 27.3 pg (ref 26.0–34.0)
MCH: 27.1 pg (ref 26.0–34.0)
MCHC: 34 g/dL (ref 32.0–36.0)
MCHC: 34.3 g/dL (ref 32.0–36.0)
MCV: 80.2 fL (ref 80.0–100.0)
MCV: 79 fL — AB (ref 80.0–100.0)
PLATELETS: 135 10*3/uL — AB (ref 150–440)
Platelets: 121 10*3/uL — ABNORMAL LOW (ref 150–440)
RBC: 4.31 MIL/uL (ref 3.80–5.20)
RBC: 4.33 MIL/uL (ref 3.80–5.20)
RDW: 20.5 % — ABNORMAL HIGH (ref 11.5–14.5)
RDW: 19.7 % — ABNORMAL HIGH (ref 11.5–14.5)
WBC: 13.5 10*3/uL — AB (ref 3.6–11.0)
WBC: 9.6 10*3/uL (ref 3.6–11.0)

## 2017-02-12 LAB — URIC ACID: URIC ACID, SERUM: 6.1 mg/dL (ref 2.3–6.6)

## 2017-02-12 LAB — BLOOD GAS, ARTERIAL
Acid-base deficit: 20.5 mmol/L — ABNORMAL HIGH (ref 0.0–2.0)
Bicarbonate: 9.2 mmol/L — ABNORMAL LOW (ref 20.0–28.0)
FIO2: 0.44
O2 Saturation: 87.4 %
Patient temperature: 37
pCO2 arterial: 34 mmHg (ref 32.0–48.0)
pH, Arterial: 7.04 — CL (ref 7.350–7.450)
pO2, Arterial: 78 mmHg — ABNORMAL LOW (ref 83.0–108.0)

## 2017-02-12 LAB — MAGNESIUM
Magnesium: 2.4 mg/dL (ref 1.7–2.4)
Magnesium: 2.1 mg/dL (ref 1.7–2.4)
Magnesium: 1.8 mg/dL (ref 1.7–2.4)

## 2017-02-12 LAB — BRAIN NATRIURETIC PEPTIDE: B Natriuretic Peptide: 461 pg/mL — ABNORMAL HIGH (ref 0.0–100.0)

## 2017-02-12 LAB — LACTIC ACID, PLASMA
Lactic Acid, Venous: 3 mmol/L (ref 0.5–1.9)
Lactic Acid, Venous: 2.7 mmol/L (ref 0.5–1.9)

## 2017-02-12 LAB — TROPONIN I
TROPONIN I: 0.13 ng/mL — AB (ref <0.03)
Troponin I: 0.07 ng/mL (ref <0.03)
Troponin I: 0.07 ng/mL (ref <0.03)

## 2017-02-12 LAB — PROTIME-INR
INR: 1.61
Prothrombin Time: 19.3 seconds — ABNORMAL HIGH (ref 11.4–15.2)

## 2017-02-12 LAB — GLUCOSE, CAPILLARY
Glucose-Capillary: 237 mg/dL — ABNORMAL HIGH (ref 65–99)
Glucose-Capillary: 119 mg/dL — ABNORMAL HIGH (ref 65–99)

## 2017-02-12 LAB — MRSA PCR SCREENING: MRSA BY PCR: NEGATIVE

## 2017-02-12 LAB — PHOSPHORUS: PHOSPHORUS: 4.6 mg/dL (ref 2.5–4.6)

## 2017-02-12 LAB — LACTATE DEHYDROGENASE: LDH: 890 U/L — ABNORMAL HIGH (ref 98–192)

## 2017-02-12 LAB — PREPARE RBC (CROSSMATCH)

## 2017-02-12 SURGERY — LEFT HEART CATH AND CORONARY ANGIOGRAPHY
Anesthesia: Moderate Sedation

## 2017-02-12 MED ORDER — HEPARIN SODIUM (PORCINE) 5000 UNIT/ML IJ SOLN
5000.0000 [IU] | Freq: Three times a day (TID) | INTRAMUSCULAR | Status: DC
  Administered 2017-02-12 – 2017-02-13 (×2): 5000 [IU] via SUBCUTANEOUS
  Filled 2017-02-12 (×2): qty 1

## 2017-02-12 MED ORDER — DEXTROSE 5 % IV SOLN
2.0000 g | Freq: Two times a day (BID) | INTRAVENOUS | Status: DC
  Administered 2017-02-12 – 2017-02-13 (×3): 2 g via INTRAVENOUS
  Filled 2017-02-12 (×5): qty 2

## 2017-02-12 MED ORDER — DEXTROSE 50 % IV SOLN
1.0000 | Freq: Once | INTRAVENOUS | Status: AC
  Administered 2017-02-12: 50 mL via INTRAVENOUS
  Filled 2017-02-12: qty 50

## 2017-02-12 MED ORDER — SODIUM CHLORIDE 0.9 % IV BOLUS (SEPSIS)
1000.0000 mL | Freq: Once | INTRAVENOUS | Status: AC
  Administered 2017-02-12: 1000 mL via INTRAVENOUS

## 2017-02-12 MED ORDER — DEXTROSE 5 % IV SOLN
INTRAVENOUS | Status: DC
  Administered 2017-02-12: 11:00:00 via INTRAVENOUS
  Filled 2017-02-12 (×2): qty 100

## 2017-02-12 MED ORDER — PUREFLOW DIALYSIS SOLUTION
INTRAVENOUS | Status: DC
  Administered 2017-02-12: 16:00:00 via INTRAVENOUS_CENTRAL

## 2017-02-12 MED ORDER — FLUTICASONE PROPIONATE 50 MCG/ACT NA SUSP: 2.0000 | Freq: Every day | NASAL | Status: DC

## 2017-02-12 MED ORDER — VANCOMYCIN HCL IN DEXTROSE 1-5 GM/200ML-% IV SOLN
1000.0000 mg | Freq: Once | INTRAVENOUS | Status: AC
  Administered 2017-02-12: 1000 mg via INTRAVENOUS
  Filled 2017-02-12: qty 200

## 2017-02-12 MED ORDER — CALCIUM GLUCONATE 10 % IV SOLN
1.0000 g | Freq: Once | INTRAVENOUS | Status: AC
  Administered 2017-02-12: 1 g via INTRAVENOUS
  Filled 2017-02-12: qty 10

## 2017-02-12 MED ORDER — SODIUM CHLORIDE 0.9 % IV SOLN: 10.0000 mL/h | Freq: Once | INTRAVENOUS | Status: DC

## 2017-02-12 MED ORDER — DEXTROSE 5 % IV SOLN
500.0000 mg | Freq: Once | INTRAVENOUS | Status: AC
  Administered 2017-02-12: 500 mg via INTRAVENOUS
  Filled 2017-02-12: qty 500

## 2017-02-12 MED ORDER — ASPIRIN 81 MG PO CHEW
324.0000 mg | CHEWABLE_TABLET | Freq: Once | ORAL | Status: AC
  Administered 2017-02-12: 324 mg via ORAL

## 2017-02-12 MED ORDER — SODIUM BICARBONATE 8.4 % IV SOLN
50.0000 meq | Freq: Once | INTRAVENOUS | Status: AC
  Administered 2017-02-12: 50 meq via INTRAVENOUS
  Filled 2017-02-12: qty 50

## 2017-02-12 MED ORDER — ACETAMINOPHEN 325 MG PO TABS: 650.0000 mg | ORAL_TABLET | Freq: Four times a day (QID) | ORAL | Status: DC | PRN

## 2017-02-12 MED ORDER — IPRATROPIUM-ALBUTEROL 0.5-2.5 (3) MG/3ML IN SOLN
3.0000 mL | Freq: Once | RESPIRATORY_TRACT | Status: AC
  Administered 2017-02-12: 3 mL via RESPIRATORY_TRACT
  Filled 2017-02-12: qty 3

## 2017-02-12 MED ORDER — IPRATROPIUM-ALBUTEROL 0.5-2.5 (3) MG/3ML IN SOLN
3.0000 mL | Freq: Four times a day (QID) | RESPIRATORY_TRACT | Status: DC
  Administered 2017-02-12 – 2017-02-13 (×5): 3 mL via RESPIRATORY_TRACT
  Filled 2017-02-12 (×5): qty 3

## 2017-02-12 MED ORDER — PUREFLOW DIALYSIS SOLUTION
INTRAVENOUS | Status: DC
  Administered 2017-02-12: 3 via INTRAVENOUS_CENTRAL

## 2017-02-12 MED ORDER — AMIODARONE HCL IN DEXTROSE 360-4.14 MG/200ML-% IV SOLN
30.0000 mg/h | INTRAVENOUS | Status: DC
  Administered 2017-02-13 – 2017-02-14 (×3): 30 mg/h via INTRAVENOUS
  Filled 2017-02-12 (×3): qty 200

## 2017-02-12 MED ORDER — ENOXAPARIN SODIUM 40 MG/0.4ML ~~LOC~~ SOLN: 40.0000 mg | SUBCUTANEOUS | Status: DC

## 2017-02-12 MED ORDER — HEPARIN SODIUM (PORCINE) 5000 UNIT/ML IJ SOLN
4000.0000 [IU] | Freq: Once | INTRAMUSCULAR | Status: AC
  Administered 2017-02-12: 4000 [IU] via INTRAVENOUS

## 2017-02-12 MED ORDER — INSULIN ASPART 100 UNIT/ML ~~LOC~~ SOLN
10.0000 [IU] | Freq: Once | SUBCUTANEOUS | Status: AC
  Administered 2017-02-12: 10 [IU] via INTRAVENOUS
  Filled 2017-02-12: qty 10

## 2017-02-12 MED ORDER — METHYLPREDNISOLONE SODIUM SUCC 125 MG IJ SOLR: 60.0000 mg | Freq: Four times a day (QID) | INTRAMUSCULAR | Status: DC

## 2017-02-12 MED ORDER — NOREPINEPHRINE BITARTRATE 1 MG/ML IV SOLN
0.0000 ug/min | Freq: Once | INTRAVENOUS | Status: AC
  Administered 2017-02-12: 8 ug/min via INTRAVENOUS
  Filled 2017-02-12: qty 16

## 2017-02-12 MED ORDER — STERILE WATER FOR INJECTION IV SOLN
INTRAVENOUS | Status: DC
  Administered 2017-02-12 – 2017-02-13 (×2): via INTRAVENOUS
  Filled 2017-02-12 (×4): qty 850

## 2017-02-12 MED ORDER — NOREPINEPHRINE BITARTRATE 1 MG/ML IV SOLN
0.0000 ug/min | Freq: Once | INTRAVENOUS | Status: AC
  Administered 2017-02-12: 2 ug/min via INTRAVENOUS

## 2017-02-12 MED ORDER — AMIODARONE HCL IN DEXTROSE 360-4.14 MG/200ML-% IV SOLN
60.0000 mg/h | INTRAVENOUS | Status: AC
  Filled 2017-02-12 (×2): qty 200

## 2017-02-12 MED ORDER — PANTOPRAZOLE SODIUM 40 MG IV SOLR
40.0000 mg | INTRAVENOUS | Status: DC
  Administered 2017-02-12 – 2017-02-13 (×2): 40 mg via INTRAVENOUS
  Filled 2017-02-12 (×2): qty 40

## 2017-02-12 MED ORDER — SODIUM CHLORIDE 0.9 % IV SOLN: INTRAVENOUS | Status: DC

## 2017-02-12 MED ORDER — HEPARIN SODIUM (PORCINE) 1000 UNIT/ML DIALYSIS: 1000.0000 [IU] | INTRAMUSCULAR | Status: DC | PRN

## 2017-02-12 MED ORDER — LATANOPROST 0.005 % OP SOLN
1.0000 [drp] | Freq: Every day | OPHTHALMIC | Status: DC
  Administered 2017-02-12 – 2017-02-13 (×2): 1 [drp] via OPHTHALMIC
  Filled 2017-02-12 (×2): qty 2.5

## 2017-02-12 MED ORDER — SODIUM CHLORIDE 0.9% FLUSH: 10.0000 mL | INTRAVENOUS | Status: DC | PRN

## 2017-02-12 MED ORDER — VANCOMYCIN HCL IN DEXTROSE 1-5 GM/200ML-% IV SOLN
1000.0000 mg | INTRAVENOUS | Status: DC
  Administered 2017-02-13: 1000 mg via INTRAVENOUS
  Filled 2017-02-12 (×2): qty 200

## 2017-02-12 MED ORDER — ONDANSETRON HCL 4 MG/2ML IJ SOLN: 4.0000 mg | Freq: Four times a day (QID) | INTRAMUSCULAR | Status: DC | PRN

## 2017-02-12 MED ORDER — NOREPINEPHRINE BITARTRATE 1 MG/ML IV SOLN
0.0000 ug/min | Freq: Once | INTRAVENOUS | Status: AC
  Administered 2017-02-12: 5 ug/min via INTRAVENOUS
  Filled 2017-02-12: qty 4

## 2017-02-12 MED ORDER — ONDANSETRON HCL 4 MG PO TABS: 4.0000 mg | ORAL_TABLET | Freq: Four times a day (QID) | ORAL | Status: DC | PRN

## 2017-02-12 MED ORDER — ORAL CARE MOUTH RINSE: 15.0000 mL | Freq: Two times a day (BID) | OROMUCOSAL | Status: DC

## 2017-02-12 MED ORDER — AMIODARONE IV BOLUS ONLY 150 MG/100ML
150.0000 mg | Freq: Once | INTRAVENOUS | Status: AC
  Administered 2017-02-12: 150 mg via INTRAVENOUS
  Filled 2017-02-12: qty 100

## 2017-02-12 MED ORDER — LORAZEPAM 2 MG/ML IJ SOLN
0.5000 mg | INTRAMUSCULAR | Status: DC | PRN
  Administered 2017-02-13: 0.5 mg via INTRAVENOUS
  Administered 2017-02-13: 1 mg via INTRAVENOUS
  Filled 2017-02-12 (×2): qty 1

## 2017-02-12 MED ORDER — HEPARIN (PORCINE) IN NACL 100-0.45 UNIT/ML-% IJ SOLN
800.0000 [IU]/h | Freq: Once | INTRAMUSCULAR | Status: AC
  Administered 2017-02-12: 800 [IU]/h via INTRAVENOUS
  Filled 2017-02-12: qty 250

## 2017-02-12 MED ORDER — FENTANYL CITRATE (PF) 100 MCG/2ML IJ SOLN: 50.0000 ug | Freq: Once | INTRAMUSCULAR | Status: DC

## 2017-02-12 MED ORDER — DEXTROSE 5 % IV SOLN
2.0000 g | Freq: Once | INTRAVENOUS | Status: AC
  Administered 2017-02-12: 2 g via INTRAVENOUS
  Filled 2017-02-12: qty 2

## 2017-02-12 MED ORDER — CEFEPIME HCL 2 G IJ SOLR
2.0000 g | Freq: Once | INTRAMUSCULAR | Status: DC
  Filled 2017-02-12: qty 2

## 2017-02-12 MED ORDER — INSULIN ASPART 100 UNIT/ML ~~LOC~~ SOLN
0.0000 [IU] | SUBCUTANEOUS | Status: DC
  Administered 2017-02-12: 8 [IU] via SUBCUTANEOUS
  Filled 2017-02-12: qty 8

## 2017-02-12 MED ORDER — HYDROCORTISONE NA SUCCINATE PF 100 MG IJ SOLR
50.0000 mg | Freq: Four times a day (QID) | INTRAMUSCULAR | Status: DC
  Administered 2017-02-12 – 2017-02-13 (×3): 50 mg via INTRAVENOUS
  Filled 2017-02-12 (×3): qty 2

## 2017-02-12 MED ORDER — ACETAMINOPHEN 650 MG RE SUPP: 650.0000 mg | Freq: Four times a day (QID) | RECTAL | Status: DC | PRN

## 2017-02-12 MED ORDER — AMIODARONE LOAD VIA INFUSION
150.0000 mg | Freq: Once | INTRAVENOUS | Status: AC
  Administered 2017-02-12: 150 mg via INTRAVENOUS
  Filled 2017-02-12: qty 83.34

## 2017-02-12 MED ORDER — DEXTROSE 5 % IV SOLN: 2.0000 g | INTRAVENOUS | Status: DC

## 2017-02-12 MED ORDER — CHLORHEXIDINE GLUCONATE 0.12 % MT SOLN
15.0000 mL | Freq: Two times a day (BID) | OROMUCOSAL | Status: DC
  Administered 2017-02-12: 15 mL via OROMUCOSAL

## 2017-02-12 MED ORDER — SODIUM CHLORIDE 0.9% FLUSH
10.0000 mL | Freq: Two times a day (BID) | INTRAVENOUS | Status: DC
  Administered 2017-02-12: 10 mL
  Administered 2017-02-13: 30 mL
  Administered 2017-02-13: 20 mL

## 2017-02-12 MED ORDER — BUDESONIDE 0.25 MG/2ML IN SUSP
0.2500 mg | Freq: Four times a day (QID) | RESPIRATORY_TRACT | Status: DC
  Administered 2017-02-12: 0.25 mg via RESPIRATORY_TRACT

## 2017-02-12 MED ORDER — BUDESONIDE 0.25 MG/2ML IN SUSP
0.2500 mg | Freq: Four times a day (QID) | RESPIRATORY_TRACT | Status: DC
  Administered 2017-02-12 – 2017-02-13 (×4): 0.25 mg via RESPIRATORY_TRACT
  Filled 2017-02-12 (×5): qty 2

## 2017-02-12 NOTE — ED Notes (Signed)
Informed consent for blood received and signed by husband and put in chart.

## 2017-02-12 NOTE — ED Notes (Signed)
Starting BiPap

## 2017-02-12 NOTE — ED Notes (Addendum)
Cath lab reports ready for patient. Pt about to be transported to be cath lab, per MD, decision has not been made yet.

## 2017-02-12 NOTE — Consult Note (Addendum)
PULMONARY / CRITICAL CARE MEDICINE   Name: Sharon Hester MRN: [id number] DOB: Jun 02, 1951    ADMISSION DATE:  03/08/2017  PT PROFILE:   66 y.o. 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 Presented to ED via EMS with 3 days of progressive dyspnea, weakness, and altered mental status. On the morning of admission, she as in severe distress with profound lethargy. In the ED, she was profoundly hypotensive with multiple metabolic derangements and organ dysfunction. She received 3 units RBCs in ED for Hgb 7. Her EKG was consistent with STEMI but she was not deemed a candidate for LHC   MAJOR EVENTS/TEST RESULTS: 05/05 Admission as above. Extensive discussions re: goals of care. Full aggressive support including ACLS and intubation if needed desired by family 05/05 Received 3 units RBCs. Heparin infusion initiated for possible AMI. Vasopressors initiated. HD cath placed and CRRT initiated 05/05 Echocardiogram:   INDWELLING DEVICES:: R sided port-a-cath 05/05 L femoral HD cath >>    MICRO DATA: MRSA PCR 05/05 >>  Blood 05/05 >>   ANTIMICROBIALS:  Vanc 05/05 >>  Cefepime 05/05 >>    HISTORY OF PRESENT ILLNESS:   As above. Pt unable to provide history.   PAST MEDICAL HISTORY :  She  has a past medical history of Allergic rhinitis; Anxiety; Asthma; Attention deficit disorder without mention of hyperactivity; Calculus of kidney; Cataract; Diffuse cystic mastopathy; High cholesterol; History of kidney stones; Hypoglycemia, unspecified; Migraine, unspecified, without mention of intractable migraine without mention of status migrainosus; Mixed hyperlipidemia; Neuroendocrine tumor; Osteopenia; Precancerous lesion; and Unspecified essential hypertension.  PAST SURGICAL HISTORY: She  has a past surgical history that includes Eye surgery; Appendectomy; Lithotripsy; Tonsillectomy; Mass excision (8/05); Colonoscopy (5/06);  Colonoscopy with propofol (N/A, 05/12/2015); Colonoscopy with propofol (N/A, 09/27/2016); Abdominal hysterectomy; and Cardiac catheterization (N/A, 11/04/2016).  Allergies  Allergen Reactions  . Codeine Nausea And Vomiting    No current facility-administered medications on file prior to encounter.    Current Outpatient Prescriptions on File Prior to Encounter  Medication Sig  . albuterol (PROVENTIL HFA;VENTOLIN HFA) 108 (90 Base) MCG/ACT inhaler Inhale 2 puffs into the lungs every 6 (six) hours as needed for wheezing or shortness of breath.  Marland Kitchen amLODipine (NORVASC) 5 MG tablet Take 1 tablet (5 mg total) by mouth daily.  Marland Kitchen azithromycin (ZITHROMAX Z-PAK) 250 MG tablet Take 2 the first day and then 1 daily until completed  . cetirizine (ZYRTEC) 10 MG tablet Take 10 mg by mouth daily.    . cholecalciferol (VITAMIN D) 1000 UNITS tablet Take 1,000 Units by mouth daily.    . Coenzyme Q10 (COQ10) 400 MG CAPS Take 400 mg by mouth daily.   . Flaxseed, Linseed, (FLAX SEED OIL) 1000 MG CAPS Take 1 capsule by mouth daily.  . fluconazole (DIFLUCAN) 100 MG tablet Take 1 tablet (100 mg total) by mouth daily. Take 2 tablets on day one and then 1 tablet daily x 3 weeks  . lidocaine (LIDODERM) 5 % Place 1 patch onto the skin daily. Remove & Discard patch within 12 hours of use each day  . lidocaine-prilocaine (EMLA) cream Apply 1 application topically as needed. Apply to port 2 hours prior to appt  . LUMIGAN 0.01 % SOLN Place 1 drop into the right eye at bedtime.  . megestrol (MEGACE) 400 MG/10ML suspension Take 10 mLs (400 mg total) by mouth daily.  . Multiple Vitamin (MULTIVITAMIN) tablet Take 1 tablet by mouth daily.    Marland Kitchen  naloxone (NARCAN) nasal spray 4 mg/0.1 mL Place 1 spray into the nose daily as needed (opioid overdose).  Marland Kitchen OLANZapine (ZYPREXA) 10 MG tablet Take 1 tablet (10 mg total) by mouth at bedtime.  . Omega-3 Fatty Acids (FISH OIL) 1200 MG CAPS Take 1,200 mg by mouth daily.   . ondansetron (ZOFRAN)  4 MG tablet take 1 tablet by mouth every 6 hours if needed for nausea and vomiting  . ondansetron (ZOFRAN-ODT) 4 MG disintegrating tablet Take 1 tablet (4 mg total) by mouth every 8 (eight) hours as needed for nausea or vomiting.  . Oxycodone HCl 10 MG TABS Take 1 tablet (10 mg total) by mouth every 6 (six) hours as needed.  . predniSONE (DELTASONE) 5 MG tablet Take 1 tablet (5 mg total) by mouth daily with breakfast.  . Probiotic Product (PROBIOTIC DAILY PO) Take 1 capsule by mouth daily.  . prochlorperazine (COMPAZINE) 10 MG tablet take 1 tablet by mouth every 6 hours if needed for nausea and vomiting  . rosuvastatin (CRESTOR) 20 MG tablet Take one and one half tablets by mouth once daily for cholesterol (Patient taking differently: Take 30 mg by mouth daily. )  . venlafaxine XR (EFFEXOR-XR) 75 MG 24 hr capsule take 1 capsule by mouth once daily  . cyclobenzaprine (FLEXERIL) 5 MG tablet Take 1 tablet (5 mg total) by mouth 3 (three) times daily as needed for muscle spasms. (Patient not taking: Reported on 02/01/2017)  . fluticasone (FLONASE) 50 MCG/ACT nasal spray Place 2 sprays into both nostrils daily. (Patient not taking: Reported on 02/13/2017)  . HYDROcodone-acetaminophen (NORCO/VICODIN) 5-325 MG tablet Take 1-2 tablets by mouth every 6 (six) hours as needed. (Patient not taking: Reported on 02/13/2017)  . nitrofurantoin, macrocrystal-monohydrate, (MACROBID) 100 MG capsule Take 1 capsule (100 mg total) by mouth 2 (two) times daily. (Patient not taking: Reported on 02/08/2017)  . nystatin (MYCOSTATIN) 100000 UNIT/ML suspension take 5 milliliters by mouth four times a day (Patient not taking: Reported on 02/08/2017)    FAMILY HISTORY:  N/C  SOCIAL HISTORY: She  reports that she has never smoked. She has never used smokeless tobacco. She reports that she drinks about 0.6 oz of alcohol per week . She reports that she does not use drugs.  REVIEW OF SYSTEMS:   Level V caveat  SUBJECTIVE:    VITAL  SIGNS: BP 118/79   Pulse (!) 115   Temp 98.4 F (36.9 C)   Resp 15   Ht 5\' 6"  (1.676 m)   Wt 138 lb (62.6 kg)   SpO2 99%   BMI 22.27 kg/m   HEMODYNAMICS:    VENTILATOR SETTINGS:    INTAKE / OUTPUT: No intake/output data recorded.  PHYSICAL EXAMINATION: General: RASS -2, poorly oriented, minimally conversant, chronically ill appearing, moderately dyspneic Neuro: CNs intact, MAEs HEENT: alopecia, NCAT, sclera white Cardiovascular: reg, no M noted Lungs: few scattered wheezes Abdomen: soft, NT, absent BS Ext: cool, pale, cyanotic nail beds, symmetric ankle edema Skin: pallor, no lesions notes  LABS:  BMET  Recent Labs Lab 02/08/17 0834 02/28/2017 0705 02/11/2017 1204  NA 134* 128* 129*  129*  K 3.6 6.8* 6.5*  6.6*  CL 105 102 102  102  CO2 15* 9* 10*  11*  BUN 15 66* 64*  64*  CREATININE 1.61* 4.10* 3.79*  3.75*  GLUCOSE 229* 315* 334*  345*    Electrolytes  Recent Labs Lab 02/08/17 0834 02/11/2017 0705 03/01/2017 1204  CALCIUM 8.4* 7.1* 7.0*  7.0*  MG  --   --  2.4  PHOS  --   --  8.4*    CBC  Recent Labs Lab 02/08/17 0834 02/11/2017 0705  WBC 38.0* 25.8*  HGB 8.9* 7.0*  HCT 27.0* 21.8*  PLT 358 234    Coag's  Recent Labs Lab 02/22/2017 0705  APTT 33  INR 1.61    Sepsis Markers  Recent Labs Lab 02/27/2017 0749 02/27/2017 1040  LATICACIDVEN 3.0* 2.7*    ABG  Recent Labs Lab 02/19/2017 0818  PHART 7.04*  PCO2ART 34  PO2ART 78*    Liver Enzymes  Recent Labs Lab 02/08/17 0834 03/02/2017 0705 03/01/2017 1204  AST 79* 71*  --   ALT 37 27  --   ALKPHOS 738* 392*  --   BILITOT 0.7 0.7  --   ALBUMIN 2.7* 2.2* 2.1*    Cardiac Enzymes  Recent Labs Lab 02/10/2017 0705 02/11/2017 1204  TROPONINI 0.07* 0.13*    Glucose  Recent Labs Lab 02/17/2017 1604  GLUCAP 237*    CXR: mild bibasilar atx, no overt edema  EKG: ST segment elevation inf leads and across precordial leads   ASSESSMENT / PLAN:  PULMONARY A: Acute  hypoxemic respiratory failure Wheezing P:   Supplemental O2 PRN NPPV Nebulized steroids, BDs  CARDIOVASCULAR A:  Shock, presumed sepsis Abnormal EKG - doubt STEMI. Consider pericarditis Minimally elevated trop I Mildly elevated BNP P:  Norepi to maintain MAP > 65 mmHg Empiric stress dose steroids  RENAL A:   AKI on CKD Metabolic acidosis Hyperkalemia P:   Monitor BMET intermittently Monitor I/Os Correct electrolytes as indicated HD cath placed and CRRT initiated Renal Service involved HCO3 infusion  GASTROINTESTINAL A:   Mildly abnormal LFTs Liver metastases P:   SUP: IV PPI NPO for now  HEME/ONC A:   Severe anemia without overt bleeding Metastatic high grade neuroendocrine cancer Could this be tumor lysis syndrome? P:  DVT px: full dose heparin Monitor CBC intermittently Transfuse per usual guidelines Oncology consultation requested - spoke with Dr Mike Gip  INFECTIOUS A:   Presumed severe sepsis, no definite source P:   Monitor temp, WBC count Micro and abx as above   ENDOCRINE A:   Hyperglycemia without prior history of DM P:   SSI - mod scale  NEUROLOGIC A:   Acute encephalopathy Anxiety  P:   RASS goal: 0 Low dose PRN lorazepam   FAMILY  - Updates: husband, daughter and multiple other family members updated in detail and several times throughout the day   CCM time: 75 mins The above time includes time spent in consultation with patient and/or family members and reviewing care plan on multidisciplinary rounds  Merton Border, MD PCCM service Mobile [phone] Pager [phone] 02/15/2017, 5:23 PM

## 2017-02-12 NOTE — ED Notes (Signed)
MD Lord at bedside. Multiple critical values reported to MD.

## 2017-02-12 NOTE — Progress Notes (Signed)
*  PRELIMINARY RESULTS* Echocardiogram 2D Echocardiogram has been performed.  Sharon Hester S [name] 02/16/2017, 3:00 PM

## 2017-02-12 NOTE — ED Notes (Signed)
Family at bedside, pt remains lethargic at this time, able to answer some questions.

## 2017-02-12 NOTE — Procedures (Signed)
PROCEDURE NOTE: L FEMORAL HD CATHETER PLACEMENT  INDICATION:    Need for CRRT  CONSENT:   Risks of procedure as well as the alternatives were explained to the patient or surrogate. Consent for procedure obtained. A time out was performed.   PROCEDURE  Sterile technique was used including antiseptics, cap, gloves, gown, hand hygiene, mask and full body sheet.  Skin prep: Chlorhexidine; local anesthetic administered  A 20 cm triple lumen HD catheter was placed in the L femoral vein using the Seldinger technique.   EVALUATION:  Blood flow good  Complications: No apparent complications  Patient tolerated the procedure well.    Merton Border, MD PCCM service Mobile [phone] Pager [phone] 03/05/2017 1:43 PM

## 2017-02-12 NOTE — ED Notes (Addendum)
1 blood transfusion started [id number] O verified with Celso Sickle, RN

## 2017-02-12 NOTE — ED Notes (Addendum)
Second blood transfusion started [id number] B verified with Celso Sickle, RN

## 2017-02-12 NOTE — ED Notes (Signed)
Port accessed by Kristin, RN 

## 2017-02-12 NOTE — ED Triage Notes (Addendum)
Pt came to ED via EMS from home. Pt has history of high grade neuro endocrine carcinoma. Started having breathing difficulty around 0530 with decreased loc. Per EMS, pt hypotensive at 84/47. Satting high 70s, low 80s, r/a per EMS. 98% on non-rebreather on arrival.

## 2017-02-12 NOTE — ED Notes (Signed)
3rd blood transfusion started.  [id number] K verified by Celso Sickle, RN

## 2017-02-12 NOTE — Progress Notes (Signed)
   03/04/2017 0800  Clinical Encounter Type  Visited With Patient;Family;Patient and family together  Visit Type Critical Care;ED  Referral From Nurse  Spiritual Encounters  Spiritual Needs Emotional  [place] visited room briefly at bedside with patient daughter who did not require support; [place] asked if [facility] could join following shift change; [place] will pass on to day [facility] per family request. 8:22 AM Gwynn Burly
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
## 2017-02-12 NOTE — Progress Notes (Signed)
ANTICOAGULATION CONSULT NOTE - Initial Consult  Pharmacy Consult for heparin Indication: chest pain/ACS  Allergies  Allergen Reactions  . Codeine Nausea And Vomiting    Patient Measurements: Height: 5\' 6"  (167.6 cm) Weight: 138 lb (62.6 kg) IBW/kg (Calculated) : 59.3 Heparin Dosing Weight: 63 kg  Vital Signs: Temp: 97.5 F (36.4 C) (05/05 0728) Temp Source: Axillary (05/05 0728) BP: 87/64 (05/05 0715) Pulse Rate: 90 (05/05 0712)  Labs:  Recent Labs  02/26/2017 0705  HGB 7.0*  HCT 21.8*  PLT 234  APTT 33  LABPROT 19.3*  INR 1.61  CREATININE 4.10*  TROPONINI 0.07*   Estimated Creatinine Clearance: 12.8 mL/min (A) (by C-G formula based on SCr of 4.1 mg/dL (H)).  Medical History: Past Medical History:  Diagnosis Date  . Allergic rhinitis   . Anxiety   . Asthma   . Attention deficit disorder without mention of hyperactivity   . Calculus of kidney   . Cataract   . Diffuse cystic mastopathy   . High cholesterol   . History of kidney stones   . Hypoglycemia, unspecified   . Migraine, unspecified, without mention of intractable migraine without mention of status migrainosus   . Mixed hyperlipidemia   . Osteopenia   . Precancerous lesion   . Unspecified essential hypertension    Assessment: Pharmacy consulted to dose and monitor heparin drip in this 66 year old female for ACS. EKG shows ST elevation. Spoke with MD to confirm patient was not taking anticoagulants prior to admission. Baseline labs have been ordered and resulted. Patient to be transported to cath lab per notes.  Goal of Therapy:  Heparin level 0.3-0.7 units/ml Monitor platelets by anticoagulation protocol: Yes   Plan:  4000 units bolus already given in ED Start heparin infusion at 800 units/hr Check anti-Xa level in 8 hours and daily while on heparin Continue to monitor H&H and platelets  Lenis Noon, PharmD Clinical Pharmacist 03/06/2017,8:06 AM

## 2017-02-12 NOTE — Consult Note (Signed)
CARDIOLOGY CONSULT NOTE  Patient ID: Sharon Hester MRN: [id number] DOB/AGE: 1951-04-19 66 y.o.  Admit date: 02/11/2017 Referring Physician:  : Dr. Reita Cliche Primary Physician: Tower, Wynelle Fanny, MD Primary Cardiologist : New Reason for Consultation : Abnormal ECG . Possible inferior STEMI   Date:  02/20/2017    Chief Complaint  Patient presents with  . Altered Mental Status  . Respiratory Distress      History of Present Illness: Sharon Hester is a 66 y.o. 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 When EMS arrived, she was noted to have agonal breathing and was hypotensive. She had mental status changes. She denied any chest discomfort. An EKG was performed which showed 1 mm of ST elevation in the inferior leads. The patient is currently in respiratory distress. She has no active chest pain but has significant dyspnea. No prior cardiac history. She is still full code at the present time. The family also noted leg edema since yesterday. She has been lethargic.  Past Medical History:  Diagnosis Date  . Allergic rhinitis   . Anxiety   . Asthma   . Attention deficit disorder without mention of hyperactivity   . Calculus of kidney   . Cataract   . Diffuse cystic mastopathy   . High cholesterol   . History of kidney stones   . Hypoglycemia, unspecified   . Migraine, unspecified, without mention of intractable migraine without mention of status migrainosus   . Mixed hyperlipidemia   . Osteopenia   . Precancerous lesion   . Unspecified essential hypertension     Past Surgical History:  Procedure Laterality Date  . ABDOMINAL HYSTERECTOMY     10/2002  . APPENDECTOMY    . COLONOSCOPY  5/06  . COLONOSCOPY WITH PROPOFOL N/A 05/12/2015   Procedure: COLONOSCOPY WITH PROPOFOL;   Surgeon: Manya Silvas, MD;  Location: [facility] ENDOSCOPY;  Service: Endoscopy;  Laterality: N/A;  . COLONOSCOPY WITH PROPOFOL N/A 09/27/2016   Procedure: COLONOSCOPY WITH PROPOFOL;  Surgeon: Manya Silvas, MD;  Location: [facility] ENDOSCOPY;  Service: Endoscopy;  Laterality: N/A;  . EYE SURGERY     multiple; s/p childhood trauma  . LITHOTRIPSY    . 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 PERIPHERAL VASCULAR CATHETERIZATION N/A 11/04/2016   Procedure: Glori Luis Cath Insertion;  Surgeon: Algernon Huxley, MD;  Location: [facility] CV LAB;  Service: Cardiovascular;  Laterality: N/A;  . TONSILLECTOMY       Current Facility-Administered Medications  Medication Dose Route Frequency Provider Last Rate Last Dose  . 0.9 %  sodium chloride infusion  10 mL/hr Intravenous Once Lisa Roca, MD      . 0.9 %  sodium chloride infusion  10 mL/hr Intravenous Once Lisa Roca, MD      . ceFEPIme (MAXIPIME) 2 g in dextrose 5 % 50 mL IVPB  2 g Intravenous Once Lenis Noon, [facility]      . vancomycin (VANCOCIN) IVPB 1000 mg/200 mL premix  1,000 mg Intravenous Once Lisa Roca, MD 200 mL/hr at 02/15/2017 0817 1,000 mg at 03/03/2017 9528   Current Outpatient Prescriptions  Medication Sig Dispense Refill  . albuterol (PROVENTIL HFA;VENTOLIN HFA) 108 (90 Base) MCG/ACT inhaler Inhale 2 puffs into the lungs every 6 (six) hours as needed for wheezing or shortness of breath. 1 Inhaler 2  . ondansetron (  ZOFRAN) 4 MG tablet take 1 tablet by mouth every 6 hours if needed for nausea and vomiting  0  . amLODipine (NORVASC) 5 MG tablet Take 1 tablet (5 mg total) by mouth daily. 90 tablet 3  . azithromycin (ZITHROMAX Z-PAK) 250 MG tablet Take 2 the first day and then 1 daily until completed 6 each 0  . cetirizine (ZYRTEC) 10 MG tablet Take 10 mg by mouth daily.      . cholecalciferol (VITAMIN D) 1000 UNITS tablet Take 1,000 Units by mouth daily.      . Coenzyme Q10 (COQ10) 400 MG CAPS Take 400 mg by mouth daily.     .  cyclobenzaprine (FLEXERIL) 5 MG tablet Take 1 tablet (5 mg total) by mouth 3 (three) times daily as needed for muscle spasms. (Patient not taking: Reported on 02/01/2017) 30 tablet 0  . Flaxseed, Linseed, (FLAX SEED OIL) 1000 MG CAPS Take 1 capsule by mouth daily.    . fluconazole (DIFLUCAN) 100 MG tablet Take 1 tablet (100 mg total) by mouth daily. Take 2 tablets on day one and then 1 tablet daily x 3 weeks 23 tablet 0  . fluticasone (FLONASE) 50 MCG/ACT nasal spray Place 2 sprays into both nostrils daily. 48 g 3  . HYDROcodone-acetaminophen (NORCO/VICODIN) 5-325 MG tablet Take 1-2 tablets by mouth every 6 (six) hours as needed. 40 tablet 0  . lidocaine (LIDODERM) 5 % Place 1 patch onto the skin daily. Remove & Discard patch within 12 hours of use each day 30 patch 0  . lidocaine-prilocaine (EMLA) cream Apply 1 application topically as needed. Apply to port 2 hours prior to appt 30 g 2  . LUMIGAN 0.01 % SOLN Place 1 drop into the right eye at bedtime.  0  . megestrol (MEGACE) 400 MG/10ML suspension Take 10 mLs (400 mg total) by mouth daily. 240 mL 0  . morphine (MS CONTIN) 15 MG 12 hr tablet Take 15 mg by mouth every 12 (twelve) hours.  0  . morphine (MSIR) 15 MG tablet take 1 tablet by mouth every 4 hours if needed for severe pain  0  . Multiple Vitamin (MULTIVITAMIN) tablet Take 1 tablet by mouth daily.      . naloxone (NARCAN) nasal spray 4 mg/0.1 mL Place 1 spray into the nose daily as needed (opioid overdose).    . nitrofurantoin, macrocrystal-monohydrate, (MACROBID) 100 MG capsule Take 1 capsule (100 mg total) by mouth 2 (two) times daily. (Patient not taking: Reported on 02/08/2017) 14 capsule 0  . nystatin (MYCOSTATIN) 100000 UNIT/ML suspension take 5 milliliters by mouth four times a day (Patient not taking: Reported on 02/08/2017) 120 mL 1  . OLANZapine (ZYPREXA) 10 MG tablet Take 1 tablet (10 mg total) by mouth at bedtime. 30 tablet 1  . Omega-3 Fatty Acids (FISH OIL) 1200 MG CAPS Take 1,200  mg by mouth daily.     . ondansetron (ZOFRAN-ODT) 4 MG disintegrating tablet Take 1 tablet (4 mg total) by mouth every 8 (eight) hours as needed for nausea or vomiting. 20 tablet 0  . Oxycodone HCl 10 MG TABS Take 1 tablet (10 mg total) by mouth every 6 (six) hours as needed. 15 tablet 0  . predniSONE (DELTASONE) 5 MG tablet Take 1 tablet (5 mg total) by mouth daily with breakfast. 30 tablet 0  . Probiotic Product (PROBIOTIC DAILY PO) Take 1 capsule by mouth daily.    . prochlorperazine (COMPAZINE) 10 MG tablet take 1 tablet by mouth every 6  hours if needed for nausea and vomiting 30 tablet 0  . rosuvastatin (CRESTOR) 20 MG tablet Take one and one half tablets by mouth once daily for cholesterol (Patient taking differently: Take 30 mg by mouth daily. ) 135 tablet 3  . venlafaxine XR (EFFEXOR-XR) 75 MG 24 hr capsule take 1 capsule by mouth once daily 90 capsule 3    Allergies:   Codeine    Social History:  The patient  reports that she has never smoked. She has never used smokeless tobacco. She reports that she drinks about 0.6 oz of alcohol per week . She reports that she does not use drugs.   Family History:  The patient's family history includes Colon cancer in her father; Coronary artery disease in her maternal grandmother and mother; Diabetes in her father; Heart attack in her father and mother; Hyperlipidemia in her mother; Hypertension in her father; Other in her brother and brother; Pancreatic cancer in her mother.    ROS:  Please see the history of present illness.   Otherwise, review of systems are positive for none.   All other systems are reviewed and negative.    PHYSICAL EXAM: VS:  BP (!) 64/49 (BP Location: Right Arm)   Pulse 92   Temp 97.5 F (36.4 C) (Axillary)   Resp 16   Ht 5\' 6"  (1.676 m)   Wt 138 lb (62.6 kg)   SpO2 (!) 59%   BMI 22.27 kg/m  , BMI Body mass index is 22.27 kg/m. GEN:  Critically ill patient. HEENT: normal  Neck: no JVD, carotid bruits, or  masses Cardiac: RRR; no murmurs, rubs, or gallops, moderate leg edema Respiratory: Moderate respiratory distress with bilateral rhonchi and wheezing GI: soft, nontender, nondistended, + BS MS: no deformity or atrophy  Skin: warm and dry, no rash Neuro:  Strength and sensation are intact Psych: She is lethargic with waning mental status.   EKG:   Personally reviewed by me and showed: Normal sinus rhythm with 1 mm of inferior ST elevation with no reciprocal changes.   Recent Labs: 04/23/2016: TSH 1.86 01/24/2017: Magnesium 2.1 03/04/2017: ALT 27; BUN 66; Creatinine, Ser 4.10; Hemoglobin 7.0; Platelets 234; Potassium 6.8; Sodium 128    Lipid Panel    Component Value Date/Time   CHOL 105 03/10/2017 0705   TRIG 211 (H) 03/03/2017 0705   HDL 20 (L) 03/05/2017 0705   CHOLHDL 5.3 02/21/2017 0705   VLDL 42 (H) 02/15/2017 0705   LDLCALC 43 02/10/2017 0705   LDLDIRECT 145.0 04/23/2016 0931      Wt Readings from Last 3 Encounters:  02/08/2017 138 lb (62.6 kg)  02/08/17 138 lb 6.4 oz (62.8 kg)  02/01/17 141 lb 9.6 oz (64.2 kg)         ASSESSMENT AND PLAN:  1.  Possible inferior ST elevation myocardial infarction: The patient presented with acute respiratory failure. Labs shows multiorgan failure with a creatinine 4.1 and very low albumin. Her pH is 7.04. In the setting of metastatic high-grade neuroendocrine carcinoma and underlying metabolic abnormalities, the risks of cardiac catheterization outweigh the benefits. I recommend medical therapy with aspirin. She was given heparin bolus but unlikely to be a good candidate for a heparin drip given a hemoglobin of 7. Overall prognosis is poor.  2. Acute respiratory failure: Likely due to underlying metastatic cancer. The patient has been on palliative chemotherapy. Recommend addressing CODE STATUS. I suggest DO NOT RESUSCITATE and getting palliative care involved. Given underlying malignancy, pulmonary embolism is a  possibility but not able  to do CT scan at the present time due to renal failure.  I discussed the case with the patient and family as well as Dr. Reita Cliche.   Signed,  Kathlyn Sacramento, MD  02/15/2017 9:05 AM    [place]

## 2017-02-12 NOTE — Progress Notes (Signed)
Spoke with Dr. Juleen China to report that patient is having Sinus tachy cardia, and SVT intermittently, after dialysis initiation.  He reports to continue dialysis and watch her HR.  Pt's BP remains stable.

## 2017-02-12 NOTE — Progress Notes (Signed)
eLink Physician-Brief Progress Note Patient Name: Sharon Hester DOB: 03-12-1951 MRN: [id number]   Date of Service  02/23/2017  HPI/Events of Note  Multiple issues: 1. Remains in AFIB w/RVR. HR = 140's- 150's and 2. Nurse reports that R foot mottled. DP and PT pulses intact on R foot.   eICU Interventions  Will order: 1. Amiodarone 150 mg IV over 10 minutes now. 2. Continue to monitor DP and PT pulses in R foot.      Intervention Category Major Interventions: Arrhythmia - evaluation and management  Sommer,Steven Cornelia Copa 03/07/2017, 9:53 PM

## 2017-02-12 NOTE — Progress Notes (Signed)
Spoke with Dr. Emmit Alexanders following up Adam's call r/t A-fib RVR. MD ordered amio. Gtt. Will continue to monitor pt. Closely.

## 2017-02-12 NOTE — ED Notes (Signed)
Pt not responding when asked if in pain

## 2017-02-12 NOTE — Progress Notes (Signed)
[facility] on-call recommend a follow visit with Pt. CH met with Pt in the ED [id number]. Pt's family were at the bedside. [place] spoke briefly the Pt's husband who seemed overwhelmed by what was going on with his wife. [place] informed family that [facility] services were available as needed.     02/09/2017 1100  Clinical Encounter Type  Visited With Patient;Patient and family together  Visit Type Critical Care;ED  Referral From [place] Needs Emotional

## 2017-02-12 NOTE — Progress Notes (Signed)
Pharmacy Antibiotic Note  Sharon Hester is a 66 y.o. female admitted on 03/10/2017 with sepsis.  Pharmacy has been consulted for vancomycin and cefepime dosing.  Patient is in ARF on admission with SCr 4.1.   Plan: Cefepime 2 g IV dose given in ED. Will order cefepime 2 g IV daily beginning tomorrow.  Vancomycin 1000 mg dose given in ED (~16 mg/kg). Given that patient is in ARF, will dose vancomycin off of random levels. Calculated half-life of 50 hours so have ordered vancomycin level for 5/7 at 0900 which will represent a 48 hour level.  SCr ordered with AM labs tomorrow to assess renal function.  Height: 5\' 6"  (167.6 cm) Weight: 138 lb (62.6 kg) IBW/kg (Calculated) : 59.3  Temp (24hrs), Avg:97.1 F (36.2 C), Min:95.5 F (35.3 C), Max:97.7 F (36.5 C)   Recent Labs Lab 02/08/17 0834 02/15/2017 0705 03/08/2017 0749 03/09/2017 1040  WBC 38.0* 25.8*  --   --   CREATININE 1.61* 4.10*  --   --   LATICACIDVEN  --   --  3.0* 2.7*    Estimated Creatinine Clearance: 12.8 mL/min (A) (by C-G formula based on SCr of 4.1 mg/dL (H)).    Allergies  Allergen Reactions  . Codeine Nausea And Vomiting   Antimicrobials this admission: vancomycin 5/5 >>  cefepime 5/5 >>   Dose adjustments this admission:  Microbiology results: 5/5 BCx: Sent  Thank you for allowing pharmacy to be a part of this patient's care.  Lenis Noon, PharmD Clinical Pharmacist 02/24/2017 12:17 PM

## 2017-02-12 NOTE — Progress Notes (Signed)
[place] consulted to dose adjust medications for CRRT  Assessment/Plan: Antibiotics: Begin cefepime 2 g IV q 12 hours Begin vancomycin 1000 mg IV q 24 hours (trough ordered prior to 3rd dose with a goal of 15-25)  Allergies  Allergen Reactions  . Codeine Nausea And Vomiting    Patient Measurements: Height: 5\' 6"  (167.6 cm) Weight: 138 lb (62.6 kg) IBW/kg (Calculated) : 59.3  Vital Signs: Temp: 95.5 F (35.3 C) (05/05 1100) Temp Source: Axillary (05/05 0920) BP: 111/87 (05/05 1200) Pulse Rate: 90 (05/05 1100) Intake/Output from previous day: No intake/output data recorded. Intake/Output from this shift: Total I/O In: 3240 [Blood:740; IV Piggyback:2500] Out: 300 [Urine:300]  Labs:  Recent Labs  02/16/2017 0705 03/04/2017 1204  WBC 25.8*  --   HGB 7.0*  --   HCT 21.8*  --   PLT 234  --   APTT 33  --   CREATININE 4.10* 3.75*  MG  --  2.4  ALBUMIN 2.2*  --   PROT 6.4*  --   AST 71*  --   ALT 27  --   ALKPHOS 392*  --   BILITOT 0.7  --    Estimated Creatinine Clearance: 14 mL/min (A) (by C-G formula based on SCr of 3.75 mg/dL (H)).   Microbiology: Recent Results (from the past 720 hour(s))  Culture, blood (routine x 2)     Status: None (Preliminary result)   Collection Time: 02/21/2017  7:05 AM  Result Value Ref Range Status   Specimen Description BLOOD LEFT NECK LINE  Final   Special Requests   Final    BOTTLES DRAWN AEROBIC AND ANAEROBIC Blood Culture results may not be optimal due to an excessive volume of blood received in culture bottles   Culture NO GROWTH <12 HOURS  Final   Report Status PENDING  Incomplete  Culture, blood (routine x 2)     Status: None (Preliminary result)   Collection Time: 02/18/2017  7:49 AM  Result Value Ref Range Status   Specimen Description BLOOD LEFT HAND  Final   Special Requests   Final    BOTTLES DRAWN AEROBIC AND ANAEROBIC Blood Culture results may not be optimal due to an excessive volume  of blood received in culture bottles   Culture NO GROWTH <12 HOURS  Final   Report Status PENDING  Incomplete    Medical History: Past Medical History:  Diagnosis Date  . Allergic rhinitis   . Anxiety   . Asthma   . Attention deficit disorder without mention of hyperactivity   . Calculus of kidney   . Cataract   . Diffuse cystic mastopathy   . High cholesterol   . History of kidney stones   . Hypoglycemia, unspecified   . Migraine, unspecified, without mention of intractable migraine without mention of status migrainosus   . Mixed hyperlipidemia   . Neuroendocrine tumor   . Osteopenia   . Precancerous lesion   . Unspecified essential hypertension     Darrow Bussing, PharmD Pharmacy Resident 03/04/2017 1:44 PM

## 2017-02-12 NOTE — ED Provider Notes (Signed)
[facility] Emergency Department Provider Note ____________________________________________   I have reviewed the triage vital signs and the triage nursing note.  HISTORY  Chief Complaint Altered Mental Status and Respiratory Distress   Historian Limited history from patient, confusion and poor historian Critical illness EMS provides history, received report from husband at home. Husband at bedside provides additional history. 2 daughters provide additional history.  HPI Sharon Hester is a 66 y.o. 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 She started having a cough about 2 days ago and was just started on azithromycin after phone call with I believe the oncologist. 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 EMS reported hypoxic on room air into the 70s, as well as blood pressure in the 70s. No IV access was obtained in the field. EKG in the field showed inferior ST segment elevation consistent with STEMI and was activated as a STEMI prior to patient hospital arrival.  Patient states she is having no chest pain. Shakes her head no that she is not having any trouble breathing. She states that she is in no pain.    Past Medical History:  Diagnosis Date  . Allergic rhinitis   . Anxiety   . Asthma   . Attention deficit disorder without mention of hyperactivity   . Calculus of kidney   . Cataract   . Diffuse cystic mastopathy   . High cholesterol   . History of kidney stones   . Hypoglycemia, unspecified   . Migraine, unspecified, without mention of intractable migraine without mention of status migrainosus   . Mixed hyperlipidemia   . Neuroendocrine tumor   . Osteopenia   . Precancerous lesion    . Unspecified essential hypertension     Patient Active Problem List   Diagnosis Date Noted  . Acute respiratory failure ([place]) 02/13/2017  . Bone metastases ([place]) 11/02/2016  . High grade neuroendocrine carcinoma ([place]) 10/14/2016  . Goals of care, counseling/discussion 10/14/2016  . Liver enzyme elevation 09/22/2016  . Abdominal pain, right upper quadrant 09/21/2016  . Lumbar pain 09/21/2016  . Diarrhea 09/21/2016  . Contusion, foot 04/23/2016  . ETD (eustachian tube dysfunction) 04/23/2016  . OSTEOPENIA 10/02/2007  . HYPOGLYCEMIA 09/14/2007  . MIGRAINE HEADACHE 09/14/2007  . Allergic rhinitis 09/14/2007  . ASTHMA 09/14/2007  . RENAL CALCULUS 09/14/2007  . FIBROCYSTIC BREAST DISEASE 09/14/2007  . HYPERLIPIDEMIA 08/22/2007  . ADD 08/22/2007  . Essential hypertension 08/22/2007    Past Surgical History:  Procedure Laterality Date  . ABDOMINAL HYSTERECTOMY     10/2002  . APPENDECTOMY    . COLONOSCOPY  5/06  . COLONOSCOPY WITH PROPOFOL N/A 05/12/2015   Procedure: COLONOSCOPY WITH PROPOFOL;  Surgeon: Manya Silvas, MD;  Location: [facility] ENDOSCOPY;  Service: Endoscopy;  Laterality: N/A;  . COLONOSCOPY WITH PROPOFOL N/A 09/27/2016   Procedure: COLONOSCOPY WITH PROPOFOL;  Surgeon: Manya Silvas, MD;  Location: [facility] ENDOSCOPY;  Service: Endoscopy;  Laterality: N/A;  . EYE SURGERY     multiple; s/p childhood trauma  . LITHOTRIPSY    . 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 PERIPHERAL VASCULAR CATHETERIZATION N/A 11/04/2016   Procedure: Glori Luis Cath Insertion;  Surgeon: Algernon Huxley, MD;  Location: [facility];  Service: Cardiovascular;  Laterality: N/A;  .  TONSILLECTOMY      Prior to Admission medications   Medication Sig Start Date End Date Taking? Authorizing Provider  albuterol (PROVENTIL HFA;VENTOLIN HFA) 108 (90 Base) MCG/ACT inhaler Inhale 2 puffs into the lungs every 6 (six) hours as needed for wheezing or shortness of breath. 02/11/17  Yes Sindy Guadeloupe,  MD  amLODipine (NORVASC) 5 MG tablet Take 1 tablet (5 mg total) by mouth daily. 04/23/16  Yes Tower, Wynelle Fanny, MD  azithromycin (ZITHROMAX Z-PAK) 250 MG tablet Take 2 the first day and then 1 daily until completed 02/11/17  Yes Sindy Guadeloupe, MD  cetirizine (ZYRTEC) 10 MG tablet Take 10 mg by mouth daily.     Yes [provider]  cholecalciferol (VITAMIN D) 1000 UNITS tablet Take 1,000 Units by mouth daily.     Yes [provider]  Coenzyme Q10 (COQ10) 400 MG CAPS Take 400 mg by mouth daily.    Yes [provider]  Flaxseed, Linseed, (FLAX SEED OIL) 1000 MG CAPS Take 1 capsule by mouth daily.   Yes [provider]  fluconazole (DIFLUCAN) 100 MG tablet Take 1 tablet (100 mg total) by mouth daily. Take 2 tablets on day one and then 1 tablet daily x 3 weeks 01/24/17  Yes Sindy Guadeloupe, MD  lidocaine (LIDODERM) 5 % Place 1 patch onto the skin daily. Remove & Discard patch within 12 hours of use each day 10/26/16  Yes Sindy Guadeloupe, MD  lidocaine-prilocaine (EMLA) cream Apply 1 application topically as needed. Apply to port 2 hours prior to appt 11/03/16  Yes Sindy Guadeloupe, MD  LUMIGAN 0.01 % SOLN Place 1 drop into the right eye at bedtime. 10/31/16  Yes [provider]  megestrol (MEGACE) 400 MG/10ML suspension Take 10 mLs (400 mg total) by mouth daily. 02/08/17  Yes Sindy Guadeloupe, MD  Multiple Vitamin (MULTIVITAMIN) tablet Take 1 tablet by mouth daily.     Yes [provider]  naloxone (NARCAN) nasal spray 4 mg/0.1 mL Place 1 spray into the nose daily as needed (opioid overdose).   Yes [provider]  OLANZapine (ZYPREXA) 10 MG tablet Take 1 tablet (10 mg total) by mouth at bedtime. 02/08/17  Yes Sindy Guadeloupe, MD  Omega-3 Fatty Acids (FISH OIL) 1200 MG CAPS Take 1,200 mg by mouth daily.    Yes [provider]  ondansetron (ZOFRAN) 4 MG tablet take 1 tablet by mouth every 6 hours if needed for nausea and vomiting 10/14/16  Yes [provider]  ondansetron (ZOFRAN-ODT) 4 MG disintegrating tablet Take 1 tablet (4 mg total) by mouth every 8 (eight) hours as needed for nausea or vomiting. 01/11/17  Yes Sindy Guadeloupe, MD  Oxycodone HCl 10 MG TABS Take 1 tablet (10 mg total) by mouth every 6 (six) hours as needed. 02/09/17  Yes Cammie Sickle, MD  predniSONE (DELTASONE) 5 MG tablet Take 1 tablet (5 mg total) by mouth daily with breakfast. 02/08/17  Yes Sindy Guadeloupe, MD  Probiotic Product (PROBIOTIC DAILY PO) Take 1 capsule by mouth daily.   Yes [provider]  prochlorperazine (COMPAZINE) 10 MG tablet take 1 tablet by mouth every 6 hours if needed for nausea and vomiting 01/28/17  Yes Sindy Guadeloupe, MD  rosuvastatin (CRESTOR) 20 MG tablet Take one and one half tablets by mouth once daily for cholesterol Patient taking differently: Take 30 mg by mouth daily.  04/23/16  Yes Tower, Wynelle Fanny, MD  venlafaxine  XR (EFFEXOR-XR) 75 MG 24 hr capsule take 1 capsule by mouth once daily 04/23/16  Yes Tower, Wynelle Fanny, MD  cyclobenzaprine (FLEXERIL) 5 MG tablet Take 1 tablet (5 mg total) by mouth 3 (three) times daily as needed for muscle spasms. Patient not taking: Reported on 02/01/2017 12/21/16   Sindy Guadeloupe, MD  fluticasone [facility]) 50 MCG/ACT nasal spray Place 2 sprays into both nostrils daily. Patient not taking: Reported on 03/09/2017 04/23/16 04/23/17  Tower, Wynelle Fanny, MD  HYDROcodone-acetaminophen (NORCO/VICODIN) 5-325 MG tablet Take 1-2 tablets by mouth every 6 (six) hours as needed. Patient not taking: Reported on 02/13/2017 09/24/16   Tower, Wynelle Fanny, MD  nitrofurantoin, macrocrystal-monohydrate, (MACROBID) 100 MG capsule Take 1 capsule (100 mg total) by mouth 2 (two) times daily. Patient not taking: Reported on 02/08/2017 09/11/16   Norval Gable, MD  nystatin (MYCOSTATIN) 100000 UNIT/ML suspension take 5 milliliters by mouth four times a day Patient not taking: Reported on 02/08/2017 12/08/16   Sindy Guadeloupe, MD    Allergies   Allergen Reactions  . Codeine Nausea And Vomiting    Family History  Problem Relation Age of Onset  . Diabetes Father   . Colon cancer Father   . Hypertension Father   . Heart attack Father   . Heart attack Mother   . Coronary artery disease Mother   . Pancreatic cancer Mother   . Hyperlipidemia Mother   . Other Brother     Congenital hyperspadias  . Other Brother     loss of kidney function in 1 kidney early in life  . Diabetes      Grandmother  . Coronary artery disease Maternal Grandmother   . Coronary artery disease      Maternal aunts and uncles  . Breast cancer Neg Hx   . Ovarian cancer Neg Hx   . Uterine cancer Neg Hx     Social History Social History  Substance Use Topics  . Smoking status: Never Smoker  . Smokeless tobacco: Never Used  . Alcohol use 0.6 oz/week    1 Shots of liquor per week     Comment: occasional last  month    Review of Systems  Level V caveat not obtained due to altered mental status and critical illness. ____________________________________________   PHYSICAL EXAM:  VITAL SIGNS: ED Triage Vitals  Enc Vitals Group     BP 02/11/2017 0712 (!) 78/54     Pulse Rate 02/17/2017 0712 90     Resp 02/20/2017 0712 17     Temp 02/11/2017 0728 97.5 F (36.4 C)     Temp Source 02/25/2017 0728 Axillary     SpO2 02/23/2017 0711 97 %     Weight 02/09/2017 0713 138 lb (62.6 kg)     Height 02/13/2017 0713 5\' 6"  (1.676 m)     Head Circumference --      Peak Flow --      Pain Score --      Pain Loc --      Pain Edu? --      Excl. in [place]? --      Constitutional: Alert and Cooperative. On nonrebreather, somewhat heavy breathing. HEENT   Head: Normocephalic and atraumatic.      Eyes: Conjunctivae are normal. Right eye pupil round and reactive to light.      Ears:         Nose: No congestion/rhinnorhea.   Mouth/Throat: Mucous membranes are moderately dry in the mouth.   Neck:  No stridor. Cardiovascular/Chest: Normal rate, regular rhythm.  No  murmurs, rubs, or gallops. Respiratory: Somewhat decreased air movement throughout. Rhonchi throughout especially posteriorly and at the bases bilaterally. Some expiratory wheezing. Gastrointestinal: Soft. No distention, no guarding, no rebound. Nontender.   Genitourinary/rectal:Deferred Musculoskeletal: Nontender with normal range of motion in all extremities. 1-2+ pitting edema bilateral lower extremities without calf tenderness. Neurologic:  No facial droop. She is talking and interacting with myself and her family. Moving 4 extremities, unable to cooperate with complete neurologic exam, but no gross or focal neurologic deficits are appreciated. Skin:  Skin is warm, dry and intact. No rash noted. Psychiatric: No agitation. No hallucinations. She is somewhat confused, poor historian.   ____________________________________________  LABS (pertinent positives/negatives)  Labs Reviewed  CBC WITH DIFFERENTIAL/PLATELET - Abnormal; Notable for the following:       Result Value   WBC 25.8 (*)    RBC 2.42 (*)    Hemoglobin 7.0 (*)    HCT 21.8 (*)    RDW 20.5 (*)    Neutro Abs 25.8 (*)    Lymphs Abs 0.0 (*)    Monocytes Absolute 0.0 (*)    All other components within normal limits  PROTIME-INR - Abnormal; Notable for the following:    Prothrombin Time 19.3 (*)    All other components within normal limits  COMPREHENSIVE METABOLIC PANEL - Abnormal; Notable for the following:    Sodium 128 (*)    Potassium 6.8 (*)    CO2 9 (*)    Glucose, Bld 315 (*)    BUN 66 (*)    Creatinine, Ser 4.10 (*)    Calcium 7.1 (*)    Total Protein 6.4 (*)    Albumin 2.2 (*)    AST 71 (*)    Alkaline Phosphatase 392 (*)    GFR calc non Af Amer 11 (*)    GFR calc Af Amer 12 (*)    Anion gap 17 (*)    All other components within normal limits  TROPONIN I - Abnormal; Notable for the following:    Troponin I 0.07 (*)    All other components within normal limits  LIPID PANEL - Abnormal; Notable for the  following:    Triglycerides 211 (*)    HDL 20 (*)    VLDL 42 (*)    All other components within normal limits  LACTIC ACID, PLASMA - Abnormal; Notable for the following:    Lactic Acid, Venous 3.0 (*)    All other components within normal limits  BLOOD GAS, ARTERIAL - Abnormal; Notable for the following:    pH, Arterial 7.04 (*)    pO2, Arterial 78 (*)    Bicarbonate 9.2 (*)    Acid-base deficit 20.5 (*)    All other components within normal limits  CULTURE, BLOOD (ROUTINE X 2)  CULTURE, BLOOD (ROUTINE X 2)  APTT  BRAIN NATRIURETIC PEPTIDE  LACTIC ACID, PLASMA  URINALYSIS, COMPLETE (UACMP) WITH MICROSCOPIC  TYPE AND SCREEN  PREPARE RBC (CROSSMATCH)    ____________________________________________    EKG I, Lisa Roca, MD, the attending physician have personally viewed and interpreted all ECGs.  94 bpm. Normal sinus rhythm. Nonspecific intraventricular conduction delay. 1-2 small boxes of ST segment elevation in inferior leads II, III, and avf, as well as V5 and V6 consistent with inferolateral STEMI. ____________________________________________  RADIOLOGY All Xrays were viewed by me. Imaging interpreted by Radiologist.  Chest x-ray for one view:IMPRESSION: Mild bibasilar subsegmental atelectasis. Minimal left pleural effusion. __________________________________________  PROCEDURES  Procedure(s) performed: External jugular IV access. Indication, critical illness, needs IV access. Performed by myself, Dr. Reita Cliche MD.  Left EJ was accessed with [id number] IV, no complications.   Critical Care performed: CRITICAL CARE Performed by: Lisa Roca   Total critical care time: 140 minutes  Critical care time was exclusive of separately billable procedures and treating other patients.  Critical care was necessary to treat or prevent imminent or life-threatening deterioration.  Critical care was time spent personally by me on the following activities: development of  treatment plan with patient and/or surrogate as well as nursing, discussions with consultants, evaluation of patient's response to treatment, examination of patient, obtaining history from patient or surrogate, ordering and performing treatments and interventions, ordering and review of laboratory studies, ordering and review of radiographic studies, pulse oximetry and re-evaluation of patient's condition.   ____________________________________________   ED COURSE / ASSESSMENT AND PLAN  Pertinent labs & imaging results that were available during my care of the patient were reviewed by me and considered in my medical decision making (see chart for details).   Ms. Coventry arrived by EMS with prehospital STEMI alert. On arrival patient was extremely pale, oxygenating in the mid 90s on a nonrebreather, and hypotensive with a blood pressure in the 70s without IV access. Patient was placed on our monitor and IV access was obtained peripherally on the left upper extremity as well as left EJ.  EKG did confirm inferior STEMI. Dr. Fletcher Anon, STEMI cardiologist came to the bedside immediately and reviewed the patient's history including widely metastatic high-grade cancer, as well as significant hypoxia felt unlikely to be sequelae of the inferior MI, and the patient's risk for tolerating cardiac catheter was felt to be extremely risky.  Dr. Fletcher Anon did not recommend immediate cardiac catheterization.  Patient was given aspirin as well as heparin bolus followed by heparin drip.  I spoke to the family which was the husband initially and the patient  Regarding ongoing wishes and CODE STATUS. Patient seems somewhat confused but stated she did not want to be on a ventilator long-term. When I asked her if she would wanted CPR, she stated yes. Her husband was fairly distraught at the apparent critical illness, and initially felt like she needed would like everything done.  Chest x-ray did not show obvious pulmonary edema  or pneumonia or other cause for the severe hypoxia.   I was quite concerned that she could be having sepsis without obvious source on the x-ray, and started IV fluid bolus with associated low blood pressure. However is also quite concerned clinically that she had new lower extremity edema and vomiting sounded like possible volume overload, an inferior MI concerning for possible issues with volume overload, such that without judicious use of fluid, this could make her worse.  After reviewing her oncology notes, was quite concerned about her overall prognosis given the high-grade metastatic cancer that is been receiving palliative care. Family did not seem very familiar with the severity of the illness, certainly with respect to this acute illness in the setting of the underlying cancer.  Patient found to be anemic around 7.0, I did order a emergency release blood in hopes that this may help with oxygenation in the ongoing heart attack. Patient has been consented to this.  Patient is in acute renal failure with hyperkalemia. She was given temporizing measures including IV bicarbonate, dextrose and insulin as well as IV calcium. Again she is receiving some fluids, 1 L followed by a second  liter for hypotension as well as renal failure.  Height spoken with Dr. Fletcher Anon regarding hypotension in the setting of the inferior MI. We discussed pressorof Levophed over inotrope such as dobutamine.  Given that patient's prognosis seems extremely guarded and poor, she was currently interactive and I spoke with the family about trying to avoid volume overload state or pulmonary edema that would hasten intubation which I did not feel was likely to change the overall outcome.  Daughters and husband were explained my concern about numerous organ failures with conflicting treatments that could worsen each other and certainly decrease the amount of time they had alert to peak with each other. Family did agree and decided that  it was highly valuable to them to try to prolong the amount of time that she was awake and alert.  I spoke with Dr. Jamal Collin, ICU physician who will consult once the patient is in the ICU for the hospitalist team, and he was in agreement that the patient's prognosis was extremely poor with intubation. I was concerned that this would be futile and potentially adding invasive treatments or harm to patient, but wanted to make sure family was given all the options.  Because of her decreased perfusion, there was some difficulty with maintaining O2 sat monitoring, however intermittently the O2 sat probe with pickup and show oxygen around 90%. At one point she was on 6 L nasal cannula and blood gas was obtained which did show metabolic acidosis but oxygenation P SaO2 around 70.  I discussed with the family using temporizing BiPAP not as a treatment solution, but as an effort to have more time with her alert and interacting, and knowing that it would be delaying intubation if they did decide for that.  Her o2 sat dropped into the 50s with a good wave form and I again spoke with the husband and the daughter about this being the time to intubate because at this point the low oxygen would likely be contributing to worsening medical condition. However she was continuing to breathe on her own and be alert and somewhat interactive. Family wanted to wait until other family members got here without during intubation at this point time. Daughter stated that she had a conversation with her mother 1 week ago where the mother had requested to go silently, and was hoping for kidney failure.  This was a very difficult case in conversation from start to finish with the patient and the husband and the family regarding critical illness and whether to push forward in a likely to be futile situation, at the expense of an undetermined period of time where the patient is remaining alert and interactive. She was confused enough that she  did not seem to understand the nuances of the discussion.   Dr. Posey Pronto, hospitalist consulted for admission.  Patient remains Full Code, although they do not want intubation at this point.    CONSULTATIONS:  Dr. Fletcher Anon, cardiologist.  ICU Dr. Alva Garnet.  Dr. Posey Pronto, hospitalist.   Patient / Family / Caregiver informed of clinical course, medical decision-making process, and agree with plan.    ___________________________________________   FINAL CLINICAL IMPRESSION(S) / ED DIAGNOSES   Final diagnoses:  Acute respiratory failure with hypoxia ([place])  AKI (acute kidney injury) ([place])  Sepsis, due to unspecified organism ([place])  Acute ST elevation myocardial infarction (STEMI) of inferior wall ([facility])  Symptomatic anemia              Note: This dictation was prepared with Viviann Spare  dictation. Any transcriptional errors that result from this process are unintentional    Lisa Roca, MD 02/28/2017 1122

## 2017-02-12 NOTE — Progress Notes (Signed)
eLink Physician-Brief Progress Note Patient Name: Sharon Hester DOB: [birth year] MRN: [id number]   Date of Service  02/10/2017  HPI/Events of Note  AFIB with RVR - Ventricular rate = 154. BP = 100/53. Already cycling Troponins. Last Troponin = 0.13. K+ = 4.9 and Mg++ = 2.1. Cardiology already following the patient.   eICU Interventions  Will order: 1. Amiodarone IV bolus and infusion.      Intervention Category Major Interventions: Arrhythmia - evaluation and management  Sommer,Steven Eugene 02/27/2017, 7:12 PM

## 2017-02-12 NOTE — ED Notes (Signed)
Blood transfusion complete [id number] O

## 2017-02-12 NOTE — H&P (Signed)
[facility] [place] NAME: Sharon Hester    MR#:  [id number]  DATE OF BIRTH:  28-Aug-1951  DATE OF ADMISSION:  02/13/2017  PRIMARY CARE PHYSICIAN: Tower, Wynelle Fanny, MD   REQUESTING/REFERRING PHYSICIAN: Lisa Roca MD  CHIEF COMPLAINT:   Chief Complaint  Patient presents with  . Altered Mental Status  . Respiratory Distress    HISTORY OF PRESENT ILLNESS: Sharon Hester  is a 66 y.o. female with a known history of Neuroendocrine tumor who has been receiving chemotherapy and radiation who has had shortness of breath since Wednesday. According to her husband they called her primary oncologist who placed her on oral antibiotics for possible acute bronchitis. Patient's breathing continued to progressively get worse and then this morning around 4 AM when she wanted to go to the bathroom her respiratory status got worse. Patient was brought to the ED and was noted to be hypotensive. And had to be placed on a BiPAP. Also according to the husband she had this glazed look on her. Patient in the ER is noted to be hypotensive. Noted to have acute renal failure and hyperkalemia. ABG shows that she has metabolic acidosis. Patient currently on BiPAP not able to communicate multiple family members at bedside. According to the husband patient's has had decline since receiving chemotherapy. When patient initially arrived in the ER her EKG showed possible ST elevation therefore a cardiology consult was obtained. Due to her being severely anemic and acute renal failure cardiology recommended supportive care and consideration of palliative care input.  PAST MEDICAL HISTORY:   Past Medical History:  Diagnosis Date  . Allergic rhinitis   . Anxiety   . Asthma   . Attention deficit disorder without mention of hyperactivity   . Calculus of kidney   . Cataract   . Diffuse cystic mastopathy   . High cholesterol   . History of kidney stones   . Hypoglycemia, unspecified   .  Migraine, unspecified, without mention of intractable migraine without mention of status migrainosus   . Mixed hyperlipidemia   . Neuroendocrine tumor   . Osteopenia   . Precancerous lesion   . Unspecified essential hypertension     PAST SURGICAL HISTORY: Past Surgical History:  Procedure Laterality Date  . ABDOMINAL HYSTERECTOMY     10/2002  . APPENDECTOMY    . COLONOSCOPY  5/06  . COLONOSCOPY WITH PROPOFOL N/A 05/12/2015   Procedure: COLONOSCOPY WITH PROPOFOL;  Surgeon: Manya Silvas, MD;  Location: [facility] ENDOSCOPY;  Service: Endoscopy;  Laterality: N/A;  . COLONOSCOPY WITH PROPOFOL N/A 09/27/2016   Procedure: COLONOSCOPY WITH PROPOFOL;  Surgeon: Manya Silvas, MD;  Location: [facility] ENDOSCOPY;  Service: Endoscopy;  Laterality: N/A;  . EYE SURGERY     multiple; s/p childhood trauma  . LITHOTRIPSY    . 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 PERIPHERAL VASCULAR CATHETERIZATION N/A 11/04/2016   Procedure: Glori Luis Cath Insertion;  Surgeon: Algernon Huxley, MD;  Location: [facility];  Service: Cardiovascular;  Laterality: N/A;  . TONSILLECTOMY      SOCIAL HISTORY:  Social History  Substance Use Topics  . Smoking status: Never Smoker  . Smokeless tobacco: Never Used  . Alcohol use 0.6 oz/week    1 Shots of liquor per week     Comment: occasional last  month    FAMILY HISTORY:  Family History  Problem Relation Age of Onset  . Diabetes Father   . Colon  cancer Father   . Hypertension Father   . Heart attack Father   . Heart attack Mother   . Coronary artery disease Mother   . Pancreatic cancer Mother   . Hyperlipidemia Mother   . Other Brother     Congenital hyperspadias  . Other Brother     loss of kidney function in 1 kidney early in life  . Diabetes      Grandmother  . Coronary artery disease Maternal Grandmother   . Coronary artery disease      Maternal aunts and uncles  . Breast cancer Neg Hx   . Ovarian cancer Neg Hx   . Uterine cancer Neg Hx      DRUG ALLERGIES:  Allergies  Allergen Reactions  . Codeine Nausea And Vomiting    REVIEW OF SYSTEMS:   CONSTITUTIONAL:Unable to provide due to mental status and respiratory failure MEDICATIONS AT HOME:  Prior to Admission medications   Medication Sig Start Date End Date Taking? Authorizing Provider  albuterol (PROVENTIL HFA;VENTOLIN HFA) 108 (90 Base) MCG/ACT inhaler Inhale 2 puffs into the lungs every 6 (six) hours as needed for wheezing or shortness of breath. 02/11/17  Yes Sindy Guadeloupe, MD  amLODipine (NORVASC) 5 MG tablet Take 1 tablet (5 mg total) by mouth daily. 04/23/16  Yes Tower, Wynelle Fanny, MD  azithromycin (ZITHROMAX Z-PAK) 250 MG tablet Take 2 the first day and then 1 daily until completed 02/11/17  Yes Sindy Guadeloupe, MD  cetirizine (ZYRTEC) 10 MG tablet Take 10 mg by mouth daily.     Yes [provider]  cholecalciferol (VITAMIN D) 1000 UNITS tablet Take 1,000 Units by mouth daily.     Yes [provider]  Coenzyme Q10 (COQ10) 400 MG CAPS Take 400 mg by mouth daily.    Yes [provider]  Flaxseed, Linseed, (FLAX SEED OIL) 1000 MG CAPS Take 1 capsule by mouth daily.   Yes [provider]  fluconazole (DIFLUCAN) 100 MG tablet Take 1 tablet (100 mg total) by mouth daily. Take 2 tablets on day one and then 1 tablet daily x 3 weeks 01/24/17  Yes Sindy Guadeloupe, MD  lidocaine (LIDODERM) 5 % Place 1 patch onto the skin daily. Remove & Discard patch within 12 hours of use each day 10/26/16  Yes Sindy Guadeloupe, MD  lidocaine-prilocaine (EMLA) cream Apply 1 application topically as needed. Apply to port 2 hours prior to appt 11/03/16  Yes Sindy Guadeloupe, MD  LUMIGAN 0.01 % SOLN Place 1 drop into the right eye at bedtime. 10/31/16  Yes [provider]  megestrol (MEGACE) 400 MG/10ML suspension Take 10 mLs (400 mg total) by mouth daily. 02/08/17  Yes Sindy Guadeloupe, MD  Multiple Vitamin (MULTIVITAMIN) tablet Take 1 tablet by mouth daily.     Yes  [provider]  naloxone (NARCAN) nasal spray 4 mg/0.1 mL Place 1 spray into the nose daily as needed (opioid overdose).   Yes [provider]  OLANZapine (ZYPREXA) 10 MG tablet Take 1 tablet (10 mg total) by mouth at bedtime. 02/08/17  Yes Sindy Guadeloupe, MD  Omega-3 Fatty Acids (FISH OIL) 1200 MG CAPS Take 1,200 mg by mouth daily.    Yes [provider]  ondansetron (ZOFRAN) 4 MG tablet take 1 tablet by mouth every 6 hours if needed for nausea and vomiting 10/14/16  Yes [provider]  ondansetron (ZOFRAN-ODT) 4 MG disintegrating tablet Take 1 tablet (4 mg total) by mouth  every 8 (eight) hours as needed for nausea or vomiting. 01/11/17  Yes Sindy Guadeloupe, MD  Oxycodone HCl 10 MG TABS Take 1 tablet (10 mg total) by mouth every 6 (six) hours as needed. 02/09/17  Yes Cammie Sickle, MD  predniSONE (DELTASONE) 5 MG tablet Take 1 tablet (5 mg total) by mouth daily with breakfast. 02/08/17  Yes Sindy Guadeloupe, MD  Probiotic Product (PROBIOTIC DAILY PO) Take 1 capsule by mouth daily.   Yes [provider]  prochlorperazine (COMPAZINE) 10 MG tablet take 1 tablet by mouth every 6 hours if needed for nausea and vomiting 01/28/17  Yes Sindy Guadeloupe, MD  rosuvastatin (CRESTOR) 20 MG tablet Take one and one half tablets by mouth once daily for cholesterol Patient taking differently: Take 30 mg by mouth daily.  04/23/16  Yes Tower, Wynelle Fanny, MD  venlafaxine XR (EFFEXOR-XR) 75 MG 24 hr capsule take 1 capsule by mouth once daily 04/23/16  Yes Tower, Wynelle Fanny, MD  cyclobenzaprine (FLEXERIL) 5 MG tablet Take 1 tablet (5 mg total) by mouth 3 (three) times daily as needed for muscle spasms. Patient not taking: Reported on 02/01/2017 12/21/16   Sindy Guadeloupe, MD  fluticasone [facility]) 50 MCG/ACT nasal spray Place 2 sprays into both nostrils daily. Patient not taking: Reported on 02/27/2017 04/23/16 04/23/17  Tower, Wynelle Fanny, MD  HYDROcodone-acetaminophen (NORCO/VICODIN) 5-325 MG  tablet Take 1-2 tablets by mouth every 6 (six) hours as needed. Patient not taking: Reported on 02/27/2017 09/24/16   Tower, Wynelle Fanny, MD  nitrofurantoin, macrocrystal-monohydrate, (MACROBID) 100 MG capsule Take 1 capsule (100 mg total) by mouth 2 (two) times daily. Patient not taking: Reported on 02/08/2017 09/11/16   Norval Gable, MD  nystatin (MYCOSTATIN) 100000 UNIT/ML suspension take 5 milliliters by mouth four times a day Patient not taking: Reported on 02/08/2017 12/08/16   Sindy Guadeloupe, MD      PHYSICAL EXAMINATION:   VITAL SIGNS: Blood pressure (!) 110/50, pulse 88, temperature 97.7 F (36.5 C), temperature source Axillary, resp. rate 18, height 5\' 6"  (1.676 m), weight 138 lb (62.6 kg), SpO2 (P) 100 %.  GENERAL:  66 y.o.-year-old patient lying in the bed Critically ill-appearing EYES: Pupils equal, round, reactive to light and accommodation. No scleral icterus.  HEENT: Head atraumatic, normocephalic. Oropharynx and nasopharynx clear.  NECK:  Supple, no jugular venous distention. No thyroid enlargement, no tenderness.  LUNGS: Rhonchus breath sounds with bilateral wheezing CARDIOVASCULAR: S1, S2 normal. No murmurs, rubs, or gallops.  ABDOMEN: Soft, nontender, distended Bowel sounds present. No organomegaly or mass.  EXTREMITIES: 1+ pedal edema, cyanosis, or clubbing.  NEUROLOGIC: Limited due to patient unable to follow commands PSYCHIATRIC: The patient is drowsy and not oriented SKIN: No obvious rash, lesion, or ulcer.   LABORATORY PANEL:   CBC  Recent Labs Lab 02/08/17 0834 03/05/2017 0705  WBC 38.0* 25.8*  HGB 8.9* 7.0*  HCT 27.0* 21.8*  PLT 358 234  MCV 88.6 90.3  MCH 29.3 28.9  MCHC 33.0 32.0  RDW 19.1* 20.5*  LYMPHSABS 0.7* 0.0*  MONOABS 1.6* 0.0*  EOSABS 0.0 0.0  BASOSABS 0.1 0.0   ------------------------------------------------------------------------------------------------------------------  Chemistries   Recent Labs Lab 02/08/17 0834 02/25/2017 0705  NA  134* 128*  K 3.6 6.8*  CL 105 102  CO2 15* 9*  GLUCOSE 229* 315*  BUN 15 66*  CREATININE 1.61* 4.10*  CALCIUM 8.4* 7.1*  AST 79* 71*  ALT 37 27  ALKPHOS 738* 392*  BILITOT 0.7 0.7   ------------------------------------------------------------------------------------------------------------------  estimated creatinine clearance is 12.8 mL/min (A) (by C-G formula based on SCr of 4.1 mg/dL (H)). ------------------------------------------------------------------------------------------------------------------ No results for input(s): TSH, T4TOTAL, T3FREE, THYROIDAB in the last 72 hours.  Invalid input(s): FREET3   Coagulation profile  Recent Labs Lab 03/08/2017 0705  INR 1.61   ------------------------------------------------------------------------------------------------------------------- No results for input(s): DDIMER in the last 72 hours. -------------------------------------------------------------------------------------------------------------------  Cardiac Enzymes  Recent Labs Lab 02/19/2017 0705  TROPONINI 0.07*   ------------------------------------------------------------------------------------------------------------------ Invalid input(s): POCBNP  ---------------------------------------------------------------------------------------------------------------  Urinalysis    Component Value Date/Time   COLORURINE YELLOW 09/11/2016 1315   APPEARANCEUR Clear 09/14/2016 1427   LABSPEC 1.020 09/11/2016 1315   PHURINE 7.0 09/11/2016 1315   GLUCOSEU Negative 09/14/2016 1427   HGBUR TRACE (A) 09/11/2016 1315   BILIRUBINUR Negative 09/21/2016 1556   BILIRUBINUR Negative 09/14/2016 1427   KETONESUR NEGATIVE 09/11/2016 1315   PROTEINUR 100 mg/dL 09/21/2016 1556   PROTEINUR 2+ (A) 09/14/2016 1427   PROTEINUR 100 (A) 09/11/2016 1315   UROBILINOGEN 0.2 09/21/2016 1556   NITRITE Negative 09/21/2016 1556   NITRITE Negative 09/14/2016 1427   NITRITE NEGATIVE  09/11/2016 1315   LEUKOCYTESUR Negative 09/21/2016 1556   LEUKOCYTESUR Negative 09/14/2016 1427     RADIOLOGY: Dg Chest Port 1 View  Result Date: 03/08/2017 CLINICAL DATA:  Difficulty breathing. EXAM: PORTABLE CHEST 1 VIEW COMPARISON:  None. FINDINGS: Mild cardiomegaly is noted. No pneumothorax is noted. Right internal jugular Port-A-Cath is noted with distal tip in expected position of cavoatrial junction. Mild bibasilar subsegmental atelectasis is noted. Minimal left pleural effusion may be present. Bony thorax is unremarkable. IMPRESSION: Mild bibasilar subsegmental atelectasis. Minimal left pleural effusion. Electronically Signed   By: Marijo Conception, M.D.   On: 02/25/2017 08:01    EKG: Orders placed or performed in visit on 03/08/2017  . EKG 12-Lead  . EKG 12-Lead    IMPRESSION AND PLAN: Patient is a 66 year old white female with metastatic endocrine tumor  1. Acute respiratory failure Patient does have significant wheezing I will treat with IV steroids Continue BiPAP Broad spectrum antibiotics for possible pneumonia Patient will be going to the ICU I have D/w dr. Leonidas Romberg he will see in ICU Pulmonary embolism is in consideration however unable to do CT we will see what intensivist recommended  2. Acute renal failure with severe hyperkalemia suspect due to ATN Will start patient on bicarbonate drip Give 1 dose of calcium gluconate for her hyperkalemia I have discussed with Dr. Orpha Bur who will see the patient  3. Severe metabolic acidosis treat with sodium bicarbonate  4. Possible acute ST MI Due to her severe anemia will not be a candidate for heparin drip Echocardiogram of the heart Cardiology is following  5. Neuroendocrine tumor prognosis poor  6. Acute on chronic anemia suspect this is related to chemotherapy and underlying tumor Continue to monitor hemoglobin patient has been written to get transfused  7. Hypotension now resolved   8. CODE STATUS discussed with  the patient's family They still want her to be full code. I have explained to him that prognosis is very poor in light of the cancer. They state that they will reevaluate in 24 hours.       All the records are reviewed and case discussed with ED provider. Management plans discussed with the patient, family and they are in agreement.  CODE STATUS: Code Status History    This patient does not have a recorded code status. Please follow your organizational policy for patients in this situation.  Advance Directive Documentation     Most Recent Value  Type of Advance Directive  Living will  Pre-existing out of facility DNR order (yellow form or pink MOST form)  -  "MOST" Form in Place?  -       TOTAL TIME TAKING CARE OF THIS PATIENT: 80 minutes critical care time spent d/w intensivist and  nephrloogy   Dustin Flock M.D on 02/27/2017 at 10:13 AM  Between 7am to 6pm - Pager - [phone]  After 6pm go to [email or web address] - password [email or web address] [facility]  [facility]  Office  [phone]  CC: Primary care physician; Tower, Wynelle Fanny, MD

## 2017-02-12 NOTE — ED Notes (Signed)
MD Lord at bedside. 

## 2017-02-12 NOTE — Progress Notes (Signed)
PHARMACIST - PHYSICIAN COMMUNICATION  CONCERNING:  Enoxaparin (Lovenox) for DVT Prophylaxis    RECOMMENDATION: Patient was prescribed enoxaprin 40mg  q24 hours for VTE prophylaxis.   Filed Weights   02/15/2017 0713  Weight: 138 lb (62.6 kg)    Body mass index is 22.27 kg/m.  Estimated Creatinine Clearance: 12.8 mL/min (A) (by C-G formula based on SCr of 4.1 mg/dL (H)).   Based on [facility] patient is candidate for heparin subq 5000 U q 8 hours based on CrCl <15 ml/min   DESCRIPTION: Pharmacy has changed enoxaparin to heparin per [facility] policy.  Patient is now receiving heparin subq 5000 U q 8 hours  Darrow Bussing, PharmD Pharmacy Resident 02/20/2017 12:48 PM

## 2017-02-12 NOTE — ED Notes (Signed)
Chaplain at bedside

## 2017-02-12 NOTE — ED Notes (Signed)
X-ray at bedside

## 2017-02-12 NOTE — Progress Notes (Signed)
PT with Afib with rates as high as 160bpm.  PAged Dr. Mackey Birchwood, no returned call at this time.

## 2017-02-12 NOTE — ED Notes (Signed)
PT covered with multiple warm blankets.

## 2017-02-13 ENCOUNTER — Inpatient Hospital Stay: Payer: Medicare Other

## 2017-02-13 DIAGNOSIS — D649 Anemia, unspecified: Secondary | ICD-10-CM

## 2017-02-13 DIAGNOSIS — R4182 Altered mental status, unspecified: Secondary | ICD-10-CM

## 2017-02-13 DIAGNOSIS — C7A1 Malignant poorly differentiated neuroendocrine tumors: Secondary | ICD-10-CM

## 2017-02-13 DIAGNOSIS — Z923 Personal history of irradiation: Secondary | ICD-10-CM

## 2017-02-13 DIAGNOSIS — C7951 Secondary malignant neoplasm of bone: Secondary | ICD-10-CM

## 2017-02-13 DIAGNOSIS — J9600 Acute respiratory failure, unspecified whether with hypoxia or hypercapnia: Secondary | ICD-10-CM

## 2017-02-13 DIAGNOSIS — I959 Hypotension, unspecified: Secondary | ICD-10-CM

## 2017-02-13 DIAGNOSIS — Z9221 Personal history of antineoplastic chemotherapy: Secondary | ICD-10-CM

## 2017-02-13 DIAGNOSIS — R200 Anesthesia of skin: Secondary | ICD-10-CM

## 2017-02-13 DIAGNOSIS — C787 Secondary malignant neoplasm of liver and intrahepatic bile duct: Secondary | ICD-10-CM

## 2017-02-13 DIAGNOSIS — C778 Secondary and unspecified malignant neoplasm of lymph nodes of multiple regions: Secondary | ICD-10-CM

## 2017-02-13 DIAGNOSIS — R4789 Other speech disturbances: Secondary | ICD-10-CM

## 2017-02-13 DIAGNOSIS — R5383 Other fatigue: Secondary | ICD-10-CM

## 2017-02-13 DIAGNOSIS — C7B8 Other secondary neuroendocrine tumors: Secondary | ICD-10-CM

## 2017-02-13 DIAGNOSIS — I318 Other specified diseases of pericardium: Secondary | ICD-10-CM

## 2017-02-13 DIAGNOSIS — I313 Pericardial effusion (noninflammatory): Secondary | ICD-10-CM

## 2017-02-13 DIAGNOSIS — I4891 Unspecified atrial fibrillation: Secondary | ICD-10-CM

## 2017-02-13 LAB — RENAL FUNCTION PANEL
ALBUMIN: 1.9 g/dL — AB (ref 3.5–5.0)
ANION GAP: 16 — AB (ref 5–15)
ANION GAP: 14 (ref 5–15)
Albumin: 1.8 g/dL — ABNORMAL LOW (ref 3.5–5.0)
Albumin: 2 g/dL — ABNORMAL LOW (ref 3.5–5.0)
Albumin: 1.8 g/dL — ABNORMAL LOW (ref 3.5–5.0)
Anion gap: 13 (ref 5–15)
Anion gap: 16 — ABNORMAL HIGH (ref 5–15)
BUN: 40 mg/dL — AB (ref 6–20)
BUN: 27 mg/dL — ABNORMAL HIGH (ref 6–20)
BUN: 28 mg/dL — ABNORMAL HIGH (ref 6–20)
BUN: 31 mg/dL — AB (ref 6–20)
CALCIUM: 6.9 mg/dL — AB (ref 8.9–10.3)
CALCIUM: 7.2 mg/dL — AB (ref 8.9–10.3)
CALCIUM: 7.3 mg/dL — AB (ref 8.9–10.3)
CALCIUM: 7.2 mg/dL — AB (ref 8.9–10.3)
CHLORIDE: 99 mmol/L — AB (ref 101–111)
CHLORIDE: 96 mmol/L — AB (ref 101–111)
CO2: 21 mmol/L — AB (ref 22–32)
CO2: 21 mmol/L — ABNORMAL LOW (ref 22–32)
CO2: 22 mmol/L (ref 22–32)
CO2: 21 mmol/L — AB (ref 22–32)
CREATININE: 2.22 mg/dL — AB (ref 0.44–1.00)
CREATININE: 2.07 mg/dL — AB (ref 0.44–1.00)
Chloride: 94 mmol/L — ABNORMAL LOW (ref 101–111)
Chloride: 97 mmol/L — ABNORMAL LOW (ref 101–111)
Creatinine, Ser: 1.75 mg/dL — ABNORMAL HIGH (ref 0.44–1.00)
Creatinine, Ser: 1.86 mg/dL — ABNORMAL HIGH (ref 0.44–1.00)
GFR calc Af Amer: 28 mL/min — ABNORMAL LOW (ref >60)
GFR calc non Af Amer: 27 mL/min — ABNORMAL LOW (ref >60)
GFR calc non Af Amer: 24 mL/min — ABNORMAL LOW (ref >60)
GFR, EST AFRICAN AMERICAN: 26 mL/min — AB (ref >60)
GFR, EST AFRICAN AMERICAN: 34 mL/min — AB (ref >60)
GFR, EST AFRICAN AMERICAN: 32 mL/min — AB (ref >60)
GFR, EST NON AFRICAN AMERICAN: 22 mL/min — AB (ref >60)
GFR, EST NON AFRICAN AMERICAN: 29 mL/min — AB (ref >60)
GLUCOSE: 234 mg/dL — AB (ref 65–99)
GLUCOSE: 147 mg/dL — AB (ref 65–99)
Glucose, Bld: 121 mg/dL — ABNORMAL HIGH (ref 65–99)
Glucose, Bld: 131 mg/dL — ABNORMAL HIGH (ref 65–99)
PHOSPHORUS: 4.3 mg/dL (ref 2.5–4.6)
Phosphorus: 4.2 mg/dL (ref 2.5–4.6)
Phosphorus: 5.3 mg/dL — ABNORMAL HIGH (ref 2.5–4.6)
Phosphorus: 4.8 mg/dL — ABNORMAL HIGH (ref 2.5–4.6)
Potassium: 3.5 mmol/L (ref 3.5–5.1)
Potassium: 3.6 mmol/L (ref 3.5–5.1)
Potassium: 4 mmol/L (ref 3.5–5.1)
Potassium: 3.8 mmol/L (ref 3.5–5.1)
SODIUM: 131 mmol/L — AB (ref 135–145)
SODIUM: 133 mmol/L — AB (ref 135–145)
SODIUM: 133 mmol/L — AB (ref 135–145)
Sodium: 133 mmol/L — ABNORMAL LOW (ref 135–145)

## 2017-02-13 LAB — COMPREHENSIVE METABOLIC PANEL
ALT: 30 U/L (ref 14–54)
ANION GAP: 13 (ref 5–15)
AST: 99 U/L — ABNORMAL HIGH (ref 15–41)
Albumin: 2 g/dL — ABNORMAL LOW (ref 3.5–5.0)
Alkaline Phosphatase: 434 U/L — ABNORMAL HIGH (ref 38–126)
BILIRUBIN TOTAL: 0.9 mg/dL (ref 0.3–1.2)
BUN: 33 mg/dL — ABNORMAL HIGH (ref 6–20)
CO2: 23 mmol/L (ref 22–32)
Calcium: 7.2 mg/dL — ABNORMAL LOW (ref 8.9–10.3)
Chloride: 96 mmol/L — ABNORMAL LOW (ref 101–111)
Creatinine, Ser: 1.89 mg/dL — ABNORMAL HIGH (ref 0.44–1.00)
GFR calc Af Amer: 31 mL/min — ABNORMAL LOW (ref >60)
GFR calc non Af Amer: 27 mL/min — ABNORMAL LOW (ref >60)
Glucose, Bld: 121 mg/dL — ABNORMAL HIGH (ref 65–99)
POTASSIUM: 3.7 mmol/L (ref 3.5–5.1)
Sodium: 132 mmol/L — ABNORMAL LOW (ref 135–145)
TOTAL PROTEIN: 5.9 g/dL — AB (ref 6.5–8.1)

## 2017-02-13 LAB — ECHOCARDIOGRAM COMPLETE
Height: 66 in
Weight: 2208 oz

## 2017-02-13 LAB — BLOOD CULTURE ID PANEL (REFLEXED)
Acinetobacter baumannii: NOT DETECTED
CANDIDA KRUSEI: NOT DETECTED
CANDIDA PARAPSILOSIS: NOT DETECTED
Candida albicans: NOT DETECTED
Candida glabrata: NOT DETECTED
Candida tropicalis: NOT DETECTED
ENTEROCOCCUS SPECIES: NOT DETECTED
ESCHERICHIA COLI: NOT DETECTED
Enterobacter cloacae complex: NOT DETECTED
Enterobacteriaceae species: NOT DETECTED
Haemophilus influenzae: NOT DETECTED
KLEBSIELLA OXYTOCA: NOT DETECTED
Klebsiella pneumoniae: NOT DETECTED
LISTERIA MONOCYTOGENES: NOT DETECTED
Methicillin resistance: NOT DETECTED
Neisseria meningitidis: NOT DETECTED
PROTEUS SPECIES: NOT DETECTED
Pseudomonas aeruginosa: NOT DETECTED
SERRATIA MARCESCENS: NOT DETECTED
STAPHYLOCOCCUS AUREUS BCID: DETECTED — AB
STAPHYLOCOCCUS SPECIES: DETECTED — AB
Streptococcus agalactiae: NOT DETECTED
Streptococcus pneumoniae: NOT DETECTED
Streptococcus pyogenes: NOT DETECTED
Streptococcus species: NOT DETECTED

## 2017-02-13 LAB — BPAM RBC
Blood Product Expiration Date: 201805112359
Blood Product Expiration Date: 201805302359
Blood Product Expiration Date: 201805312359
ISSUE DATE / TIME: 201805050802
ISSUE DATE / TIME: 201805050802
ISSUE DATE / TIME: 201805050905
UNIT TYPE AND RH: 5100
UNIT TYPE AND RH: 5100
Unit Type and Rh: 9500

## 2017-02-13 LAB — TROPONIN I
TROPONIN I: 0.06 ng/mL — AB (ref <0.03)
Troponin I: 0.06 ng/mL (ref <0.03)

## 2017-02-13 LAB — TYPE AND SCREEN
ABO/RH(D): AB POS
Antibody Screen: NEGATIVE
UNIT DIVISION: 0
Unit division: 0
Unit division: 0

## 2017-02-13 LAB — CBC
HEMATOCRIT: 33.8 % — AB (ref 35.0–47.0)
Hemoglobin: 11.8 g/dL — ABNORMAL LOW (ref 12.0–16.0)
MCH: 27.2 pg (ref 26.0–34.0)
MCHC: 34.8 g/dL (ref 32.0–36.0)
MCV: 78.2 fL — AB (ref 80.0–100.0)
Platelets: 93 10*3/uL — ABNORMAL LOW (ref 150–440)
RBC: 4.33 MIL/uL (ref 3.80–5.20)
RDW: 20.3 % — AB (ref 11.5–14.5)
WBC: 5.1 10*3/uL (ref 3.6–11.0)

## 2017-02-13 LAB — PROCALCITONIN: Procalcitonin: 4.2 ng/mL

## 2017-02-13 LAB — MAGNESIUM
MAGNESIUM: 1.8 mg/dL (ref 1.7–2.4)
MAGNESIUM: 1.8 mg/dL (ref 1.7–2.4)
Magnesium: 1.8 mg/dL (ref 1.7–2.4)
Magnesium: 1.8 mg/dL (ref 1.7–2.4)

## 2017-02-13 LAB — GLUCOSE, CAPILLARY
GLUCOSE-CAPILLARY: 141 mg/dL — AB (ref 65–99)
GLUCOSE-CAPILLARY: 93 mg/dL (ref 65–99)
Glucose-Capillary: 113 mg/dL — ABNORMAL HIGH (ref 65–99)
Glucose-Capillary: 116 mg/dL — ABNORMAL HIGH (ref 65–99)
Glucose-Capillary: 130 mg/dL — ABNORMAL HIGH (ref 65–99)
Glucose-Capillary: 125 mg/dL — ABNORMAL HIGH (ref 65–99)

## 2017-02-13 LAB — T4, FREE: FREE T4: 0.69 ng/dL (ref 0.61–1.12)

## 2017-02-13 LAB — APTT: APTT: 34 s (ref 24–36)

## 2017-02-13 LAB — BRAIN NATRIURETIC PEPTIDE: B Natriuretic Peptide: 260 pg/mL — ABNORMAL HIGH (ref 0.0–100.0)

## 2017-02-13 LAB — TSH: TSH: 0.508 u[IU]/mL (ref 0.350–4.500)

## 2017-02-13 MED ORDER — METOPROLOL TARTRATE 5 MG/5ML IV SOLN
2.5000 mg | INTRAVENOUS | Status: DC | PRN
  Administered 2017-02-13 (×3): 5 mg via INTRAVENOUS
  Filled 2017-02-13 (×3): qty 5

## 2017-02-13 MED ORDER — DIGOXIN 0.25 MG/ML IJ SOLN
0.2500 mg | Freq: Once | INTRAMUSCULAR | Status: AC
  Administered 2017-02-13: 0.25 mg via INTRAVENOUS
  Filled 2017-02-13: qty 2

## 2017-02-13 MED ORDER — FENTANYL CITRATE (PF) 100 MCG/2ML IJ SOLN
25.0000 ug | INTRAMUSCULAR | Status: DC | PRN
  Filled 2017-02-13: qty 2

## 2017-02-13 MED ORDER — NALOXONE HCL 0.4 MG/ML IJ SOLN
INTRAMUSCULAR | Status: AC
  Administered 2017-02-13: 2 mg
  Filled 2017-02-13: qty 1

## 2017-02-13 MED ORDER — GLYCOPYRROLATE 0.2 MG/ML IJ SOLN
0.1000 mg | INTRAMUSCULAR | Status: DC | PRN
  Administered 2017-02-13 – 2017-02-14 (×5): 0.1 mg via INTRAVENOUS
  Filled 2017-02-13 (×6): qty 1

## 2017-02-13 MED ORDER — METHYLPREDNISOLONE SODIUM SUCC 40 MG IJ SOLR
40.0000 mg | Freq: Two times a day (BID) | INTRAMUSCULAR | Status: DC
  Administered 2017-02-13: 40 mg via INTRAVENOUS
  Filled 2017-02-13: qty 1

## 2017-02-13 MED ORDER — FENTANYL CITRATE (PF) 100 MCG/2ML IJ SOLN
75.0000 ug | Freq: Once | INTRAMUSCULAR | Status: AC
  Administered 2017-02-13: 25 ug via INTRAVENOUS
  Filled 2017-02-13: qty 2

## 2017-02-13 MED ORDER — HALOPERIDOL LACTATE 5 MG/ML IJ SOLN
INTRAMUSCULAR | Status: AC
  Administered 2017-02-13: 2 mg via INTRAVENOUS
  Filled 2017-02-13: qty 1

## 2017-02-13 MED ORDER — MORPHINE SULFATE (CONCENTRATE) 10 MG/0.5ML PO SOLN
10.0000 mg | ORAL | Status: DC | PRN
  Administered 2017-02-13 (×2): 10 mg via SUBLINGUAL
  Filled 2017-02-13 (×3): qty 1

## 2017-02-13 MED ORDER — HALOPERIDOL LACTATE 5 MG/ML IJ SOLN: 1.0000 mg | INTRAMUSCULAR | Status: DC | PRN

## 2017-02-13 MED ORDER — HALOPERIDOL LACTATE 5 MG/ML IJ SOLN
1.0000 mg | INTRAMUSCULAR | Status: DC | PRN
  Administered 2017-02-13: 2 mg via INTRAVENOUS

## 2017-02-13 MED ORDER — PUREFLOW DIALYSIS SOLUTION
INTRAVENOUS | Status: DC
  Administered 2017-02-13: 3 via INTRAVENOUS_CENTRAL
  Administered 2017-02-13: 09:00:00 via INTRAVENOUS_CENTRAL

## 2017-02-13 MED ORDER — INSULIN ASPART 100 UNIT/ML ~~LOC~~ SOLN
0.0000 [IU] | SUBCUTANEOUS | Status: DC
  Administered 2017-02-13 (×3): 1 [IU] via SUBCUTANEOUS
  Filled 2017-02-13 (×3): qty 1

## 2017-02-13 MED ORDER — LORAZEPAM 2 MG/ML IJ SOLN: 0.5000 mg | INTRAMUSCULAR | Status: DC | PRN

## 2017-02-13 MED ORDER — FENTANYL CITRATE (PF) 100 MCG/2ML IJ SOLN
12.5000 ug | INTRAMUSCULAR | Status: DC | PRN
  Administered 2017-02-13: 25 ug via INTRAVENOUS
  Administered 2017-02-13 – 2017-02-14 (×5): 12.5 ug via INTRAVENOUS
  Filled 2017-02-13 (×6): qty 2

## 2017-02-13 MED ORDER — METOPROLOL TARTRATE 5 MG/5ML IV SOLN: 2.5000 mg | INTRAVENOUS | Status: DC | PRN

## 2017-02-13 MED ORDER — NALOXONE HCL 2 MG/2ML IJ SOSY
2.0000 mg | PREFILLED_SYRINGE | Freq: Once | INTRAMUSCULAR | Status: DC
  Filled 2017-02-13: qty 2

## 2017-02-13 NOTE — Progress Notes (Signed)
[facility]                                                                                                                                                                                  Patient Demographics   Sharon Hester, is a 66 y.o. female, DOB - Feb 13, 1951, ASN:[id number]  Admit date - 02/11/2017   Admitting Physician Dustin Flock, MD  Outpatient Primary MD for the patient is Tower, Wynelle Fanny, MD   LOS - 1  Subjective:Patient more awake today. Noted to be in A. fib with RVR She is complaining of cough and congestion.    Review of Systems:   CONSTITUTIONAL: No documented fever. Positive fatigue, weakness. No weight gain, no weight loss.  EYES: No blurry or double vision.  ENT: No tinnitus. No postnasal drip. No redness of the oropharynx.  RESPIRATORY: No cough, no wheeze, no hemoptysis. Positive dyspnea.  CARDIOVASCULAR: No chest pain. No orthopnea. No palpitations. No syncope.  GASTROINTESTINAL: No nausea, no vomiting or diarrhea. No abdominal pain. No melena or hematochezia.  GENITOURINARY: No dysuria or hematuria.  ENDOCRINE: No polyuria or nocturia. No heat or cold intolerance.  HEMATOLOGY: No anemia. No bruising. No bleeding.  INTEGUMENTARY: No rashes. No lesions.  MUSCULOSKELETAL: No arthritis. No swelling. No gout.  NEUROLOGIC: No numbness, tingling, or ataxia. No seizure-type activity.  PSYCHIATRIC: No anxiety. No insomnia. No ADD.    Vitals:   Vitals:   02/13/17 1000 02/13/17 1100 02/13/17 1200 02/13/17 1300  BP: 107/86 129/78 107/76 118/88  Pulse: (!) 114 (!) 129 (!) 120 (!) 128  Resp: (!) 28 19 17 20   Temp: 99 F (37.2 C) 99 F (37.2 C) 99.1 F (37.3 C) 99.5 F (37.5 C)  TempSrc:   Core (Comment)   SpO2: (!) 82% (!) 89% 92% 90%  Weight:      Height:        Wt Readings from Last 3 Encounters:  02/11/2017 138 lb (62.6 kg)  02/08/17 138 lb 6.4 oz (62.8 kg)  02/01/17 141 lb 9.6 oz (64.2 kg)     Intake/Output  Summary (Last 24 hours) at 02/13/17 1311 Last data filed at 02/13/17 1300  Gross per 24 hour  Intake          2624.74 ml  Output              839 ml  Net          1785.74 ml    Physical Exam:   GENERAL:Critically ill-appearing pale HEAD, EYES, EARS, NOSE AND THROAT: Atraumatic, normocephalic. Extraocular muscles are intact. Pupils equal and reactive to light. Sclerae anicteric. No conjunctival injection. No oro-pharyngeal  erythema.  NECK: Supple. There is no jugular venous distention. No bruits, no lymphadenopathy, no thyromegaly.  HEART: Irregularly irregular. No murmurs, no rubs, no clicks.  LUNGS: Rhonchus breath sounds bilaterally  ABDOMEN: Soft, flat, nontender, nondistended. Has good bowel sounds. No hepatosplenomegaly appreciated.  EXTREMITIES: No evidence of any cyanosis, clubbing, or peripheral edema.  +2 pedal and radial pulses bilaterally.  NEUROLOGIC: The patient is alert, awake, and oriented x3 with no focal motor or sensory deficits appreciated bilaterally.  SKIN: Moist and warm with no rashes appreciated.  Psych: Not anxious, depressed LN: No inguinal LN enlargement    Antibiotics   Anti-infectives    Start     Dose/Rate Route Frequency Ordered Stop   02/13/17 1000  ceFEPIme (MAXIPIME) 2 g in dextrose 5 % 50 mL IVPB  Status:  Discontinued     2 g 100 mL/hr over 30 Minutes Intravenous Every 24 hours 02/21/2017 1211 03/04/2017 1321   02/13/17 0800  vancomycin (VANCOCIN) IVPB 1000 mg/200 mL premix     1,000 mg 200 mL/hr over 60 Minutes Intravenous Every 24 hours 02/23/2017 1340     02/22/2017 2200  ceFEPIme (MAXIPIME) 2 g in dextrose 5 % 50 mL IVPB     2 g 100 mL/hr over 30 Minutes Intravenous Every 12 hours 03/04/2017 1321     02/26/2017 0800  ceFEPIme (MAXIPIME) 2 g in dextrose 5 % 50 mL IVPB  Status:  Discontinued     2 g 100 mL/hr over 30 Minutes Intravenous  Once 03/02/2017 0740 03/09/2017 0754   02/28/2017 0800  ceFEPIme (MAXIPIME) 2 g in dextrose 5 % 50 mL IVPB     2 g 100  mL/hr over 30 Minutes Intravenous  Once 02/16/2017 0744 02/18/2017 1030   02/28/2017 0745  vancomycin (VANCOCIN) IVPB 1000 mg/200 mL premix     1,000 mg 200 mL/hr over 60 Minutes Intravenous  Once 02/10/2017 0740 02/18/2017 1030   02/23/2017 0745  azithromycin (ZITHROMAX) 500 mg in dextrose 5 % 250 mL IVPB     500 mg 250 mL/hr over 60 Minutes Intravenous  Once 02/17/2017 0740 02/27/2017 0918      Medications   Scheduled Meds: . budesonide (PULMICORT) nebulizer solution  0.25 mg Nebulization Q6H  . chlorhexidine  15 mL Mouth Rinse BID  . heparin subcutaneous  5,000 Units Subcutaneous Q8H  . insulin aspart  0-9 Units Subcutaneous Q4H  . ipratropium-albuterol  3 mL Nebulization Q6H  . latanoprost  1 drop Right Eye QHS  . mouth rinse  15 mL Mouth Rinse q12n4p  . methylPREDNISolone (SOLU-MEDROL) injection  40 mg Intravenous Q12H  . pantoprazole (PROTONIX) IV  40 mg Intravenous Q24H  . sodium chloride flush  10-40 mL Intracatheter Q12H   Continuous Infusions: . sodium chloride Stopped (02/13/2017 1253)  . sodium chloride Stopped (02/13/2017 1253)  . amiodarone 30 mg/hr (02/13/17 0700)  . ceFEPIme (MAXIPIME) 2 GM IVP Stopped (02/13/17 1056)  . pureflow 2,000 mL/hr at 02/13/17 0915  . vancomycin Stopped (02/13/17 0945)   PRN Meds:.acetaminophen **OR** acetaminophen, fentaNYL (SUBLIMAZE) injection, heparin, LORazepam, metoprolol, [DISCONTINUED] ondansetron **OR** ondansetron (ZOFRAN) IV, sodium chloride flush   Data Review:   Micro Results Recent Results (from the past 240 hour(s))  Culture, blood (routine x 2)     Status: None (Preliminary result)   Collection Time: 03/04/2017  7:05 AM  Result Value Ref Range Status   Specimen Description BLOOD LEFT NECK LINE  Final   Special Requests   Final    BOTTLES  DRAWN AEROBIC AND ANAEROBIC Blood Culture results may not be optimal due to an excessive volume of blood received in culture bottles   Culture NO GROWTH <12 HOURS  Final   Report Status PENDING   Incomplete  Culture, blood (routine x 2)     Status: None (Preliminary result)   Collection Time: 02/11/2017  7:49 AM  Result Value Ref Range Status   Specimen Description BLOOD LEFT HAND  Final   Special Requests   Final    BOTTLES DRAWN AEROBIC AND ANAEROBIC Blood Culture results may not be optimal due to an excessive volume of blood received in culture bottles   Culture NO GROWTH <12 HOURS  Final   Report Status PENDING  Incomplete  MRSA PCR Screening     Status: None   Collection Time: 02/25/2017  3:34 PM  Result Value Ref Range Status   MRSA by PCR NEGATIVE NEGATIVE Final    Comment:        The GeneXpert MRSA Assay (FDA approved for NASAL specimens only), is one component of a comprehensive MRSA colonization surveillance program. It is not intended to diagnose MRSA infection nor to guide or monitor treatment for MRSA infections.     Radiology Reports Dg Chest Port 1 View  Result Date: 02/13/2017 CLINICAL DATA:  Respiratory failure. EXAM: PORTABLE CHEST 1 VIEW COMPARISON:  02/27/2017 FINDINGS: Right chest wall port a catheter tip is at the cavoatrial junction. Stable cardiac enlargement. Bilateral pleural effusions and mild interstitial edema identified. This appears slightly progressive when compared with previous exam. IMPRESSION: Mild increase in CHF. Electronically Signed   By: Kerby Moors M.D.   On: 02/13/2017 07:15   Dg Chest Port 1 View  Result Date: 02/23/2017 CLINICAL DATA:  Difficulty breathing. EXAM: PORTABLE CHEST 1 VIEW COMPARISON:  None. FINDINGS: Mild cardiomegaly is noted. No pneumothorax is noted. Right internal jugular Port-A-Cath is noted with distal tip in expected position of cavoatrial junction. Mild bibasilar subsegmental atelectasis is noted. Minimal left pleural effusion may be present. Bony thorax is unremarkable. IMPRESSION: Mild bibasilar subsegmental atelectasis. Minimal left pleural effusion. Electronically Signed   By: Marijo Conception, M.D.   On:  03/01/2017 08:01     CBC  Recent Labs Lab 02/08/17 0834 03/04/2017 0705 03/06/2017 1748 03/08/2017 2214 02/13/17 0531  WBC 38.0* 25.8* 13.5* 9.6 5.1  HGB 8.9* 7.0* 11.8* 11.7* 11.8*  HCT 27.0* 21.8* 34.6* 34.1* 33.8*  PLT 358 234 135* 121* 93*  MCV 88.6 90.3 80.2 79.0* 78.2*  MCH 29.3 28.9 27.3 27.1 27.2  MCHC 33.0 32.0 34.0 34.3 34.8  RDW 19.1* 20.5* 20.5* 19.7* 20.3*  LYMPHSABS 0.7* 0.0*  --   --   --   MONOABS 1.6* 0.0*  --   --   --   EOSABS 0.0 0.0  --   --   --   BASOSABS 0.1 0.0  --   --   --     Chemistries   Recent Labs Lab 02/08/17 0834 03/09/2017 0705  02/22/2017 1748 02/13/2017 2125 02/13/17 0134 02/13/17 0531 02/13/17 1040 02/13/17 1041  NA 134* 128*  < > 130* 131* 133* 132*  --  131*  K 3.6 6.8*  < > 4.9 4.0 3.5 3.7  --  3.6  CL 105 102  < > 99* 97* 99* 96*  --  94*  CO2 15* 9*  < > 15* 17* 21* 23  --  21*  GLUCOSE 229* 315*  < > 222* 102* 121* 121*  --  234*  BUN 15 66*  < > 60* 45* 40* 33*  --  27*  CREATININE 1.61* 4.10*  < > 3.34* 2.53* 2.22* 1.89*  --  1.75*  CALCIUM 8.4* 7.1*  < > 6.9* 6.8* 6.9* 7.2*  --  7.2*  MG  --   --   < > 2.1 1.8 1.8 1.8 1.8  --   AST 79* 71*  --   --   --   --  99*  --   --   ALT 37 27  --   --   --   --  30  --   --   ALKPHOS 738* 392*  --   --   --   --  434*  --   --   BILITOT 0.7 0.7  --   --   --   --  0.9  --   --   < > = values in this interval not displayed. ------------------------------------------------------------------------------------------------------------------ estimated creatinine clearance is 30 mL/min (A) (by C-G formula based on SCr of 1.75 mg/dL (H)). ------------------------------------------------------------------------------------------------------------------ No results for input(s): HGBA1C in the last 72 hours. ------------------------------------------------------------------------------------------------------------------  Recent Labs  02/23/2017 0705  CHOL 105  HDL 20*  LDLCALC 43  TRIG 211*   CHOLHDL 5.3   ------------------------------------------------------------------------------------------------------------------  Recent Labs  02/13/17 1115  TSH 0.508   ------------------------------------------------------------------------------------------------------------------ No results for input(s): VITAMINB12, FOLATE, FERRITIN, TIBC, IRON, RETICCTPCT in the last 72 hours.  Coagulation profile  Recent Labs Lab 02/11/2017 0705  INR 1.61    No results for input(s): DDIMER in the last 72 hours.  Cardiac Enzymes  Recent Labs Lab 03/10/2017 1920 02/13/17 0134 02/13/17 0759  TROPONINI 0.07* 0.06* 0.06*   ------------------------------------------------------------------------------------------------------------------ Invalid input(s): POCBNP    Assessment & Plan   1 Acute respiratory failure Improved continue 5 L of oxygen Intensivist following   2. Acute renal failure with severe hyperkalemia suspect due to ATN Appreciate nephrology input Started on CRRT Patient's potassium is improved  3. Severe metabolic acidosis improved  4. Possible acute ST MI Echocardiogram with pericardial effusion being followed by cardiology further recommendations per them   5. Neuroendocrine tumor prognosis poor seen by oncology  6. Acute on chronic anemia suspect this is related to chemotherapy and underlying tumor Hemoglobin stable  7. Hypotension due to sepsis related to UTI and possible pneumonia continue antibiotics      Code Status Orders        Start     Ordered   03/08/2017 1220  Full code  Continuous     03/02/2017 1219    Code Status History    Date Active Date Inactive Code Status Order ID Comments User Context   This patient has a current code status but no historical code status.    Advance Directive Documentation     Most Recent Value  Type of Advance Directive  Living will  Pre-existing out of facility DNR order (yellow form or pink MOST  form)  -  "MOST" Form in Place?  -           Consults  Nephrology, intensivists, oncology   DVT Prophylaxis  SCDs  Lab Results  Component Value Date   PLT 93 (L) 02/13/2017     Time Spent in minutes 21min  Greater than 50% of time spent in care coordination and counseling patient regarding the condition and plan of care.   Dustin Flock M.D on 02/13/2017 at 1:11 PM  Between 7am to 6pm - Pager -  [phone]  After 6pm go to [email or web address] - password [email or web address] [facility]   Office  [phone]

## 2017-02-13 NOTE — Plan of Care (Signed)
Problem: Fluid Volume: Goal: Fluid volume balance will be maintained or improved Outcome: Progressing UF rate was started on CRRT, enabling fluid to be removed. Patient has tolerated this well.

## 2017-02-13 NOTE — Progress Notes (Signed)
Paged Dr. Juleen China to relay results from 0130 Renal panel. Will continue to monitor pt. Closely.

## 2017-02-13 NOTE — Progress Notes (Signed)
Progress Note  Patient Name: Sharon Hester Date of Encounter: 02/13/2017  Primary Cardiologist: Seen by Dr. Fletcher Anon on initial consultation  Subjective   Patient denies chest pain. Breathing is somewhat improved. She continues to have difficulty with speech, slurring.   Yesterday's events noted: Developed atrial fibrillation. Started on amiodarone drip. No anticoagulation due to significant thrombocytopenia. She has been maintaining her blood pressure. Undergoing CRRT.  Inpatient Medications    Scheduled Meds: . budesonide (PULMICORT) nebulizer solution  0.25 mg Nebulization Q6H  . chlorhexidine  15 mL Mouth Rinse BID  . heparin subcutaneous  5,000 Units Subcutaneous Q8H  . insulin aspart  0-9 Units Subcutaneous Q4H  . ipratropium-albuterol  3 mL Nebulization Q6H  . latanoprost  1 drop Right Eye QHS  . mouth rinse  15 mL Mouth Rinse q12n4p  . methylPREDNISolone (SOLU-MEDROL) injection  40 mg Intravenous Q12H  . pantoprazole (PROTONIX) IV  40 mg Intravenous Q24H  . sodium chloride flush  10-40 mL Intracatheter Q12H   Continuous Infusions: . sodium chloride Stopped (02/22/2017 1253)  . sodium chloride Stopped (02/10/2017 1253)  . amiodarone 30 mg/hr (02/13/17 0700)  . ceFEPIme (MAXIPIME) 2 GM IVP Stopped (02/13/17 1056)  . pureflow 2,000 mL/hr at 02/13/17 0915  . vancomycin Stopped (02/13/17 0945)   PRN Meds: acetaminophen **OR** acetaminophen, fentaNYL (SUBLIMAZE) injection, heparin, LORazepam, metoprolol, [DISCONTINUED] ondansetron **OR** ondansetron (ZOFRAN) IV, sodium chloride flush   Vital Signs    Vitals:   02/13/17 0900 02/13/17 0902 02/13/17 1000 02/13/17 1100  BP:  99/81 107/86 129/78  Pulse: (!) 117 (!) 124 (!) 114 (!) 129  Resp: (!) 28 (!) 32 (!) 28 19  Temp: 99.1 F (37.3 C) 99.1 F (37.3 C) 99 F (37.2 C) 99 F (37.2 C)  TempSrc:      SpO2: (!) 87% 95% (!) 82% (!) 89%  Weight:      Height:        Intake/Output Summary (Last 24 hours) at 02/13/17  1110 Last data filed at 02/13/17 1100  Gross per 24 hour  Intake          2574.64 ml  Output              647 ml  Net          1927.64 ml   Filed Weights   02/18/2017 0713  Weight: 138 lb (62.6 kg)    Telemetry    Atrial fibrillation, ventricular rate more recently in the 1 teens and 120s - Personally Reviewed  ECG   02/13/2017 personally reviewed: Atrial fibrillation, ventricular rate 1.6 BPM, low voltage, nonspecific ST-T wave changes   Physical Exam  PHYSICAL EXAM: VS:  BP 129/78   Pulse (!) 129   Temp 99 F (37.2 C)   Resp 19   Ht 5\' 6"  (1.676 m)   Wt 138 lb (62.6 kg)   SpO2 (!) 89%   BMI 22.27 kg/m  , BMI Body mass index is 22.27 kg/m. GENERAL:  well developed, well nourished, not in acute distress HEENT: normocephalic, pink conjunctivae, anicteric sclerae, no xanthelasma, normal dentition, oropharynx clear NECK:  no neck vein engorgement, JVP normal, no hepatojugular reflux, carotid upstroke brisk and symmetric, no bruit, no thyromegaly, no lymphadenopathy LUNGS:  good respiratory effort, crackels ,B bases CV:  PMI not displaced, no thrills, no lifts, S1 and S2 within normal limits, no palpable S3 or S4, no murmurs, no rubs, no gallops ABD:  Soft, nontender, nondistended, normoactive bowel sounds, no abdominal aortic  bruit MS: nontender back, no kyphosis, no scoliosis EXT:  2+ DP/PT pulses, +1 edema, no varicosities, no cyanosis, no clubbing SKIN: warm, nondiaphoretic, normal turgor, no ulcers NEUROPSYCH: alert, oriented to person, place, and time, sensory/motor grossly intact, normal mood, appropriate affect    Labs    Chemistry Recent Labs Lab 02/08/17 0834 03/09/2017 0705  02/09/2017 2125 02/13/17 0134 02/13/17 0531  NA 134* 128*  < > 131* 133* 132*  K 3.6 6.8*  < > 4.0 3.5 3.7  CL 105 102  < > 97* 99* 96*  CO2 15* 9*  < > 17* 21* 23  GLUCOSE 229* 315*  < > 102* 121* 121*  BUN 15 66*  < > 45* 40* 33*  CREATININE 1.61* 4.10*  < > 2.53* 2.22* 1.89*   CALCIUM 8.4* 7.1*  < > 6.8* 6.9* 7.2*  PROT 7.3 6.4*  --   --   --  5.9*  ALBUMIN 2.7* 2.2*  < > 1.9* 1.8* 2.0*  AST 79* 71*  --   --   --  99*  ALT 37 27  --   --   --  30  ALKPHOS 738* 392*  --   --   --  434*  BILITOT 0.7 0.7  --   --   --  0.9  GFRNONAA 33* 11*  < > 19* 22* 27*  GFRAA 38* 12*  < > 22* 26* 31*  ANIONGAP 14 17*  < > 17* 13 13  < > = values in this interval not displayed.   Hematology Recent Labs Lab 03/10/2017 1748 03/08/2017 2214 02/13/17 0531  WBC 13.5* 9.6 5.1  RBC 4.31 4.33 4.33  HGB 11.8* 11.7* 11.8*  HCT 34.6* 34.1* 33.8*  MCV 80.2 79.0* 78.2*  MCH 27.3 27.1 27.2  MCHC 34.0 34.3 34.8  RDW 20.5* 19.7* 20.3*  PLT 135* 121* 93*    Cardiac Enzymes Recent Labs Lab 02/22/2017 1204 02/17/2017 1920 02/13/17 0134 02/13/17 0759  TROPONINI 0.13* 0.07* 0.06* 0.06*   No results for input(s): TROPIPOC in the last 168 hours.   BNP Recent Labs Lab 03/09/2017 0705 02/13/17 0531  BNP 461.0* 260.0*     DDimer No results for input(s): DDIMER in the last 168 hours.   Radiology    Dg Chest Port 1 View  Result Date: 02/13/2017 CLINICAL DATA:  Respiratory failure. EXAM: PORTABLE CHEST 1 VIEW COMPARISON:  02/11/2017 FINDINGS: Right chest wall port a catheter tip is at the cavoatrial junction. Stable cardiac enlargement. Bilateral pleural effusions and mild interstitial edema identified. This appears slightly progressive when compared with previous exam. IMPRESSION: Mild increase in CHF. Electronically Signed   By: Kerby Moors M.D.   On: 02/13/2017 07:15   Dg Chest Port 1 View  Result Date: 03/09/2017 CLINICAL DATA:  Difficulty breathing. EXAM: PORTABLE CHEST 1 VIEW COMPARISON:  None. FINDINGS: Mild cardiomegaly is noted. No pneumothorax is noted. Right internal jugular Port-A-Cath is noted with distal tip in expected position of cavoatrial junction. Mild bibasilar subsegmental atelectasis is noted. Minimal left pleural effusion may be present. Bony thorax is  unremarkable. IMPRESSION: Mild bibasilar subsegmental atelectasis. Minimal left pleural effusion. Electronically Signed   By: Marijo Conception, M.D.   On: 02/20/2017 08:01    Cardiac Studies   Echo 03/03/2017: Procedure narrative: Transthoracic echocardiography. Image   quality was suboptimal. The study was technically difficult, as a   result of poor acoustic windows and poor sound wave transmission. - Left ventricle: Left ventricle measurements  were not available.   Technically difficult study. Systolic function appears low normal   to mildly reduced. The estimated ejection fraction was  50%.   Regional wall motion abnormalities cannot be excluded. The study   is not technically sufficient to allow evaluation of LV diastolic   function. - Pericardium, extracardiac: A pericardial effusion was identified.   It is moderate to large in size ( 9-25mm in some views),   circumferential. The available hemodynamic assessment is very   limited. MV inflow appear to be within normal limits. No   available TV inflow. Recommend serial evaluation.  Patient Profile     66 y.o. female Stage IV high grade neuroendocrine tumor, admitted with acute respiratory failure, acute renal failure, severe metabolic acidosis, acute anemia with ST elevation on EKG   Assessment & Plan    Abnormal EKG Note of inferior ST elevation on initial EKG.  Pericardial effusion  Due to significant medical comorbidities including acute renal failure, acute anemia, and stage IV cancer, risks of cardiac catheterization was deemed greater than potential benefit Anticoagulation was not started started due to acute anemia, thrombocytopenia Echocardiogram was technically difficult but lvef appears to be not significantly reduced. No gross wall motion abnormality noted, however we will request for limited echo in the morning with contrast study. Significant finding is pericardial effusion, moderate in amount with limited hemodynamic  assessment for tamponade.  Pericardial effusion differential diagnosis includes malignancy, renal failure. Check TSH, fT4 Continue management of underlying medical issues per primary team  Atrial fibrillation, rapid ventricular response Agree with amiodarone. Per report, HR does not significantly respond to IV Lopressor. May consider Cardizem drip for lenient heart rate control. Afib rvr  is likely being driven by multiple medical issues. As mentioned above, patient unable to tolerate anticoagulation due to significant anemia, thrombocytopenia  I spent at least 40 minutes with the patient today and more than 50% of the time was spent counseling the patient and coordinating care.    Signed, Wende Bushy, MD  02/13/2017, 11:10 AM

## 2017-02-13 NOTE — Progress Notes (Signed)
Pt. Remained on Bipap O/N weaned to 60 % FiO2. Alert to self, intermittently confused- husband remained bedside for comfort. Pt.'s remains in A-fib uncontrolled sustaining > 100 with high's in 130's on an amio gtt, given 2 doses of lopressor PRN- BP tolerating. Pt. Became anxious and agitated around 0130- ativan given x 1 which allowed pt. To rest.   Pt. UOP poor O/N 0-5 mL/hr. No issues with CRRT O/N.  Pt. c/o pain this AM MD ordered 75 mcg fentanyl, pt. Given 25 mcg- which relieved pain- held the rest.  Report given to [facility].

## 2017-02-13 NOTE — Progress Notes (Signed)
PULMONARY / CRITICAL CARE MEDICINE   Name: Sharon Hester MRN: [id number] DOB: Jul 28, 1951    ADMISSION DATE:  02/21/2017  PT PROFILE:   66 y.o. 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 Presented to ED via EMS with 3 days of progressive dyspnea, weakness, and altered mental status. On the morning of admission, she as in severe distress with profound lethargy. In the ED, she was profoundly hypotensive with multiple metabolic derangements and organ dysfunction. She received 3 units RBCs in ED for Hgb 7. Her EKG was consistent with STEMI but she was not deemed a candidate for LHC   MAJOR EVENTS/TEST RESULTS: 05/05 Admission as above. Extensive discussions re: goals of care. Full aggressive support including ACLS and intubation if needed desired by family 05/05 Received 3 units RBCs. Heparin infusion initiated for possible AMI. Vasopressors initiated. HD cath placed and CRRT initiated 05/05 Echocardiogram: LVEF 45-50%> mod-large pericardial effusion 05/05 AFRVR > amiodarone infusion initiated 05/06 Oncology consultation 05/06 Off vasopressors. Oligo-anuric. Remains on CRRT. CXR c/w edema - UF rate increased on CRRT. UA had TNTC WBC - culture not sent  INDWELLING DEVICES:: R sided port-a-cath 05/05 L femoral HD cath >>    MICRO DATA: MRSA PCR 05/05 >>  Blood 05/05 >>  Urine 05/05 >> TNTC WBCs on UA. Culture not sent  ANTIMICROBIALS:  Vanc 05/05 >>  Cefepime 05/05 >>   SUBJECTIVE:  Severe xerostomia but cognition much improved. Trying to talk. Writing messages. Off NE. Anuric  VITAL SIGNS: BP 118/88   Pulse (!) 128   Temp 99.5 F (37.5 C)   Resp 20   Ht 5\' 6"  (1.676 m)   Wt 138 lb (62.6 kg)   SpO2 90%   BMI 22.27 kg/m   HEMODYNAMICS:    VENTILATOR SETTINGS: FiO2 (%):  [60 %-80 %] 60 %  INTAKE / OUTPUT: I/O last 3 completed shifts: In: 5468.7 [I.V.:2093.7; Blood:740; Other:85; IV  Piggyback:2550] Out: 688 [Urine:688]  PHYSICAL EXAMINATION: General: RASS 0, conversant, chronically ill appearing, mildly dyspneic appearing on [place] Neuro: CNs intact, MAEs HEENT: alopecia, NCAT, sclera white Cardiovascular: IRIR, distant HS, no rubs or M noted Lungs: diffusely coarse with few scattered wheezes Abdomen: soft, NT, absent BS Ext: cool, pale, cyanotic nail beds, symmetric ankle edema Skin: pallor, no lesions notes  LABS:  BMET  Recent Labs Lab 02/13/17 0134 02/13/17 0531 02/13/17 1041  NA 133* 132* 131*  K 3.5 3.7 3.6  CL 99* 96* 94*  CO2 21* 23 21*  BUN 40* 33* 27*  CREATININE 2.22* 1.89* 1.75*  GLUCOSE 121* 121* 234*    Electrolytes  Recent Labs Lab 03/02/2017 2125 02/13/17 0134 02/13/17 0531 02/13/17 1040 02/13/17 1041  CALCIUM 6.8* 6.9* 7.2*  --  7.2*  MG 1.8 1.8 1.8 1.8  --   PHOS 4.6  4.7* 4.2  --   --  4.3    CBC  Recent Labs Lab 02/26/2017 1748 02/23/2017 2214 02/13/17 0531  WBC 13.5* 9.6 5.1  HGB 11.8* 11.7* 11.8*  HCT 34.6* 34.1* 33.8*  PLT 135* 121* 93*    Coag's  Recent Labs Lab 02/25/2017 0705 02/13/17 0531  APTT 33 34  INR 1.61  --     Sepsis Markers  Recent Labs Lab 03/01/2017 0749 03/02/2017 1040 02/13/17 0531  LATICACIDVEN 3.0* 2.7*  --   PROCALCITON  --   --  4.20    ABG  Recent Labs Lab 02/28/2017 0818  PHART 7.04*  PCO2ART 34  PO2ART 78*    Liver Enzymes  Recent Labs Lab 02/08/17 0834 02/20/2017 0705  02/13/17 0134 02/13/17 0531 02/13/17 1041  AST 79* 71*  --   --  99*  --   ALT 37 27  --   --  30  --   ALKPHOS 738* 392*  --   --  434*  --   BILITOT 0.7 0.7  --   --  0.9  --   ALBUMIN 2.7* 2.2*  < > 1.8* 2.0* 1.9*  < > = values in this interval not displayed.  Cardiac Enzymes  Recent Labs Lab 03/02/2017 1920 02/13/17 0134 02/13/17 0759  TROPONINI 0.07* 0.06* 0.06*    Glucose  Recent Labs Lab 02/22/2017 1604 03/02/2017 2022 02/13/17 0051 02/13/17 0424 02/13/17 0756 02/13/17 1117   GLUCAP 237* 119* 93 113* 116* 130*    CXR: increased edema pattern  EKG: AF, vent rate 126, diffuse ST segment elevation improved   ASSESSMENT / PLAN:  PULMONARY A: Acute hypoxemic respiratory failure Pulmonary edema Wheezing with remote history of asthma P:   Cont supplemental O2 Cont PRN NPPV Cont nebulized steroids, BDs Systemic steroids changed from [facility] to methylpred  CARDIOVASCULAR A:  Shock, presumed sepsis - resolved Abnormal EKG - possible pericarditis Minimally elevated trop I Mildly elevated BNP AF with RVR P:  Cont NE as needed to maintain MAP > 65 mmHg Cont amiodarone infusion Cont low dose PRN metoprolol to maintain HR < 115/min Cardiology following  RENAL A:   Anuric AKI  CKD Metabolic acidosis, resolved Hyperkalemia, resolved P:   Monitor BMET intermittently Monitor I/Os Correct electrolytes as indicated Cont CRRT per Nephrology DC HCO3 infusion  GASTROINTESTINAL A:   Mildly abnormal LFTs Liver metastases P:   SUP: IV PPI NPO for now  HEME/ONC A:   Severe anemia without overt bleeding Metastatic high grade neuroendocrine cancer Thrombocytopenia P:  DVT px: SCDs - Heparin DC'd 05/06 Monitor CBC intermittently Transfuse per usual guidelines Oncology following  INFECTIOUS A:   Severe sepsis Pyuria (unfortunately, it appears that U cx was not performed) P:   Monitor temp, WBC count Micro and abx as above   ENDOCRINE A:   Hyperglycemia without prior history of DM P:   Cont SSI - sens scale  NEUROLOGIC A:   Acute encephalopathy Anxiety  ICU associated dicomfort P:   RASS goal: 0 Low dose PRN lorazepam Low dose PRN fentanyl  FAMILY  - Updates: husband, daughter updated in detail and several times throughout the day   CCM time: 45 mins The above time includes time spent in consultation with patient and/or family members and reviewing care plan on multidisciplinary rounds  Merton Border, MD PCCM service Mobile  [phone] Pager [phone] 02/13/2017, 1:13 PM

## 2017-02-13 NOTE — Progress Notes (Signed)
[place] consulted to dose adjust medications for CRRT  Assessment/Plan: Antibiotics: Continue  cefepime 2 g IV q 12 hours Continue  vancomycin 1000 mg IV q 24 hours (trough ordered prior to 3rd dose with a goal of 15-25)  Allergies  Allergen Reactions  . Codeine Nausea And Vomiting    Patient Measurements: Height: 5\' 6"  (167.6 cm) Weight: 138 lb (62.6 kg) IBW/kg (Calculated) : 59.3  Vital Signs: Temp: 99 F (37.2 C) (05/06 1000) Temp Source: Core (Comment) (05/06 0800) BP: 107/86 (05/06 1000) Pulse Rate: 114 (05/06 1000) Intake/Output from previous day: 05/05 0701 - 05/06 0700 In: 5468.7 [I.V.:2093.7; Blood:740; IV Piggyback:2550] Out: 688 [Urine:688] Intake/Output from this shift: Total I/O In: 345.9 [I.V.:145.9; IV Piggyback:200] Out: 164 [Urine:40; Other:124]  Labs:  Recent Labs  02/09/2017 0705  02/09/2017 1748 02/09/2017 2125 02/25/2017 2214 02/13/17 0134 02/13/17 0531  WBC 25.8*  --  13.5*  --  9.6  --  5.1  HGB 7.0*  --  11.8*  --  11.7*  --  11.8*  HCT 21.8*  --  34.6*  --  34.1*  --  33.8*  PLT 234  --  135*  --  121*  --  93*  APTT 33  --   --   --   --   --  34  CREATININE 4.10*  < > 3.34* 2.53*  --  2.22* 1.89*  MG  --   < > 2.1 1.8  --  1.8 1.8  PHOS  --   < > 6.0* 4.6  4.7*  --  4.2  --   ALBUMIN 2.2*  < > 2.1* 1.9*  --  1.8* 2.0*  PROT 6.4*  --   --   --   --   --  5.9*  AST 71*  --   --   --   --   --  99*  ALT 27  --   --   --   --   --  30  ALKPHOS 392*  --   --   --   --   --  434*  BILITOT 0.7  --   --   --   --   --  0.9  < > = values in this interval not displayed. Estimated Creatinine Clearance: 27.8 mL/min (A) (by C-G formula based on SCr of 1.89 mg/dL (H)).   Microbiology: Recent Results (from the past 720 hour(s))  Culture, blood (routine x 2)     Status: None (Preliminary result)   Collection Time: 03/03/2017  7:05 AM  Result Value Ref Range Status   Specimen Description BLOOD LEFT NECK LINE  Final    Special Requests   Final    BOTTLES DRAWN AEROBIC AND ANAEROBIC Blood Culture results may not be optimal due to an excessive volume of blood received in culture bottles   Culture NO GROWTH <12 HOURS  Final   Report Status PENDING  Incomplete  Culture, blood (routine x 2)     Status: None (Preliminary result)   Collection Time: 03/05/2017  7:49 AM  Result Value Ref Range Status   Specimen Description BLOOD LEFT HAND  Final   Special Requests   Final    BOTTLES DRAWN AEROBIC AND ANAEROBIC Blood Culture results may not be optimal due to an excessive volume of blood received in culture bottles   Culture NO GROWTH <12 HOURS  Final   Report Status PENDING  Incomplete  MRSA PCR Screening  Status: None   Collection Time: 02/09/2017  3:34 PM  Result Value Ref Range Status   MRSA by PCR NEGATIVE NEGATIVE Final    Comment:        The GeneXpert MRSA Assay (FDA approved for NASAL specimens only), is one component of a comprehensive MRSA colonization surveillance program. It is not intended to diagnose MRSA infection nor to guide or monitor treatment for MRSA infections.     Medical History: Past Medical History:  Diagnosis Date  . Allergic rhinitis   . Anxiety   . Asthma   . Attention deficit disorder without mention of hyperactivity   . Calculus of kidney   . Cataract   . Diffuse cystic mastopathy   . High cholesterol   . History of kidney stones   . Hypoglycemia, unspecified   . Migraine, unspecified, without mention of intractable migraine without mention of status migrainosus   . Mixed hyperlipidemia   . Neuroendocrine tumor   . Osteopenia   . Precancerous lesion   . Unspecified essential hypertension     Larene Beach, PharmD  02/13/2017 10:47 AM

## 2017-02-13 NOTE — Progress Notes (Signed)
Notified MD Simonds that patient was experiencing signs of agitation and was not following commands. MD SImonds ordered Haldol.  Haldol administered, no change in agitation. Patient not following commands. MD Simonds to bedside.  See new orders. Will continue to monitor patient.

## 2017-02-13 NOTE — Progress Notes (Signed)
2130: Called e-link to report HR sustaining above 125 with high's in 150's. MD ordered 2nd amio bolus, will continue w/ gtt. - discussed that pt. Has some mottling of all toes and ball of R foot, pulses present and foot warm, will continue to monitor pt. Closely.

## 2017-02-13 NOTE — Progress Notes (Signed)
[facility]  ROUNDING NOTE   Subjective:   CRRT 2K bath. UF 0  UOP 688  Atrial fibrillation with RVR - placed on amio gtt Off norepinephrine  Sodium bicarbonate gtt  Platelets dropped to 93 - discontinued heparin gtt.   Objective:  Vital signs in last 24 hours:  Temp:  [95.5 F (35.3 C)-99.5 F (37.5 C)] 99.1 F (37.3 C) (05/06 0902) Pulse Rate:  [51-171] 124 (05/06 0902) Resp:  [12-32] 32 (05/06 0902) BP: (82-168)/(50-142) 99/81 (05/06 0902) SpO2:  [49 %-100 %] 95 % (05/06 0902) FiO2 (%):  [60 %-80 %] 60 % (05/06 0400)  Weight change:  Filed Weights   02/11/2017 0713  Weight: 62.6 kg (138 lb)    Intake/Output: I/O last 3 completed shifts: In: 5468.7 [I.V.:2093.7; Blood:740; Other:85; IV [id number]:5427] Out: 062 [Urine:688]   Intake/Output this shift:  Total I/O In: 329.2 [I.V.:129.2; IV Piggyback:200] Out: 27 [Other:27]  Physical Exam: General: Critically ill  Head: BIPAP  Eyes: Anicteric, PERRL  Neck: Supple, trachea midline  Lungs:  Bilateral crackles  Heart: Irregular, tachycardia  Abdomen:  Soft, nontender  Extremities: no peripheral edema.  Neurologic: Nonfocal, moving all four extremities  Skin: No lesions  Access: Right femoral temp HD catheter 5/5 Dr. Alva Garnet    Basic Metabolic Panel:  Recent Labs Lab 02/08/2017 1204 03/10/2017 1748 03/05/2017 2125 02/13/17 0134 02/13/17 0531  NA 129*  129* 130* 131* 133* 132*  K 6.5*  6.6* 4.9 4.0 3.5 3.7  CL 102  102 99* 97* 99* 96*  CO2 10*  11* 15* 17* 21* 23  GLUCOSE 334*  345* 222* 102* 121* 121*  BUN 64*  64* 60* 45* 40* 33*  CREATININE 3.79*  3.75* 3.34* 2.53* 2.22* 1.89*  CALCIUM 7.0*  7.0* 6.9* 6.8* 6.9* 7.2*  MG 2.4 2.1 1.8 1.8 1.8  PHOS 8.4* 6.0* 4.6  4.7* 4.2  --     Liver Function Tests:  Recent Labs Lab 02/08/17 0834 02/11/2017 0705 03/08/2017 1204 02/09/2017 1748 02/23/2017 2125 02/13/17 0134 02/13/17 0531  AST 79* 71*  --   --   --   --  99*  ALT 37 27  --   --    --   --  30  ALKPHOS 738* 392*  --   --   --   --  434*  BILITOT 0.7 0.7  --   --   --   --  0.9  PROT 7.3 6.4*  --   --   --   --  5.9*  ALBUMIN 2.7* 2.2* 2.1* 2.1* 1.9* 1.8* 2.0*   No results for input(s): LIPASE, AMYLASE in the last 168 hours. No results for input(s): AMMONIA in the last 168 hours.  CBC:  Recent Labs Lab 02/08/17 0834 02/16/2017 0705 02/08/2017 1748 02/18/2017 2214 02/13/17 0531  WBC 38.0* 25.8* 13.5* 9.6 5.1  NEUTROABS 35.5* 25.8*  --   --   --   HGB 8.9* 7.0* 11.8* 11.7* 11.8*  HCT 27.0* 21.8* 34.6* 34.1* 33.8*  MCV 88.6 90.3 80.2 79.0* 78.2*  PLT 358 234 135* 121* 93*    Cardiac Enzymes:  Recent Labs Lab 03/08/2017 0705 02/09/2017 1204 02/11/2017 1920 02/13/17 0134 02/13/17 0759  TROPONINI 0.07* 0.13* 0.07* 0.06* 0.06*    BNP: Invalid input(s): POCBNP  CBG:  Recent Labs Lab 02/15/2017 1604 02/25/2017 2022 02/13/17 0051 02/13/17 0424 02/13/17 0756  GLUCAP 237* 119* 93 113* 116*    Microbiology: Results for orders placed or performed during the  hospital encounter of 03/10/2017  Culture, blood (routine x 2)     Status: None (Preliminary result)   Collection Time: 03/10/2017  7:05 AM  Result Value Ref Range Status   Specimen Description BLOOD LEFT NECK LINE  Final   Special Requests   Final    BOTTLES DRAWN AEROBIC AND ANAEROBIC Blood Culture results may not be optimal due to an excessive volume of blood received in culture bottles   Culture NO GROWTH <12 HOURS  Final   Report Status PENDING  Incomplete  Culture, blood (routine x 2)     Status: None (Preliminary result)   Collection Time: 02/13/2017  7:49 AM  Result Value Ref Range Status   Specimen Description BLOOD LEFT HAND  Final   Special Requests   Final    BOTTLES DRAWN AEROBIC AND ANAEROBIC Blood Culture results may not be optimal due to an excessive volume of blood received in culture bottles   Culture NO GROWTH <12 HOURS  Final   Report Status PENDING  Incomplete  MRSA PCR Screening      Status: None   Collection Time: 02/09/2017  3:34 PM  Result Value Ref Range Status   MRSA by PCR NEGATIVE NEGATIVE Final    Comment:        The GeneXpert MRSA Assay (FDA approved for NASAL specimens only), is one component of a comprehensive MRSA colonization surveillance program. It is not intended to diagnose MRSA infection nor to guide or monitor treatment for MRSA infections.     Coagulation Studies:  Recent Labs  02/19/2017 0705  LABPROT 19.3*  INR 1.61    Urinalysis:  Recent Labs  02/11/2017 1056  COLORURINE YELLOW*  LABSPEC 1.011  PHURINE 7.0  GLUCOSEU >=500*  HGBUR SMALL*  BILIRUBINUR NEGATIVE  KETONESUR NEGATIVE  PROTEINUR 100*  NITRITE NEGATIVE  LEUKOCYTESUR LARGE*      Imaging: Dg Chest Port 1 View  Result Date: 02/13/2017 CLINICAL DATA:  Respiratory failure. EXAM: PORTABLE CHEST 1 VIEW COMPARISON:  02/18/2017 FINDINGS: Right chest wall port a catheter tip is at the cavoatrial junction. Stable cardiac enlargement. Bilateral pleural effusions and mild interstitial edema identified. This appears slightly progressive when compared with previous exam. IMPRESSION: Mild increase in CHF. Electronically Signed   By: Kerby Moors M.D.   On: 02/13/2017 07:15   Dg Chest Port 1 View  Result Date: 03/01/2017 CLINICAL DATA:  Difficulty breathing. EXAM: PORTABLE CHEST 1 VIEW COMPARISON:  None. FINDINGS: Mild cardiomegaly is noted. No pneumothorax is noted. Right internal jugular Port-A-Cath is noted with distal tip in expected position of cavoatrial junction. Mild bibasilar subsegmental atelectasis is noted. Minimal left pleural effusion may be present. Bony thorax is unremarkable. IMPRESSION: Mild bibasilar subsegmental atelectasis. Minimal left pleural effusion. Electronically Signed   By: Marijo Conception, M.D.   On: 02/19/2017 08:01     Medications:   . sodium chloride Stopped (03/06/2017 1253)  . sodium chloride Stopped (02/11/2017 1253)  . amiodarone 30 mg/hr (02/13/17  0700)  . ceFEPIme (MAXIPIME) 2 GM IVP Stopped (02/24/2017 2327)  . pureflow    . vancomycin 1,000 mg (02/13/17 0845)   . budesonide (PULMICORT) nebulizer solution  0.25 mg Nebulization Q6H  . chlorhexidine  15 mL Mouth Rinse BID  . heparin subcutaneous  5,000 Units Subcutaneous Q8H  . insulin aspart  0-9 Units Subcutaneous Q4H  . ipratropium-albuterol  3 mL Nebulization Q6H  . latanoprost  1 drop Right Eye QHS  . mouth rinse  15 mL Mouth Rinse  [id number]  . methylPREDNISolone (SOLU-MEDROL) injection  40 mg Intravenous Q12H  . pantoprazole (PROTONIX) IV  40 mg Intravenous Q24H  . sodium chloride flush  10-40 mL Intracatheter Q12H   acetaminophen **OR** acetaminophen, fentaNYL (SUBLIMAZE) injection, heparin, LORazepam, metoprolol, [DISCONTINUED] ondansetron **OR** ondansetron (ZOFRAN) IV, sodium chloride flush  Assessment/ Plan:  Sharon Hester is a 66 y.o. white female with stage IV neuroendocrine tumor, hypertension, COPD/asthma, osteopenia, hyperlipidemia, migraine headaches, cataracts, ADHD , who was admitted to [facility] on 03/03/2017 ]   1. Acute Renal Failure: baseline creatinine 1.02 4/3. Nonoliguric 2. Hyperkalemia 3. Hyponatremia 4. Metabolic Acidosis 5. Anemia: status post PRBC on admission 6. STEMI  7. Urinary tract infection/sepsis 8. Thrombocytopenia 9. Atrial Fibrillation  Plan:  Continue renal replacement therapy. CVVHD 2K bath. Increase Ultrafiltration to 123mL/hr.  Discontinue sodium bicarb gtt Off vasopressors Empiric antibiotics: cefepime and vanco Methylprednisolone  Discussed case with Dr. Alva Garnet and Dr. Mike Gip   LOS: 1 [place], [facility] 5/6/20189:09 AM

## 2017-02-13 NOTE — Plan of Care (Signed)
Problem: Skin Integrity: Goal: Risk for impaired skin integrity will decrease Outcome: Progressing Patient has been turned every two hours to decrease risk for skin impairment.

## 2017-02-13 NOTE — Progress Notes (Signed)
Called and spoke with Dr. Jimmy Footman r/t pt.'s HR sustaining > 115 with high's of 140's. - MD ordered lopressor PRN, and continue with gtt decrease as scheduled. Will continue to monitor pt. Closely.

## 2017-02-13 NOTE — Progress Notes (Unsigned)
Pt's husband sent me a text on Friday morning that his wife has been goin gon since early yesterday with coughing and wheezine, congestion, breathing somewhat labored. She use to have asthma and she is afraid that it is going into bronchitis. Will etoposide cause fluids to luild up. Any suggestion before weekend gets here. Does doc need to see her.  I called and spoke to husband and said I would call Dr. Janese Banks , she is not here today but she wants me to call her about something and I will ask.  Husband ok. I called rao and told her what text said and she wanted to know if she had rescue inhaler at home if not send  Albuterol inh. And then she is already on steroid so keep taking it and add zpak in case of bronchitis.  I called him and let him know above and he thanked me and I sent in rx electronically.  I checked on vp16 that she got the day before and less than 1 % chance of cough and nothing about fluid overload. I did advise his if she got worse throughout the day to weekend he is welcome to call on call md but would probably need to get her to ER if no better on meds above. He will do whatever she needs and he can text me if I can help in any way.

## 2017-02-13 NOTE — Progress Notes (Signed)
PHARMACY - PHYSICIAN COMMUNICATION CRITICAL VALUE ALERT - BLOOD CULTURE IDENTIFICATION (BCID)  Results for orders placed or performed during the hospital encounter of 02/17/2017  Blood Culture ID Panel (Reflexed) (Collected: 02/26/2017  7:49 AM)  Result Value Ref Range   Enterococcus species NOT DETECTED NOT DETECTED   Listeria monocytogenes NOT DETECTED NOT DETECTED   Staphylococcus species DETECTED (A) NOT DETECTED   Staphylococcus aureus DETECTED (A) NOT DETECTED   Methicillin resistance NOT DETECTED NOT DETECTED   Streptococcus species NOT DETECTED NOT DETECTED   Streptococcus agalactiae NOT DETECTED NOT DETECTED   Streptococcus pneumoniae NOT DETECTED NOT DETECTED   Streptococcus pyogenes NOT DETECTED NOT DETECTED   Acinetobacter baumannii NOT DETECTED NOT DETECTED   Enterobacteriaceae species NOT DETECTED NOT DETECTED   Enterobacter cloacae complex NOT DETECTED NOT DETECTED   Escherichia coli NOT DETECTED NOT DETECTED   Klebsiella oxytoca NOT DETECTED NOT DETECTED   Klebsiella pneumoniae NOT DETECTED NOT DETECTED   Proteus species NOT DETECTED NOT DETECTED   Serratia marcescens NOT DETECTED NOT DETECTED   Haemophilus influenzae NOT DETECTED NOT DETECTED   Neisseria meningitidis NOT DETECTED NOT DETECTED   Pseudomonas aeruginosa NOT DETECTED NOT DETECTED   Candida albicans NOT DETECTED NOT DETECTED   Candida glabrata NOT DETECTED NOT DETECTED   Candida krusei NOT DETECTED NOT DETECTED   Candida parapsilosis NOT DETECTED NOT DETECTED   Candida tropicalis NOT DETECTED NOT DETECTED    Name of physician (or Provider) Contacted: [place]   Changes to prescribed antibiotics required: No, will continue current abx, CCU MD can d/c vanc on 5/7 if he feels necessary.   Arsal Tappan D 02/13/2017  6:21 PM

## 2017-02-13 NOTE — Consult Note (Signed)
[facility]  Date of admission:  02/10/2017  Inpatient day:  02/13/2017  Consulting physician:  Dr Merton Border   Reason for Consultation:  Could this be tumor lysis syndrome?  Chief Complaint: Sharon Hester is a 66 y.o. female with stage IV high grade neuroendocrine tumor who was admitted through the emergency room with acute respiratory failure, acute renal failure, severe metabolic acidosis, acute anemia, hypotension and possible acute myocardial infarction.  HPI:  The patient was diagnosed with stage IV high grade neuroendocrine carcinoma on 10/07/2016.  PET scan on 10/01/2016 revealed widespread metastatic disease with hypermetabolism in the mediastinal and left hilar lymph nodes, multifocal pulmonary nodules, extensive liver metastases, upper abdominal and pelvic lymph node metastases, and widespread osseous metastatic disease, and at least one pathologic fracture involving the L2 vertebra.  She began cycle #1 carboplatin and etoposide on 10/18/2016.  After 2 cycles,  Chest, abomen and pelvic CT scans revealed a mixed response to therapy with significant interval progression of multifocal lytic bone metastases. There were multiple new compression fractures involving the thoracic and lumbar spine. There are bilateral femoral neck fractures which may be of impending orthopedic significance.  There was improvement in liver metastases and stable to improved appearance of pulmonary metastasis.  She was referred to radiation oncology and received palliative radiation from 12/09/2016 - 12/31/2016.  Decision was made to pursue 2 additional cycles of carboplatin and etoposide given her response in liver and lymph nodes.  She was seen by Dr. Janese Banks on 02/08/2017.  At that time, she was fatigued.  Performance status was declining.  Labs revealed a hematocrit of 27, hemoglobin 8.9, platelets 358,000, WBC 38,000.  Creatinine was 1.61.  She received cycle #4 carboplatin and etoposide  (05/01-05/03).  She did not receive Neulasta.  Plan was for repeat PET scan after recovery from cycle #4.    She developed a cough and shortness of breath on 02/09/2017.  She was started on azithromycin and albuterol inhaler on 02/11/2017.  She presented to the emergency room via EMS with a 3 day history of increased shortness of breath and decreased mentation.  She was hypotensive (84/47) with decreased O2 sats.  She was placed on a 100% non-rebreather.  She was placed on BiPAP.  EKG revealed ST segment elevation c/w STEMI.  Initial labs revealed acute renal failure with a BUN of 66, creatinine 4.1, potassium 6.8, and bicarb 9.  Phosphorus was 8.4.  Lactic acid was 3.0.  BNP was 461. Troponin 0.07 (peaked at 0.13).  CBC revealed a hematocrit of 21.8, hemoglobin 7.0, platelets 234,000, WBC 25,800 with an ANC of 25,800.  PT was 19.3 and PTT 31.  Urinalysis revealed TNTC WBCs.  Cultures are negative to date.  She received 3 units of PRBCs.  She was started on a bicarb drip.  She was started on heparin for possible AMI.  She was seen by nephrology.  A dialysis catheter was placed and she was started on CRRT.  She was started on broad spectrum antibiotics (vancomycin and Cefepime) for presumed sepsis.  She was placed on norepinephrine.  Follow-up labs on 03/02/2017 at 21:25 revealed a hematocrit of 34.1, hemoglobin 11.7, platelets 121,000, and WBC 9600.  Creatinine was 2.53, POTASSIUM 4.0, phosphorus 4.7, and uric acid 6.1.  Initial CXR  Revealed mild bibasilar subsegmental atelectasis with a minimal left pleural effusion.  Echo is pending.  She developed atrial fibrillation and RVR.  She was started on amiodarone drip.  Follow-up CXR this morning reveals  mild increase in CHF.  She is off norepinephrine and the bicarbonate drip.  She remains on CRRT.   Past Medical History:  Diagnosis Date  . Allergic rhinitis   . Anxiety   . Asthma   . Attention deficit disorder without mention of hyperactivity    . Calculus of kidney   . Cataract   . Diffuse cystic mastopathy   . High cholesterol   . History of kidney stones   . Hypoglycemia, unspecified   . Migraine, unspecified, without mention of intractable migraine without mention of status migrainosus   . Mixed hyperlipidemia   . Neuroendocrine tumor   . Osteopenia   . Precancerous lesion   . Unspecified essential hypertension     Past Surgical History:  Procedure Laterality Date  . ABDOMINAL HYSTERECTOMY     10/2002  . APPENDECTOMY    . COLONOSCOPY  5/06  . COLONOSCOPY WITH PROPOFOL N/A 05/12/2015   Procedure: COLONOSCOPY WITH PROPOFOL;  Surgeon: Manya Silvas, MD;  Location: [facility] ENDOSCOPY;  Service: Endoscopy;  Laterality: N/A;  . COLONOSCOPY WITH PROPOFOL N/A 09/27/2016   Procedure: COLONOSCOPY WITH PROPOFOL;  Surgeon: Manya Silvas, MD;  Location: [facility] ENDOSCOPY;  Service: Endoscopy;  Laterality: N/A;  . EYE SURGERY     multiple; s/p childhood trauma  . LITHOTRIPSY    . 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 PERIPHERAL VASCULAR CATHETERIZATION N/A 11/04/2016   Procedure: Glori Luis Cath Insertion;  Surgeon: Algernon Huxley, MD;  Location: [facility];  Service: Cardiovascular;  Laterality: N/A;  . TONSILLECTOMY      Family History  Problem Relation Age of Onset  . Diabetes Father   . Colon cancer Father   . Hypertension Father   . Heart attack Father   . Heart attack Mother   . Coronary artery disease Mother   . Pancreatic cancer Mother   . Hyperlipidemia Mother   . Other Brother     Congenital hyperspadias  . Other Brother     loss of kidney function in 1 kidney early in life  . Diabetes      Grandmother  . Coronary artery disease Maternal Grandmother   . Coronary artery disease      Maternal aunts and uncles  . Breast cancer Neg Hx   . Ovarian cancer Neg Hx   . Uterine cancer Neg Hx     Social History:  reports that she has never smoked. She has never used smokeless tobacco. She reports that  she drinks about 0.6 oz of alcohol per week . She reports that she does not use drugs.   The patient is accompanied by her 2 daughters and husband.  Allergies:  Allergies  Allergen Reactions  . Codeine Nausea And Vomiting    Medications Prior to Admission  Medication Sig Dispense Refill  . albuterol (PROVENTIL HFA;VENTOLIN HFA) 108 (90 Base) MCG/ACT inhaler Inhale 2 puffs into the lungs every 6 (six) hours as needed for wheezing or shortness of breath. 1 Inhaler 2  . amLODipine (NORVASC) 5 MG tablet Take 1 tablet (5 mg total) by mouth daily. 90 tablet 3  . azithromycin (ZITHROMAX Z-PAK) 250 MG tablet Take 2 the first day and then 1 daily until completed 6 each 0  . cetirizine (ZYRTEC) 10 MG tablet Take 10 mg by mouth daily.      . cholecalciferol (VITAMIN D) 1000 UNITS tablet Take 1,000 Units by mouth daily.      . Coenzyme Q10 (COQ10) 400  MG CAPS Take 400 mg by mouth daily.     . Flaxseed, Linseed, (FLAX SEED OIL) 1000 MG CAPS Take 1 capsule by mouth daily.    . fluconazole (DIFLUCAN) 100 MG tablet Take 1 tablet (100 mg total) by mouth daily. Take 2 tablets on day one and then 1 tablet daily x 3 weeks 23 tablet 0  . lidocaine (LIDODERM) 5 % Place 1 patch onto the skin daily. Remove & Discard patch within 12 hours of use each day 30 patch 0  . lidocaine-prilocaine (EMLA) cream Apply 1 application topically as needed. Apply to port 2 hours prior to appt 30 g 2  . LUMIGAN 0.01 % SOLN Place 1 drop into the right eye at bedtime.  0  . megestrol (MEGACE) 400 MG/10ML suspension Take 10 mLs (400 mg total) by mouth daily. 240 mL 0  . Multiple Vitamin (MULTIVITAMIN) tablet Take 1 tablet by mouth daily.      . naloxone (NARCAN) nasal spray 4 mg/0.1 mL Place 1 spray into the nose daily as needed (opioid overdose).    Marland Kitchen OLANZapine (ZYPREXA) 10 MG tablet Take 1 tablet (10 mg total) by mouth at bedtime. 30 tablet 1  . Omega-3 Fatty Acids (FISH OIL) 1200 MG CAPS Take 1,200 mg by mouth daily.     .  ondansetron (ZOFRAN) 4 MG tablet take 1 tablet by mouth every 6 hours if needed for nausea and vomiting  0  . ondansetron (ZOFRAN-ODT) 4 MG disintegrating tablet Take 1 tablet (4 mg total) by mouth every 8 (eight) hours as needed for nausea or vomiting. 20 tablet 0  . Oxycodone HCl 10 MG TABS Take 1 tablet (10 mg total) by mouth every 6 (six) hours as needed. 15 tablet 0  . predniSONE (DELTASONE) 5 MG tablet Take 1 tablet (5 mg total) by mouth daily with breakfast. 30 tablet 0  . Probiotic Product (PROBIOTIC DAILY PO) Take 1 capsule by mouth daily.    . prochlorperazine (COMPAZINE) 10 MG tablet take 1 tablet by mouth every 6 hours if needed for nausea and vomiting 30 tablet 0  . rosuvastatin (CRESTOR) 20 MG tablet Take one and one half tablets by mouth once daily for cholesterol (Patient taking differently: Take 30 mg by mouth daily. ) 135 tablet 3  . venlafaxine XR (EFFEXOR-XR) 75 MG 24 hr capsule take 1 capsule by mouth once daily 90 capsule 3  . cyclobenzaprine (FLEXERIL) 5 MG tablet Take 1 tablet (5 mg total) by mouth 3 (three) times daily as needed for muscle spasms. (Patient not taking: Reported on 02/01/2017) 30 tablet 0  . fluticasone (FLONASE) 50 MCG/ACT nasal spray Place 2 sprays into both nostrils daily. (Patient not taking: Reported on 02/09/2017) 48 g 3  . HYDROcodone-acetaminophen (NORCO/VICODIN) 5-325 MG tablet Take 1-2 tablets by mouth every 6 (six) hours as needed. (Patient not taking: Reported on 02/23/2017) 40 tablet 0  . nitrofurantoin, macrocrystal-monohydrate, (MACROBID) 100 MG capsule Take 1 capsule (100 mg total) by mouth 2 (two) times daily. (Patient not taking: Reported on 02/08/2017) 14 capsule 0  . nystatin (MYCOSTATIN) 100000 UNIT/ML suspension take 5 milliliters by mouth four times a day (Patient not taking: Reported on 02/08/2017) 120 mL 1    Review of Systems: Patient 's husband and daughters provide much of history.  Patient denies any current symptoms. GENERAL:  Fatigued.   Wants to take a nap.  No fevers or sweats. PERFORMANCE STATUS (ECOG): 3 (prior to admission) HEENT:  Change in speech.  No apparent visual changes, runny nose, sore throat, mouth sores or tenderness. Lungs:  Shortness of breath.  No sputum production or hemoptysis. Cardiac:  No chest pain, palpitations, orthopnea, or PND. GI:  No nausea, vomiting, diarrhea, constipation, melena or hematochezia. GU:  No urgency, frequency, dysuria, or hematuria. Musculoskeletal:  No back pain.  No joint pain.  No muscle tenderness. Extremities:  No pain or swelling. Skin:  No rashes or skin changes. Neuro:  Numbness under chin.  General weakness.  No headache or focal weakness. Endocrine:  No diabetes, thyroid issues, hot flashes or night sweats. Psych:  No mood changes, depression or anxiety.  Engaging with family members. Pain:  Denies pain. Review of systems:  All other systems reviewed and found to be negative.  Physical Exam:  Blood pressure 107/86, pulse (!) 114, temperature 99 F (37.2 C), resp. rate (!) 28, height 5' 6"  (1.676 m), weight 138 lb (62.6 kg), SpO2 (!) 82 %.  GENERAL:  Chronically ill appearing woman lying in the intensive care unit.  Respirations are slightly labored. MENTAL STATUS:  Alert and oriented to person. HEAD:  Alopecia.  Normocephalic, atraumatic, face symmetric, no Cushingoid features. EYES:  Blue eyes.  Prosthetic left eye.  Pupil equal round and reactive to light.  No conjunctivitis or scleral icterus. ENT:  Oropharynx clear without lesion.  Tongue normal. Mucous membranes dry.  RESPIRATORY:  Musical upper airway sounds with scattered wheezes and crackles at the bases. CARDIOVASCULAR:  Irregular rate and rhythm without murmur, rub or gallop. ABDOMEN:  Soft, non-tender, with active bowel sounds, and no appreciable hepatosplenomegaly.  No masses. SKIN:  No rashes, ulcers or lesions. EXTREMITIES: No edema, no skin discoloration or tenderness.  No palpable  cords. NEUROLOGICAL:  Speech difficult to understand.  Follows commands.  Moves all 4 extremities.  Bilateral grip strength symmetric. PSYCH:  Appropriate.   Results for orders placed or performed during the hospital encounter of 03/10/2017 (from the past 48 hour(s))  CBC with Differential/Platelet     Status: Abnormal   Collection Time: 02/11/2017  7:05 AM  Result Value Ref Range   WBC 25.8 (H) 3.6 - 11.0 K/uL   RBC 2.42 (L) 3.80 - 5.20 MIL/uL   Hemoglobin 7.0 (L) 12.0 - 16.0 g/dL   HCT 21.8 (L) 35.0 - 47.0 %   MCV 90.3 80.0 - 100.0 fL   MCH 28.9 26.0 - 34.0 pg   MCHC 32.0 32.0 - 36.0 g/dL   RDW 20.5 (H) 11.5 - 14.5 %   Platelets 234 150 - 440 K/uL   Neutrophils Relative % 100 %   Lymphocytes Relative 0 %   Monocytes Relative 0 %   Eosinophils Relative 0 %   Basophils Relative 0 %   Neutro Abs 25.8 (H) 1.4 - 6.5 K/uL   Lymphs Abs 0.0 (L) 1.0 - 3.6 K/uL   Monocytes Absolute 0.0 (L) 0.2 - 0.9 K/uL   Eosinophils Absolute 0.0 0 - 0.7 K/uL   Basophils Absolute 0.0 0 - 0.1 K/uL   Smear Review      PLATELET CLUMPS NOTED ON SMEAR, COUNT APPEARS ADEQUATE  Protime-INR     Status: Abnormal   Collection Time: 03/07/2017  7:05 AM  Result Value Ref Range   Prothrombin Time 19.3 (H) 11.4 - 15.2 seconds   INR 1.61   APTT     Status: None   Collection Time: 03/08/2017  7:05 AM  Result Value Ref Range   aPTT 33 24 - 36 seconds  Comprehensive  metabolic panel     Status: Abnormal   Collection Time: 02/20/2017  7:05 AM  Result Value Ref Range   Sodium 128 (L) 135 - 145 mmol/L   Potassium 6.8 (HH) 3.5 - 5.1 mmol/L    Comment: CRITICAL RESULT CALLED TO, READ BACK BY AND VERIFIED WITH BRANDY DAVIS ON 02/15/2017 AT 0742 BY [facility]    Chloride 102 101 - 111 mmol/L   CO2 9 (L) 22 - 32 mmol/L   Glucose, Bld 315 (H) 65 - 99 mg/dL   BUN 66 (H) 6 - 20 mg/dL   Creatinine, Ser 4.10 (H) 0.44 - 1.00 mg/dL   Calcium 7.1 (L) 8.9 - 10.3 mg/dL   Total Protein 6.4 (L) 6.5 - 8.1 g/dL   Albumin 2.2 (L) 3.5 - 5.0 g/dL    AST 71 (H) 15 - 41 U/L   ALT 27 14 - 54 U/L   Alkaline Phosphatase 392 (H) 38 - 126 U/L   Total Bilirubin 0.7 0.3 - 1.2 mg/dL   GFR calc non Af Amer 11 (L) >60 mL/min   GFR calc Af Amer 12 (L) >60 mL/min    Comment: (NOTE) The eGFR has been calculated using the CKD EPI equation. This calculation has not been validated in all clinical situations. eGFR's persistently <60 mL/min signify possible Chronic Kidney Disease.    Anion gap 17 (H) 5 - 15  Troponin I     Status: Abnormal   Collection Time: 03/09/2017  7:05 AM  Result Value Ref Range   Troponin I 0.07 (HH) <0.03 ng/mL    Comment: CRITICAL RESULT CALLED TO, READ BACK BY AND VERIFIED WITH BRANDY DAVIS ON 03/01/2017 AT 0742 BY [facility]   Lipid panel     Status: Abnormal   Collection Time: 02/24/2017  7:05 AM  Result Value Ref Range   Cholesterol 105 0 - 200 mg/dL   Triglycerides 211 (H) <150 mg/dL   HDL 20 (L) >40 mg/dL   Total CHOL/HDL Ratio 5.3 RATIO   VLDL 42 (H) 0 - 40 mg/dL   LDL Cholesterol 43 0 - 99 mg/dL    Comment:        Total Cholesterol/HDL:CHD Risk Coronary Heart Disease Risk Table                     Men   Women  1/2 Average Risk   3.4   3.3  Average Risk       5.0   4.4  2 X Average Risk   9.6   7.1  3 X Average Risk  23.4   11.0        Use the calculated Patient Ratio above and the CHD Risk Table to determine the patient's CHD Risk.        ATP III CLASSIFICATION (LDL):  <100     mg/dL   Optimal  100-129  mg/dL   Near or Above                    Optimal  130-159  mg/dL   Borderline  160-189  mg/dL   High  >190     mg/dL   Very High   Brain natriuretic peptide     Status: Abnormal   Collection Time: 02/20/2017  7:05 AM  Result Value Ref Range   B Natriuretic Peptide 461.0 (H) 0.0 - 100.0 pg/mL  Culture, blood (routine x 2)     Status: None (Preliminary result)   Collection Time: 02/22/2017  7:05 AM  Result Value Ref Range   Specimen Description BLOOD LEFT NECK LINE    Special Requests      BOTTLES DRAWN AEROBIC  AND ANAEROBIC Blood Culture results may not be optimal due to an excessive volume of blood received in culture bottles   Culture NO GROWTH <12 HOURS    Report Status PENDING   Culture, blood (routine x 2)     Status: None (Preliminary result)   Collection Time: 02/22/2017  7:49 AM  Result Value Ref Range   Specimen Description BLOOD LEFT HAND    Special Requests      BOTTLES DRAWN AEROBIC AND ANAEROBIC Blood Culture results may not be optimal due to an excessive volume of blood received in culture bottles   Culture NO GROWTH <12 HOURS    Report Status PENDING   Lactic acid, plasma     Status: Abnormal   Collection Time: 03/09/2017  7:49 AM  Result Value Ref Range   Lactic Acid, Venous 3.0 (HH) 0.5 - 1.9 mmol/L    Comment: CRITICAL RESULT CALLED TO, READ BACK BY AND VERIFIED WITH DONALD SWEENEY ON 02/26/2017 AT 0830 BY [facility]   Type and screen [place]     Status: None   Collection Time: 02/22/2017  7:49 AM  Result Value Ref Range   ABO/RH(D) AB POS    Antibody Screen NEG    Sample Expiration 02/15/2017    Unit Number [id number]    Blood Component Type RBC, LR IRR    Unit division 00    Status of Unit ISSUED,FINAL    Transfusion Status OK TO TRANSFUSE    Crossmatch Result Compatible    Unit Number [id number]    Blood Component Type RBC LR PHER1    Unit division 00    Status of Unit ISSUED,FINAL    Unit tag comment EMERGENCY RELEASE    Transfusion Status OK TO TRANSFUSE    Crossmatch Result COMPATIBLE    Unit Number [id number]    Blood Component Type RBC LR PHER2    Unit division 00    Status of Unit ISSUED,FINAL    Unit tag comment EMERGENCY RELEASE    Transfusion Status OK TO TRANSFUSE    Crossmatch Result COMPATIBLE   Prepare RBC     Status: None   Collection Time: 03/01/2017  8:00 AM  Result Value Ref Range   Order Confirmation ORDER PROCESSED BY BLOOD BANK   Blood gas, arterial     Status: Abnormal   Collection Time: 02/19/2017  8:18 AM  Result  Value Ref Range   FIO2 0.44    Delivery systems NASAL CANNULA    pH, Arterial 7.04 (LL) 7.350 - 7.450    Comment: CRITICAL RESULT CALLED TO, READ BACK BY AND VERIFIED WITH:  DR. Marcene Brawn @ 3532 ON 02/09/2017 BY [facility]    pCO2 arterial 34 32.0 - 48.0 mmHg   pO2, Arterial 78 (L) 83.0 - 108.0 mmHg   Bicarbonate 9.2 (L) 20.0 - 28.0 mmol/L   Acid-base deficit 20.5 (H) 0.0 - 2.0 mmol/L   O2 Saturation 87.4 %   Patient temperature 37.0    Collection site LEFT RADIAL    Sample type ARTERIAL DRAW    Allens test (pass/fail) PASS PASS  Lactic acid, plasma     Status: Abnormal   Collection Time: 03/02/2017 10:40 AM  Result Value Ref Range   Lactic Acid, Venous 2.7 (HH) 0.5 - 1.9 mmol/L    Comment: CRITICAL RESULT CALLED  TO, READ BACK BY AND VERIFIED WITH ALICIA GRANGER ON 01/15/41 AT 1139 BY [facility]   Urinalysis, Complete w Microscopic     Status: Abnormal   Collection Time: 02/25/2017 10:56 AM  Result Value Ref Range   Color, Urine YELLOW (A) YELLOW   APPearance CLOUDY (A) CLEAR   Specific Gravity, Urine 1.011 1.005 - 1.030   pH 7.0 5.0 - 8.0   Glucose, UA >=500 (A) NEGATIVE mg/dL   Hgb urine dipstick SMALL (A) NEGATIVE   Bilirubin Urine NEGATIVE NEGATIVE   Ketones, ur NEGATIVE NEGATIVE mg/dL   Protein, ur 100 (A) NEGATIVE mg/dL   Nitrite NEGATIVE NEGATIVE   Leukocytes, UA LARGE (A) NEGATIVE   RBC / HPF 6-30 0 - 5 RBC/hpf   WBC, UA TOO NUMEROUS TO COUNT 0 - 5 WBC/hpf   Bacteria, UA RARE (A) NONE SEEN   Squamous Epithelial / LPF 0-5 (A) NONE SEEN   WBC Clumps PRESENT    Mucous PRESENT    Non Squamous Epithelial 0-5 (A) NONE SEEN  Basic metabolic panel     Status: Abnormal   Collection Time: 03/02/2017 12:04 PM  Result Value Ref Range   Sodium 129 (L) 135 - 145 mmol/L   Potassium 6.6 (HH) 3.5 - 5.1 mmol/L    Comment: CRITICAL RESULT CALLED TO, READ BACK BY AND VERIFIED WITH  STACI COLLIE AT 1250 02/17/2017 SDR    Chloride 102 101 - 111 mmol/L   CO2 11 (L) 22 - 32 mmol/L   Glucose, Bld 345 (H) 65 - 99  mg/dL   BUN 64 (H) 6 - 20 mg/dL   Creatinine, Ser 3.75 (H) 0.44 - 1.00 mg/dL   Calcium 7.0 (L) 8.9 - 10.3 mg/dL   GFR calc non Af Amer 12 (L) >60 mL/min   GFR calc Af Amer 14 (L) >60 mL/min    Comment: (NOTE) The eGFR has been calculated using the CKD EPI equation. This calculation has not been validated in all clinical situations. eGFR's persistently <60 mL/min signify possible Chronic Kidney Disease.    Anion gap 16 (H) 5 - 15  Troponin I     Status: Abnormal   Collection Time: 02/27/2017 12:04 PM  Result Value Ref Range   Troponin I 0.13 (HH) <0.03 ng/mL    Comment: CRITICAL VALUE NOTED. VALUE IS CONSISTENT WITH PREVIOUSLY REPORTED/CALLED VALUE  SDR   Renal function     Status: Abnormal   Collection Time: 02/27/2017 12:04 PM  Result Value Ref Range   Sodium 129 (L) 135 - 145 mmol/L   Potassium 6.5 (HH) 3.5 - 5.1 mmol/L    Comment: CRITICAL RESULT CALLED TO, READ BACK BY AND VERIFIED WITH ADAM SCARBOROUGH ON 02/22/2017 AT 1351 BY [facility]    Chloride 102 101 - 111 mmol/L   CO2 10 (L) 22 - 32 mmol/L   Glucose, Bld 334 (H) 65 - 99 mg/dL   BUN 64 (H) 6 - 20 mg/dL   Creatinine, Ser 3.79 (H) 0.44 - 1.00 mg/dL   Calcium 7.0 (L) 8.9 - 10.3 mg/dL   Phosphorus 8.4 (H) 2.5 - 4.6 mg/dL   Albumin 2.1 (L) 3.5 - 5.0 g/dL   GFR calc non Af Amer 12 (L) >60 mL/min   GFR calc Af Amer 13 (L) >60 mL/min    Comment: (NOTE) The eGFR has been calculated using the CKD EPI equation. This calculation has not been validated in all clinical situations. eGFR's persistently <60 mL/min signify possible Chronic Kidney Disease.    Anion  gap 17 (H) 5 - 15  Magnesium     Status: None   Collection Time: 03/09/2017 12:04 PM  Result Value Ref Range   Magnesium 2.4 1.7 - 2.4 mg/dL  MRSA PCR Screening     Status: None   Collection Time: 02/24/2017  3:34 PM  Result Value Ref Range   MRSA by PCR NEGATIVE NEGATIVE    Comment:        The GeneXpert MRSA Assay (FDA approved for NASAL specimens only), is one component of  a comprehensive MRSA colonization surveillance program. It is not intended to diagnose MRSA infection nor to guide or monitor treatment for MRSA infections.   Glucose, capillary     Status: Abnormal   Collection Time: 02/27/2017  4:04 PM  Result Value Ref Range   Glucose-Capillary 237 (H) 65 - 99 mg/dL  CBC     Status: Abnormal   Collection Time: 02/16/2017  5:48 PM  Result Value Ref Range   WBC 13.5 (H) 3.6 - 11.0 K/uL   RBC 4.31 3.80 - 5.20 MIL/uL   Hemoglobin 11.8 (L) 12.0 - 16.0 g/dL   HCT 34.6 (L) 35.0 - 47.0 %   MCV 80.2 80.0 - 100.0 fL   MCH 27.3 26.0 - 34.0 pg   MCHC 34.0 32.0 - 36.0 g/dL   RDW 20.5 (H) 11.5 - 14.5 %   Platelets 135 (L) 150 - 440 K/uL  Renal function     Status: Abnormal   Collection Time: 03/03/2017  5:48 PM  Result Value Ref Range   Sodium 130 (L) 135 - 145 mmol/L   Potassium 4.9 3.5 - 5.1 mmol/L   Chloride 99 (L) 101 - 111 mmol/L   CO2 15 (L) 22 - 32 mmol/L   Glucose, Bld 222 (H) 65 - 99 mg/dL   BUN 60 (H) 6 - 20 mg/dL   Creatinine, Ser 3.34 (H) 0.44 - 1.00 mg/dL   Calcium 6.9 (L) 8.9 - 10.3 mg/dL   Phosphorus 6.0 (H) 2.5 - 4.6 mg/dL   Albumin 2.1 (L) 3.5 - 5.0 g/dL   GFR calc non Af Amer 13 (L) >60 mL/min   GFR calc Af Amer 16 (L) >60 mL/min    Comment: (NOTE) The eGFR has been calculated using the CKD EPI equation. This calculation has not been validated in all clinical situations. eGFR's persistently <60 mL/min signify possible Chronic Kidney Disease.    Anion gap 16 (H) 5 - 15  Magnesium     Status: None   Collection Time: 02/24/2017  5:48 PM  Result Value Ref Range   Magnesium 2.1 1.7 - 2.4 mg/dL  Troponin I (q 6hr x 3)     Status: Abnormal   Collection Time: 02/13/2017  7:20 PM  Result Value Ref Range   Troponin I 0.07 (HH) <0.03 ng/mL    Comment: CRITICAL VALUE NOTED. VALUE IS CONSISTENT WITH PREVIOUSLY REPORTED/CALLED VALUE.MSS  Glucose, capillary     Status: Abnormal   Collection Time: 02/11/2017  8:22 PM  Result Value Ref Range    Glucose-Capillary 119 (H) 65 - 99 mg/dL   Comment 1 Notify RN    Comment 2 Document in Chart   Renal function     Status: Abnormal   Collection Time: 02/26/2017  9:25 PM  Result Value Ref Range   Sodium 131 (L) 135 - 145 mmol/L   Potassium 4.0 3.5 - 5.1 mmol/L   Chloride 97 (L) 101 - 111 mmol/L   CO2 17 (L) 22 -  32 mmol/L   Glucose, Bld 102 (H) 65 - 99 mg/dL   BUN 45 (H) 6 - 20 mg/dL   Creatinine, Ser 2.53 (H) 0.44 - 1.00 mg/dL   Calcium 6.8 (L) 8.9 - 10.3 mg/dL   Phosphorus 4.7 (H) 2.5 - 4.6 mg/dL   Albumin 1.9 (L) 3.5 - 5.0 g/dL   GFR calc non Af Amer 19 (L) >60 mL/min   GFR calc Af Amer 22 (L) >60 mL/min    Comment: (NOTE) The eGFR has been calculated using the CKD EPI equation. This calculation has not been validated in all clinical situations. eGFR's persistently <60 mL/min signify possible Chronic Kidney Disease.    Anion gap 17 (H) 5 - 15  Magnesium     Status: None   Collection Time: 03/10/2017  9:25 PM  Result Value Ref Range   Magnesium 1.8 1.7 - 2.4 mg/dL  Uric acid     Status: None   Collection Time: 02/20/2017  9:25 PM  Result Value Ref Range   Uric Acid, Serum 6.1 2.3 - 6.6 mg/dL  Phosphorus     Status: None   Collection Time: 02/24/2017  9:25 PM  Result Value Ref Range   Phosphorus 4.6 2.5 - 4.6 mg/dL  Lactate dehydrogenase     Status: Abnormal   Collection Time: 02/21/2017  9:25 PM  Result Value Ref Range   LDH 890 (H) 98 - 192 U/L  CBC     Status: Abnormal   Collection Time: 02/15/2017 10:14 PM  Result Value Ref Range   WBC 9.6 3.6 - 11.0 K/uL   RBC 4.33 3.80 - 5.20 MIL/uL   Hemoglobin 11.7 (L) 12.0 - 16.0 g/dL   HCT 34.1 (L) 35.0 - 47.0 %   MCV 79.0 (L) 80.0 - 100.0 fL   MCH 27.1 26.0 - 34.0 pg   MCHC 34.3 32.0 - 36.0 g/dL   RDW 19.7 (H) 11.5 - 14.5 %   Platelets 121 (L) 150 - 440 K/uL  Glucose, capillary     Status: None   Collection Time: 02/13/17 12:51 AM  Result Value Ref Range   Glucose-Capillary 93 65 - 99 mg/dL  Renal function     Status: Abnormal    Collection Time: 02/13/17  1:34 AM  Result Value Ref Range   Sodium 133 (L) 135 - 145 mmol/L   Potassium 3.5 3.5 - 5.1 mmol/L   Chloride 99 (L) 101 - 111 mmol/L   CO2 21 (L) 22 - 32 mmol/L   Glucose, Bld 121 (H) 65 - 99 mg/dL   BUN 40 (H) 6 - 20 mg/dL   Creatinine, Ser 2.22 (H) 0.44 - 1.00 mg/dL   Calcium 6.9 (L) 8.9 - 10.3 mg/dL   Phosphorus 4.2 2.5 - 4.6 mg/dL   Albumin 1.8 (L) 3.5 - 5.0 g/dL   GFR calc non Af Amer 22 (L) >60 mL/min   GFR calc Af Amer 26 (L) >60 mL/min    Comment: (NOTE) The eGFR has been calculated using the CKD EPI equation. This calculation has not been validated in all clinical situations. eGFR's persistently <60 mL/min signify possible Chronic Kidney Disease.    Anion gap 13 5 - 15  Magnesium     Status: None   Collection Time: 02/13/17  1:34 AM  Result Value Ref Range   Magnesium 1.8 1.7 - 2.4 mg/dL  Troponin I (q 6hr x 3)     Status: Abnormal   Collection Time: 02/13/17  1:34 AM  Result Value  Ref Range   Troponin I 0.06 (HH) <0.03 ng/mL    Comment: CRITICAL VALUE NOTED. VALUE IS CONSISTENT WITH PREVIOUSLY REPORTED/CALLED VALUE.PMH  Glucose, capillary     Status: Abnormal   Collection Time: 02/13/17  4:24 AM  Result Value Ref Range   Glucose-Capillary 113 (H) 65 - 99 mg/dL  CBC     Status: Abnormal   Collection Time: 02/13/17  5:31 AM  Result Value Ref Range   WBC 5.1 3.6 - 11.0 K/uL   RBC 4.33 3.80 - 5.20 MIL/uL   Hemoglobin 11.8 (L) 12.0 - 16.0 g/dL   HCT 33.8 (L) 35.0 - 47.0 %   MCV 78.2 (L) 80.0 - 100.0 fL   MCH 27.2 26.0 - 34.0 pg   MCHC 34.8 32.0 - 36.0 g/dL   RDW 20.3 (H) 11.5 - 14.5 %   Platelets 93 (L) 150 - 440 K/uL  Comprehensive metabolic panel     Status: Abnormal   Collection Time: 02/13/17  5:31 AM  Result Value Ref Range   Sodium 132 (L) 135 - 145 mmol/L   Potassium 3.7 3.5 - 5.1 mmol/L   Chloride 96 (L) 101 - 111 mmol/L   CO2 23 22 - 32 mmol/L   Glucose, Bld 121 (H) 65 - 99 mg/dL   BUN 33 (H) 6 - 20 mg/dL    Creatinine, Ser 1.89 (H) 0.44 - 1.00 mg/dL   Calcium 7.2 (L) 8.9 - 10.3 mg/dL   Total Protein 5.9 (L) 6.5 - 8.1 g/dL   Albumin 2.0 (L) 3.5 - 5.0 g/dL   AST 99 (H) 15 - 41 U/L   ALT 30 14 - 54 U/L   Alkaline Phosphatase 434 (H) 38 - 126 U/L   Total Bilirubin 0.9 0.3 - 1.2 mg/dL   GFR calc non Af Amer 27 (L) >60 mL/min   GFR calc Af Amer 31 (L) >60 mL/min    Comment: (NOTE) The eGFR has been calculated using the CKD EPI equation. This calculation has not been validated in all clinical situations. eGFR's persistently <60 mL/min signify possible Chronic Kidney Disease.    Anion gap 13 5 - 15  Brain natriuretic peptide     Status: Abnormal   Collection Time: 02/13/17  5:31 AM  Result Value Ref Range   B Natriuretic Peptide 260.0 (H) 0.0 - 100.0 pg/mL  APTT     Status: None   Collection Time: 02/13/17  5:31 AM  Result Value Ref Range   aPTT 34 24 - 36 seconds  Magnesium     Status: None   Collection Time: 02/13/17  5:31 AM  Result Value Ref Range   Magnesium 1.8 1.7 - 2.4 mg/dL  Procalcitonin     Status: None   Collection Time: 02/13/17  5:31 AM  Result Value Ref Range   Procalcitonin 4.20 ng/mL    Comment:        Interpretation: PCT > 2 ng/mL: Systemic infection (sepsis) is likely, unless other causes are known. (NOTE)         ICU PCT Algorithm               Non ICU PCT Algorithm    ----------------------------     ------------------------------         PCT < 0.25 ng/mL                 PCT < 0.1 ng/mL     Stopping of antibiotics  Stopping of antibiotics       strongly encouraged.               strongly encouraged.    ----------------------------     ------------------------------       PCT level decrease by               PCT < 0.25 ng/mL       >= 80% from peak PCT       OR PCT 0.25 - 0.5 ng/mL          Stopping of antibiotics                                             encouraged.     Stopping of antibiotics           encouraged.    ----------------------------      ------------------------------       PCT level decrease by              PCT >= 0.25 ng/mL       < 80% from peak PCT        AND PCT >= 0.5 ng/mL            Continuing antibiotics                                               encouraged.       Continuing antibiotics            encouraged.    ----------------------------     ------------------------------     PCT level increase compared          PCT > 0.5 ng/mL         with peak PCT AND          PCT >= 0.5 ng/mL             Escalation of antibiotics                                          strongly encouraged.      Escalation of antibiotics        strongly encouraged.   Glucose, capillary     Status: Abnormal   Collection Time: 02/13/17  7:56 AM  Result Value Ref Range   Glucose-Capillary 116 (H) 65 - 99 mg/dL  Troponin I (q 6hr x 3)     Status: Abnormal   Collection Time: 02/13/17  7:59 AM  Result Value Ref Range   Troponin I 0.06 (HH) <0.03 ng/mL    Comment: CRITICAL VALUE NOTED. VALUE IS CONSISTENT WITH PREVIOUSLY REPORTED/CALLED VALUE  SDR    Dg Chest Port 1 View  Result Date: 02/13/2017 CLINICAL DATA:  Respiratory failure. EXAM: PORTABLE CHEST 1 VIEW COMPARISON:  03/03/2017 FINDINGS: Right chest wall port a catheter tip is at the cavoatrial junction. Stable cardiac enlargement. Bilateral pleural effusions and mild interstitial edema identified. This appears slightly progressive when compared with previous exam. IMPRESSION: Mild increase in CHF. Electronically Signed   By: Kerby Moors M.D.   On: 02/13/2017 07:15   Dg Chest [facility] 1 View  Result  Date: 02/09/2017 CLINICAL DATA:  Difficulty breathing. EXAM: PORTABLE CHEST 1 VIEW COMPARISON:  None. FINDINGS: Mild cardiomegaly is noted. No pneumothorax is noted. Right internal jugular Port-A-Cath is noted with distal tip in expected position of cavoatrial junction. Mild bibasilar subsegmental atelectasis is noted. Minimal left pleural effusion may be present. Bony thorax is unremarkable.  IMPRESSION: Mild bibasilar subsegmental atelectasis. Minimal left pleural effusion. Electronically Signed   By: Marijo Conception, M.D.   On: 02/10/2017 08:01    Assessment:  The patient is a 66 y.o.  woman with stage IV high grade neuroendocrine tumor currently day 6 s/p cycle #4 carboplatin and etoposide.  She presented with a 3 day history of worsening shortness of breath and altered mental status.  She presented with hypotension, acute renal failure, and multiple electrolyte abnormalities (acidosis, hyperkalemia, hyperphosphatemia) c/w sepsis.  Cultures are negative to date.  Initial urine with clumps of WBCs.  No current evidence of tumor lysis syndrome.  Uric acid is normal.  Plan:   1.  Oncology:  Patient with widely metastatic high grade neuroendocrine tumor s/p 4 cycles of carboplatin and etoposide.  After 2 cycles, she had a mixed response with disease responsive in liver and lymph nodes, but progressive in bones.    Discuss clinical situation with patient's husband.  Prognosis is poor.  Prior to last cycle, her performance status had declined.  Last scan revealed progressive bone based disease.  She is scheduled for follow-up scan after recovery from this cycle.  Discussed no meaningful options for second line therapy with progression on first line carboplatin and etoposide.  Discuss concern for CNS disease based on underlying disease and recent chin numbness.  Speech garbled.  Discuss plans for CNS imaging once she is stable and out of the ICU.  Discuss concern for escalating therapy (chest compressions/CPR) if needed secondary to multiple lytic lesions in bones.  Agree with continuation of current support.  Discuss need for family/patient conference to further address code status.  Patient is a candidate for Hospice.  Patient's primary oncologist has previously discussed this with the patient and her husband.  Consider palliative care consult.  2.  Hematology:  Patient s/p 3 units of PRBCs  on admission.  Counts adequate.  Platelet count drifting down secondary to consumption and recent chemotherapy.  3.  Cardiology:  Patient stable on amiodarone drip.  Off norepinephrine.  Echo pending.  4.  Pulmonary:  CXR reveals mild CHF.  Fluid being removed with CRRT.  5.  Nephrology:  Patient remains on CRRT.  No longer on bicarb drip.  Electrolytes improved.  6.  Infectious disease:  Clinical situation c/w sepsis.  Urine with TNTC WBCs.  Cultures negative to date.  Broad spectrum antibiotics (vancomycin and Cefepime) continue.   Thank you for allowing me to participate in Sharon Hester 's care.  I will follow her closely with you while hospitalized until Dr. Elroy Channel return on Monday, 16-Mar-2017.   Lequita Asal, MD  02/13/2017, 11:00 AM

## 2017-02-14 ENCOUNTER — Inpatient Hospital Stay: Payer: Medicare Other

## 2017-02-14 DIAGNOSIS — J9601 Acute respiratory failure with hypoxia: Secondary | ICD-10-CM

## 2017-02-14 LAB — GLUCOSE, CAPILLARY
GLUCOSE-CAPILLARY: 317 mg/dL — AB (ref 65–99)
Glucose-Capillary: 120 mg/dL — ABNORMAL HIGH (ref 65–99)
Glucose-Capillary: 132 mg/dL — ABNORMAL HIGH (ref 65–99)

## 2017-02-14 LAB — RENAL FUNCTION PANEL
ALBUMIN: 1.9 g/dL — AB (ref 3.5–5.0)
Anion gap: 14 (ref 5–15)
BUN: 27 mg/dL — AB (ref 6–20)
CHLORIDE: 99 mmol/L — AB (ref 101–111)
CO2: 21 mmol/L — AB (ref 22–32)
Calcium: 7.4 mg/dL — ABNORMAL LOW (ref 8.9–10.3)
Creatinine, Ser: 1.68 mg/dL — ABNORMAL HIGH (ref 0.44–1.00)
GFR calc Af Amer: 36 mL/min — ABNORMAL LOW (ref >60)
GFR, EST NON AFRICAN AMERICAN: 31 mL/min — AB (ref >60)
GLUCOSE: 124 mg/dL — AB (ref 65–99)
PHOSPHORUS: 3.6 mg/dL (ref 2.5–4.6)
POTASSIUM: 3.4 mmol/L — AB (ref 3.5–5.1)
Sodium: 134 mmol/L — ABNORMAL LOW (ref 135–145)

## 2017-02-14 LAB — MAGNESIUM: Magnesium: 1.9 mg/dL (ref 1.7–2.4)

## 2017-02-14 MED ORDER — LORAZEPAM 2 MG/ML IJ SOLN: 1.0000 mg | INTRAMUSCULAR | Status: DC | PRN

## 2017-02-14 MED ORDER — LIDOCAINE 5 % EX PTCH
1.0000 | MEDICATED_PATCH | CUTANEOUS | Status: DC
  Administered 2017-02-14: 1 via TRANSDERMAL
  Filled 2017-02-14: qty 1

## 2017-02-14 MED ORDER — MORPHINE SULFATE (PF) 4 MG/ML IV SOLN
5.0000 mg | INTRAVENOUS | Status: DC | PRN
  Administered 2017-02-14: 5 mg via INTRAVENOUS
  Filled 2017-02-14: qty 2

## 2017-02-14 MED ORDER — MORPHINE 100MG IN NS 100ML (1MG/ML) PREMIX INFUSION
1.0000 mg/h | INTRAVENOUS | Status: DC
  Administered 2017-02-14: 1 mg/h via INTRAVENOUS
  Administered 2017-02-14: 20 mg/h via INTRAVENOUS
  Filled 2017-02-14 (×3): qty 100

## 2017-02-14 MED ORDER — PUREFLOW DIALYSIS SOLUTION
INTRAVENOUS | Status: DC
  Administered 2017-02-14: 03:00:00 via INTRAVENOUS_CENTRAL

## 2017-02-14 MED ORDER — MORPHINE SULFATE 25 MG/ML IV SOLN
1.0000 mg/h | INTRAVENOUS | Status: DC
  Filled 2017-02-14: qty 10

## 2017-02-14 MED ORDER — MIDAZOLAM HCL 2 MG/2ML IJ SOLN
2.0000 mg | Freq: Once | INTRAMUSCULAR | Status: AC
  Administered 2017-02-14: 2 mg via INTRAVENOUS
  Filled 2017-02-14: qty 2

## 2017-02-14 MED ORDER — GLYCOPYRROLATE 0.2 MG/ML IJ SOLN: 0.1000 mg | Freq: Once | INTRAMUSCULAR | Status: DC

## 2017-02-15 ENCOUNTER — Ambulatory Visit: Admission: RE | Admit: 2017-02-15 | Payer: Medicare Other | Source: Ambulatory Visit

## 2017-02-16 LAB — CULTURE, BLOOD (ROUTINE X 2)

## 2017-02-17 ENCOUNTER — Telehealth: Payer: Self-pay | Admitting: *Deleted

## 2017-02-17 NOTE — Telephone Encounter (Signed)
Informed [place] death cert ready for pick up. Placed up front for pick up. Nothing further needed.

## 2017-02-17 NOTE — Telephone Encounter (Signed)
[place] Shoe Dropped off Death Certificate. Certificate is in [place]. Box. Thank you

## 2017-02-17 NOTE — Telephone Encounter (Signed)
Death certificate placed in provider box for signature.

## 2017-02-19 ENCOUNTER — Other Ambulatory Visit: Payer: Self-pay | Admitting: Nurse Practitioner

## 2017-02-21 IMAGING — CT CT ABD-PELV W/ CM
2 of 3 series · 14 of 31 positions shown, 17 images · IV contrast (iopamidol)
Comparison: [DATE]

CLINICAL DATA: Neuroendocrine cancer.  Status post chemotherapy.

EXAM:
CT CHEST, ABDOMEN, AND PELVIS WITH CONTRAST
TECHNIQUE: Multidetector CT imaging of the chest, abdomen and pelvis was
performed following the standard protocol during bolus
administration of intravenous contrast.
CONTRAST:  100mL [id number] IOPAMIDOL ([id number]) INJECTION 61%

[Series 2: cap with · axial · 0.71mm/px · z∈[-983,-458]mm · 10 of 133 slices shown, 13 images]
[im 14/133  mediastinal]
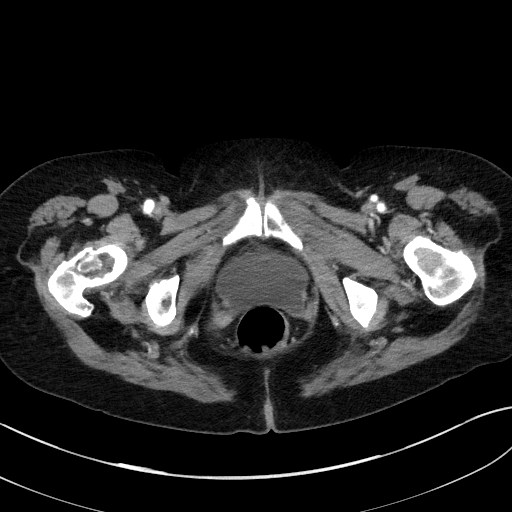
[im 14/133  lung]
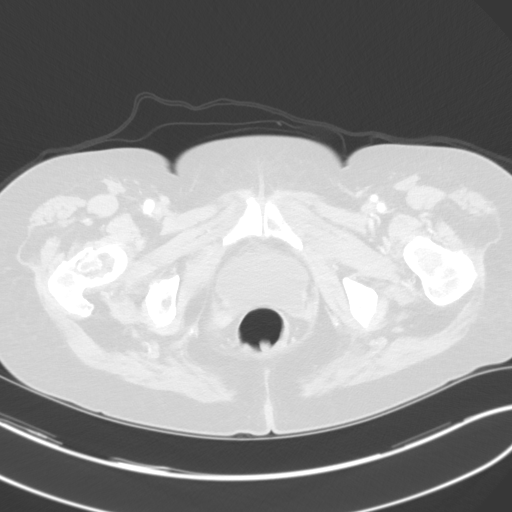
[im 27/133  lung]
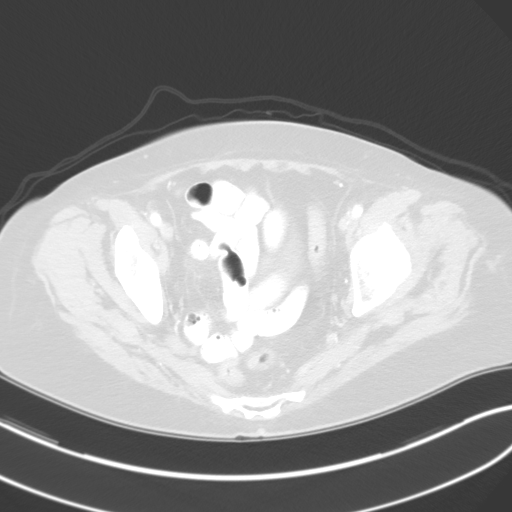
[im 40/133  lung]
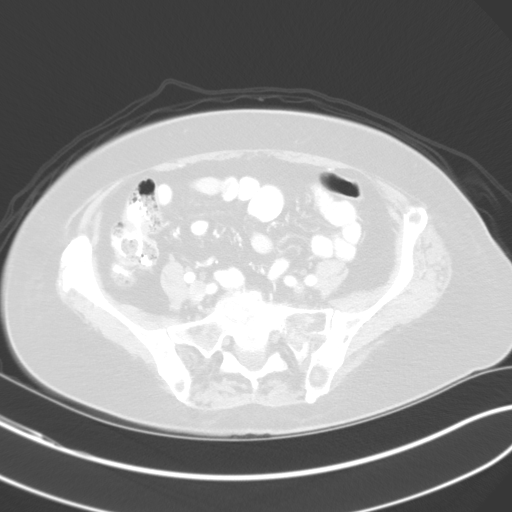
[im 53/133  lung]
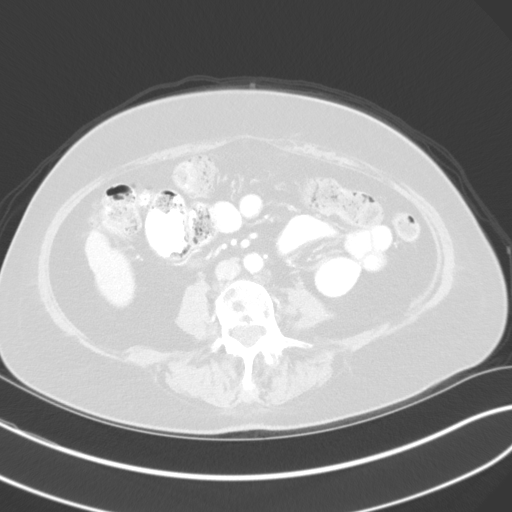
[im 65/133  mediastinal]
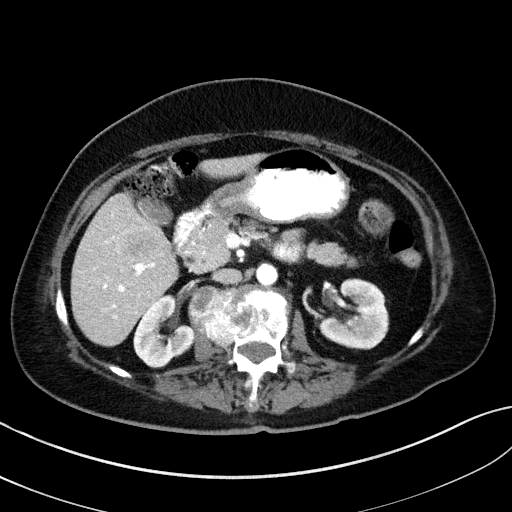
[im 65/133  lung]
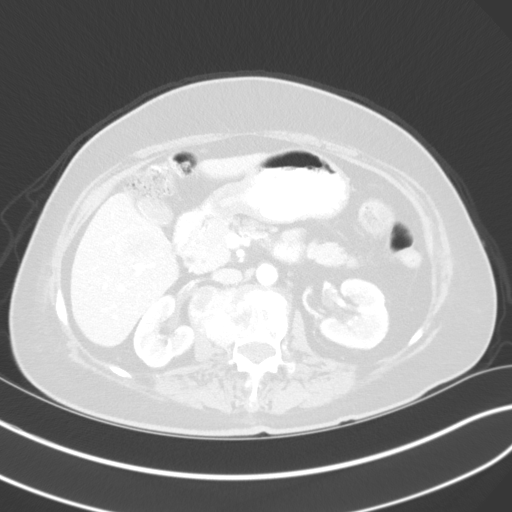
[im 67/133  lung]
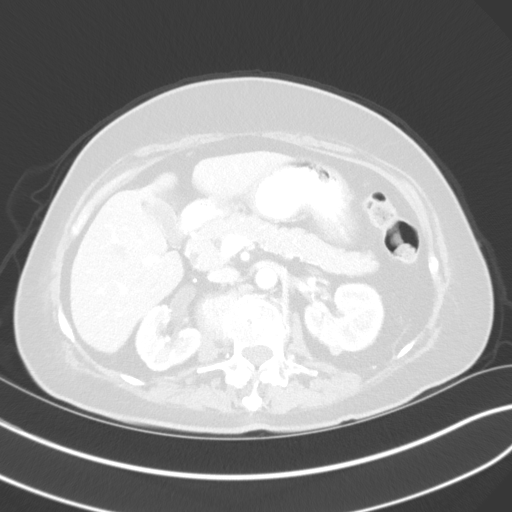
[im 80/133  lung]
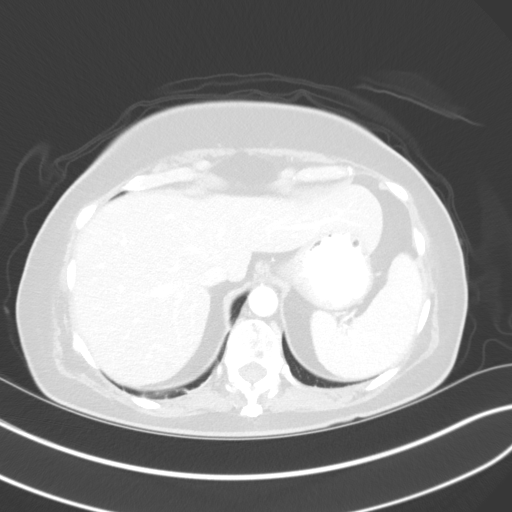
[im 93/133  lung]
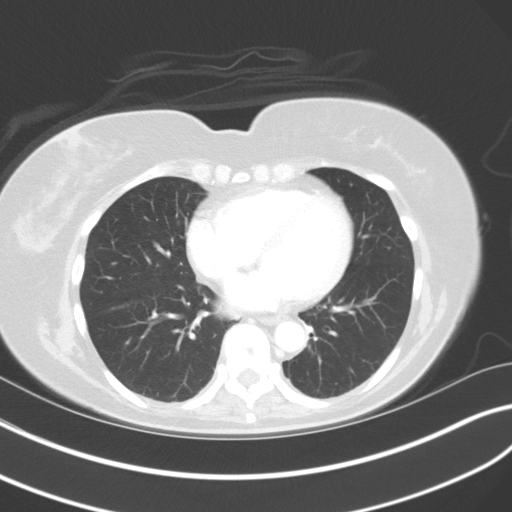
[im 106/133  mediastinal]
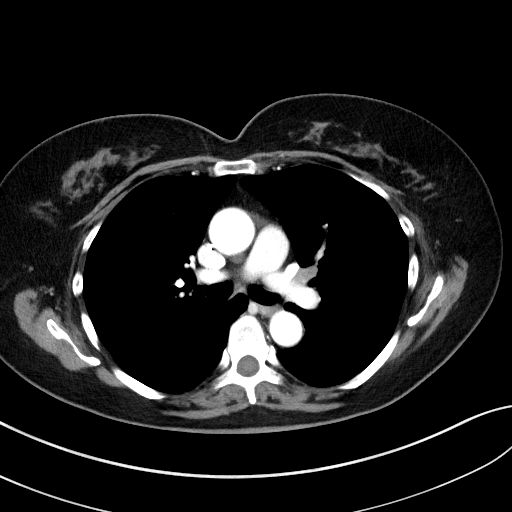
[im 106/133  lung]
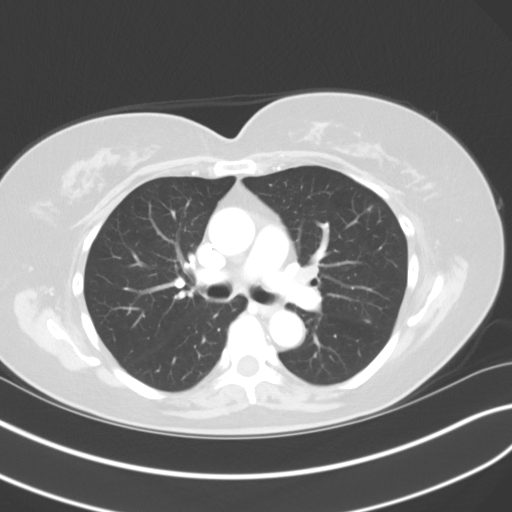
[im 119/133  lung]
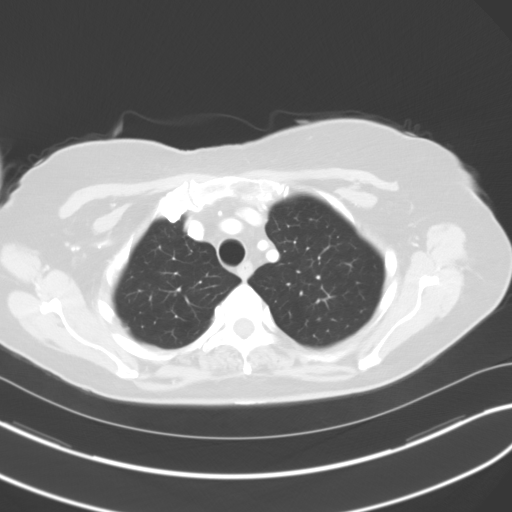

[Series 4: lung · axial · 0.71mm/px · z∈[-688,-564]mm · 4 of 163 slices shown]
[im 13/163  lung]
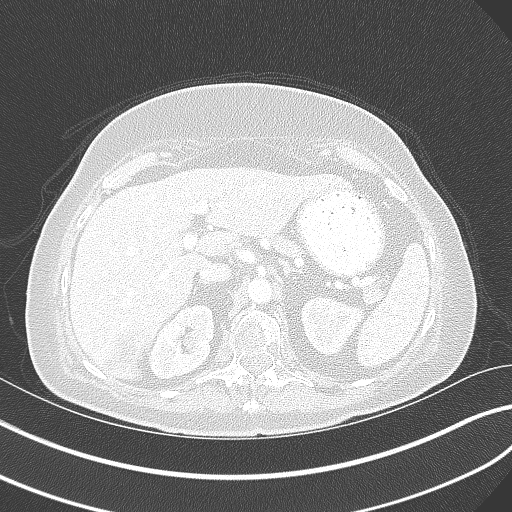
[im 38/163  lung]
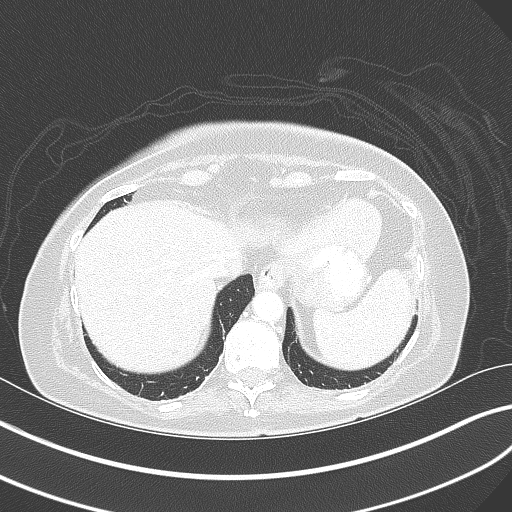
[im 63/163  lung]
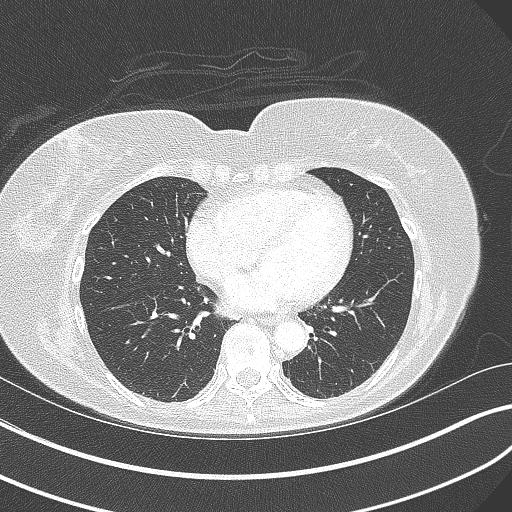
[im 75/163  lung]
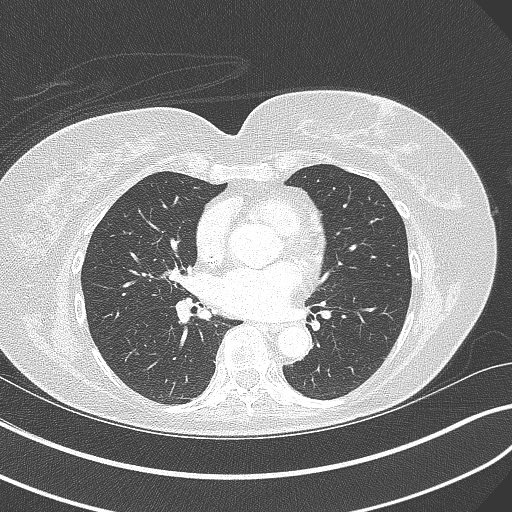

[14 of 31 positions shown; findings below may reference images not displayed]

FINDINGS: CT CHEST FINDINGS

Cardiovascular: The heart size appears normal. There is no
pericardial effusion identified.

Mediastinum/Nodes: No enlarged mediastinal, hilar, or axillary lymph
nodes. Index AP window lymph node measures 8 mm, image 24 of series
2. Previously 9 mm. Thyroid gland, trachea, and esophagus
demonstrate no significant findings.

Lungs/Pleura: No pleural fluid. No airspace opacities identified.
Scattered pulmonary nodules are again identified. 2 mm subpleural
nodule within the posterior left lower lobe is identified, image 103
of series 4. Index nodule within the left upper lobe measures 5 mm,
image 73 of series 4. Previously 8 mm. Perifissural nodule within
the lingula measures 3 mm and is unchanged from previous exam. There
is a nodule in the right lower lobe which is stable measuring 4 mm,
image 111 of series 4.

Musculoskeletal: Multifocal lytic bone metastases are identified.
This demonstrates significant interval progression when compared
with previous exam. New pathologic fractures involves T8 and T9.

CT ABDOMEN PELVIS FINDINGS

Hepatobiliary: The index lesion within posterior right lobe of liver
measures 3.6 cm, image 60 of series 2. This is decreased from 5.7 cm
previously. Index lesion within the anterior right lobe of liver
measures 4.9 x 2.7 cm, image 59 of series 2. Previously 5.2 x
cm. Index lesion within segment 5 of the right lobe measures 3.3 cm,
image 70 of series 2. Previously 3.5 cm.

Pancreas: Unremarkable. No pancreatic ductal dilatation or
surrounding inflammatory changes.

Spleen: Normal in size without focal abnormality.

Adrenals/Urinary Tract: The adrenal glands are normal. Bilateral
renal calculi are identified. No hydronephrosis or mass. The urinary
bladder appears normal.

Stomach/Bowel: Stomach is within normal limits. No evidence of bowel
wall thickening, distention, or inflammatory changes.

Vascular/Lymphatic: No significant vascular findings are present. No
enlarged abdominal or pelvic lymph nodes.

Reproductive: Status post hysterectomy. No adnexal masses.

Other: No abdominal wall hernia or abnormality. No abdominopelvic
ascites.

Musculoskeletal: Interval progression of multifocal lytic bone
metastases. There are progressive lytic lesions involving bilateral
femoral necks which may be of orthopedic significance, image number
116 of series 2 and image 119 of series 2. New pathologic fractures
are noted involving lumbar vertebra including and at the L2 level.
L3 inferior endplate fracture is noted and there are also fractures
involving L5.
IMPRESSION: 1. Interval mixed response to therapy.
2. Significant interval progression of multifocal lytic bone
metastases. There are multiple new compression fractures involving
the thoracic and lumbar spine. Additionally, there are bilateral
femoral neck fractures which may be of impending orthopedic
significance.
3. Improvement and liver metastases.
4. Stable to improved appearance of pulmonary metastasis.
These results will be called to the ordering clinician or
representative by the Radiologist Assistant, and communication
documented in the PACS or zVision Dashboard.

## 2017-02-22 ENCOUNTER — Ambulatory Visit: Payer: Medicare Other | Admitting: Oncology

## 2017-02-22 ENCOUNTER — Other Ambulatory Visit: Payer: Medicare Other

## 2017-03-02 ENCOUNTER — Ambulatory Visit: Payer: Medicare Other | Admitting: Radiation Oncology

## 2017-03-11 NOTE — Progress Notes (Signed)
PULMONARY / CRITICAL CARE MEDICINE   Name: Sharon Hester MRN: [id number] DOB: 24-Jun-1951    ADMISSION DATE:  02/09/2017  PT PROFILE:   66 y.o. 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 Presented to ED via EMS with 3 days of progressive dyspnea, weakness, and altered mental status. On the morning of admission, she as in severe distress with profound lethargy. In the ED, she was profoundly hypotensive with multiple metabolic derangements and organ dysfunction. She received 3 units RBCs in ED for Hgb 7. Her EKG was consistent with STEMI but she was not deemed a candidate for LHC   MAJOR EVENTS/TEST RESULTS: 05/05 Admission as above. Extensive discussions re: goals of care. Full aggressive support including ACLS and intubation if needed desired by family 05/05 Received 3 units RBCs. Heparin infusion initiated for possible AMI. Vasopressors initiated. HD cath placed and CRRT initiated 05/05 Echocardiogram: LVEF 45-50%> mod-large pericardial effusion 05/05 AFRVR > amiodarone infusion initiated 05/06 Oncology consultation 05/06 Off vasopressors. Oligo-anuric. Remains on CRRT. CXR c/w edema - UF rate increased on CRRT. UA had TNTC WBC - culture not sent 05/07 Patient made comfort care by family  INDWELLING DEVICES:: R sided port-a-cath 05/05 L femoral HD cath >>    MICRO DATA: MRSA PCR 05/05 >>  Blood 05/05 >>  Urine 05/05 >> TNTC WBCs on UA. Culture not sent  ANTIMICROBIALS:  Vanc 05/05 >>  Cefepime 05/05 >>   SUBJECTIVE:  Remained on CRRT all night. Minimal urine out put. Intermittent agitation with apneic episodes that got longer and longer. RR dropped to 8-10 breaths per minute. Approached family about intubation. All daughters and husband present at bedside. After consultation, they agreed to make patient DNR/DNI comfort care at 0500 am. Comfort care order witness by patient's primary  nurse-Britton-Lee  VITAL SIGNS: BP (!) 104/93   Pulse (!) 123   Temp 99 F (37.2 C)   Resp 20   Ht 5\' 6"  (1.676 m)   Wt 138 lb (62.6 kg)   SpO2 90%   BMI 22.27 kg/m   HEMODYNAMICS:    VENTILATOR SETTINGS: FiO2 (%):  [60 %-100 %] 100 %  INTAKE / OUTPUT: I/O last 3 completed shifts: In: 5964.9 [I.V.:2389.9; Blood:740; Other:85; IV XQJJHERDE:0814] Out: 4818 [Urine:758; Other:1011]  PHYSICAL EXAMINATION: General: chronically ill appearing, dyspneic appearing on March ARB with intermittent agitation Neuro: CNs intact, MAEs HEENT: alopecia, NCAT, sclera white Cardiovascular: IRIR, distant HS, no rubs or M noted Lungs: diffusely coarse with few scattered wheezes Abdomen: soft, NT, absent BS Ext: cool, pale, cyanotic nail beds, symmetric ankle edema Skin: pallor, no lesions noted  LABS:  BMET  Recent Labs Lab 02/13/17 1532 02/13/17 1940 02/13/17 2350  NA 133* 133* 134*  K 4.0 3.8 3.4*  CL 97* 96* 99*  CO2 22 21* 21*  BUN 28* 31* 27*  CREATININE 1.86* 2.07* 1.68*  GLUCOSE 131* 147* 124*    Electrolytes  Recent Labs Lab 02/13/17 1040  02/13/17 1532 02/13/17 1940 02/13/17 2350  CALCIUM  --   < > 7.3* 7.2* 7.4*  MG 1.8  --   --  1.8 1.9  PHOS  --   < > 5.3* 4.8* 3.6  < > = values in this interval not displayed.  CBC  Recent Labs Lab 02/28/2017 1748 02/28/2017 2214 02/13/17 0531  WBC 13.5* 9.6 5.1  HGB 11.8* 11.7* 11.8*  HCT 34.6* 34.1* 33.8*  PLT 135* 121* 93*  Coag's  Recent Labs Lab 02/21/2017 0705 02/13/17 0531  APTT 33 34  INR 1.61  --     Sepsis Markers  Recent Labs Lab 03/08/2017 0749 02/13/2017 1040 02/13/17 0531  LATICACIDVEN 3.0* 2.7*  --   PROCALCITON  --   --  4.20    ABG  Recent Labs Lab 03/09/2017 0818  PHART 7.04*  PCO2ART 34  PO2ART 78*    Liver Enzymes  Recent Labs Lab 02/08/17 0834 03/02/2017 0705  02/13/17 0531  02/13/17 1532 02/13/17 1940 02/13/17 2350  AST 79* 71*  --  99*  --   --   --   --   ALT 37 27  --   30  --   --   --   --   ALKPHOS 738* 392*  --  434*  --   --   --   --   BILITOT 0.7 0.7  --  0.9  --   --   --   --   ALBUMIN 2.7* 2.2*  < > 2.0*  < > 2.0* 1.8* 1.9*  < > = values in this interval not displayed.  Cardiac Enzymes  Recent Labs Lab 03/08/2017 1920 02/13/17 0134 02/13/17 0759  TROPONINI 0.07* 0.06* 0.06*    Glucose  Recent Labs Lab 02/13/17 0756 02/13/17 1117 02/13/17 1719 02/13/17 1951 Mar 10, 2017 0001 2017/03/10 0425  GLUCAP 116* 130* 125* 141* 120* 132*   CXR 5/6 : increased edema pattern  EKG: AF, vent rate 126, diffuse ST segment elevation improved   ASSESSMENT / PLAN:  Acute hypoxemic respiratory failure Pulmonary edema Wheezing with remote history of asthma Shock, presumed sepsis - resolved Abnormal EKG - possible pericarditis Minimally elevated trop I Mildly elevated BNP AF with RVR Anuric AKI  CKD Mildly abnormal LFTs Liver metastases Severe anemia without overt bleeding Metastatic high grade neuroendocrine cancer Thrombocytopenia Severe sepsis Pyuria (unfortunately, it appears that U cx was not performed) Hyperglycemia without prior history of DM Acute encephalopathy Anxiety   PLAN  Full comfort care-see EOL orders Cont supplemental O2 through HFNC No BiPAP D/C all pressors and antiarrhythmics  Foley for comfort D/c all labs Morphine gtte and prn lorazepam Spiritual care offered but family declined    Magdalene S. [facility] ANP-BC Pulmonary and Critical Care Medicine [facility] Pager [phone] or [phone]  03/10/2017, 5:27 AM

## 2017-03-11 NOTE — Progress Notes (Signed)
Pt expired at 12:32 with family and rn at bedside. Pt transition to comfort care per the family request this morning.   cds notified and issued a full release.

## 2017-03-11 NOTE — Progress Notes (Signed)
[place] responded to an OR for End of Life. Pt was resting with daughter in bed beside her. Several other family members were bedside. Family indicated that their church has provided a tremendous amount of support. [place] assessed that the family was in a good space. [facility] is available for follow up as needed.    03/09/2017 1500  Clinical Encounter Type  Visited With Patient;Patient and family together  Visit Type Initial;Spiritual support;Critical Care;Patient actively dying  Referral From Nurse  Consult/Referral To Chaplain  Spiritual Encounters  Spiritual Needs Emotional  Advance Directives (For Healthcare)  Type of Advance Directive Living will

## 2017-03-11 NOTE — Death Summary Note (Signed)
   DEATH SUMMARY   Patient Details  Name: Sharon Hester MRN: [id number] DOB: 26-Mar-1951  Admission/Discharge Information   Admit Date:  2017-03-03  Date of Death: Date of Death: 03-05-17  Time of Death: Time of Death: 01-27-1231  Length of Stay: 2  Referring Physician: Tower, Wynelle Fanny, MD   Reason(s) for Hospitalization  Resp failure and renal failure  Diagnoses  Preliminary cause of death: Metastatic Neuroendocrine Cancer, MI Secondary Diagnoses (including complications and co-morbidities):  Active Problems:   Acute respiratory failure (HCC)   AKI (acute kidney injury) [facility])   Brief Hospital Course (including significant findings, care, treatment, and services provided and events leading to death)  66 y.o. 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 Presented to ED via EMS with 3 days of progressive dyspnea, weakness, and altered mental status. On the morning of admission, she as in severe distress with profound lethargy. In the ED, she was profoundly hypotensive with multiple metabolic derangements and organ dysfunction. She received 3 units RBCs in ED for Hgb 7. Her EKG was consistent with STEMI but she was not deemed a candidate for LHC  Patient subsequently developed worsening resp and renal failure, patient was made DNR/DNI  And was made comfort care measures   Family are satisfied with Plan of action and management. All questions answered  Corrin Parker, M.D.  [facility] Pulmonary & Critical Care Medicine  Medical Director [place] Director [place] Mar 05, 2017, 1:35 PM

## 2017-03-11 NOTE — Progress Notes (Signed)
2030: Patient agitated/in pain, became progressively worse throughout the night. Called NP, got verbal orders to give pain prns more frequently.  0215: Gave additional one time dose of Fentanyl, added Lidocaine patch, repositioned every two hours.   0400: Had to transition patient from sublingual morphine to IV because no longer following commands.   0430: Patients respiratory rate decreased below 10, family meeting was initiated, decision made. Transitioning patient to comfort care.   0530: Turned off CRRT and amiodarone drip. Morphine drip initiated. CDS, E-link, and central telemetry all notified. Patients family at bedside. Will continue to monitor.

## 2017-03-11 DEATH — deceased

## 2017-04-12 NOTE — Telephone Encounter (Signed)
done
